# Patient Record
Sex: Male | Born: 1935 | Race: White | Hispanic: No | Marital: Married | Smoking: Never smoker
Health system: Southern US, Community
[De-identification: ages and names within clinical notes are randomized; demographics above are authoritative.]

## PROBLEM LIST (undated history)

## (undated) DIAGNOSIS — E785 Hyperlipidemia, unspecified: Secondary | ICD-10-CM

## (undated) DIAGNOSIS — F0280 Dementia in other diseases classified elsewhere without behavioral disturbance: Secondary | ICD-10-CM

## (undated) DIAGNOSIS — F0390 Unspecified dementia without behavioral disturbance: Secondary | ICD-10-CM

## (undated) DIAGNOSIS — I4891 Unspecified atrial fibrillation: Secondary | ICD-10-CM

## (undated) DIAGNOSIS — Z860100 Personal history of colon polyps, unspecified: Secondary | ICD-10-CM

## (undated) DIAGNOSIS — I10 Essential (primary) hypertension: Secondary | ICD-10-CM

## (undated) DIAGNOSIS — D696 Thrombocytopenia, unspecified: Secondary | ICD-10-CM

## (undated) DIAGNOSIS — N189 Chronic kidney disease, unspecified: Secondary | ICD-10-CM

## (undated) DIAGNOSIS — E039 Hypothyroidism, unspecified: Secondary | ICD-10-CM

## (undated) DIAGNOSIS — N400 Enlarged prostate without lower urinary tract symptoms: Secondary | ICD-10-CM

## (undated) DIAGNOSIS — G309 Alzheimer's disease, unspecified: Secondary | ICD-10-CM

## (undated) HISTORY — DX: Personal history of colon polyps, unspecified: Z86.0100

## (undated) HISTORY — DX: Unspecified atrial fibrillation: I48.91

## (undated) HISTORY — DX: Hyperlipidemia, unspecified: E78.5

## (undated) HISTORY — DX: Personal history of colonic polyps: Z86.010

## (undated) HISTORY — DX: Unspecified dementia, unspecified severity, without behavioral disturbance, psychotic disturbance, mood disturbance, and anxiety: F03.90

## (undated) HISTORY — DX: Hypothyroidism, unspecified: E03.9

## (undated) HISTORY — DX: Benign prostatic hyperplasia without lower urinary tract symptoms: N40.0

## (undated) HISTORY — PX: APPENDECTOMY: SHX54

## (undated) HISTORY — DX: Essential (primary) hypertension: I10

## (undated) HISTORY — PX: PROSTATECTOMY: SHX69

## (undated) HISTORY — DX: Thrombocytopenia, unspecified: D69.6

---

## 1997-11-30 ENCOUNTER — Ambulatory Visit (HOSPITAL_COMMUNITY): Admission: RE | Admit: 1997-11-30 | Discharge: 1997-11-30 | Payer: Self-pay | Admitting: Neurosurgery

## 1997-12-14 ENCOUNTER — Ambulatory Visit (HOSPITAL_COMMUNITY): Admission: RE | Admit: 1997-12-14 | Discharge: 1997-12-14 | Payer: Self-pay | Admitting: Neurosurgery

## 1998-01-04 ENCOUNTER — Ambulatory Visit (HOSPITAL_COMMUNITY): Admission: RE | Admit: 1998-01-04 | Discharge: 1998-01-04 | Payer: Self-pay | Admitting: Family Medicine

## 1998-01-15 ENCOUNTER — Encounter: Admission: RE | Admit: 1998-01-15 | Discharge: 1998-04-15 | Payer: Self-pay | Admitting: Family Medicine

## 1998-01-20 ENCOUNTER — Ambulatory Visit (HOSPITAL_COMMUNITY): Admission: RE | Admit: 1998-01-20 | Discharge: 1998-01-20 | Payer: Self-pay | Admitting: Gastroenterology

## 2001-01-16 ENCOUNTER — Encounter: Admission: RE | Admit: 2001-01-16 | Discharge: 2001-01-16 | Payer: Self-pay | Admitting: Family Medicine

## 2001-01-16 ENCOUNTER — Encounter: Payer: Self-pay | Admitting: Family Medicine

## 2002-02-08 ENCOUNTER — Inpatient Hospital Stay (HOSPITAL_COMMUNITY): Admission: EM | Admit: 2002-02-08 | Discharge: 2002-02-11 | Payer: Self-pay | Admitting: Emergency Medicine

## 2002-02-09 ENCOUNTER — Encounter: Payer: Self-pay | Admitting: Family Medicine

## 2002-02-10 ENCOUNTER — Encounter: Payer: Self-pay | Admitting: Family Medicine

## 2003-06-20 LAB — HM COLONOSCOPY

## 2003-11-01 ENCOUNTER — Emergency Department (HOSPITAL_COMMUNITY): Admission: EM | Admit: 2003-11-01 | Discharge: 2003-11-01 | Payer: Self-pay | Admitting: Emergency Medicine

## 2007-11-13 ENCOUNTER — Encounter: Admission: RE | Admit: 2007-11-13 | Discharge: 2007-11-13 | Payer: Self-pay | Admitting: Family Medicine

## 2008-08-26 ENCOUNTER — Encounter: Payer: Self-pay | Admitting: Internal Medicine

## 2008-10-07 ENCOUNTER — Encounter: Payer: Self-pay | Admitting: Internal Medicine

## 2008-10-21 ENCOUNTER — Encounter: Payer: Self-pay | Admitting: Internal Medicine

## 2008-12-21 ENCOUNTER — Ambulatory Visit: Payer: Self-pay | Admitting: Cardiology

## 2008-12-21 ENCOUNTER — Ambulatory Visit: Payer: Self-pay | Admitting: Critical Care Medicine

## 2008-12-21 ENCOUNTER — Inpatient Hospital Stay (HOSPITAL_COMMUNITY): Admission: EM | Admit: 2008-12-21 | Discharge: 2008-12-28 | Payer: Self-pay | Admitting: Emergency Medicine

## 2008-12-23 ENCOUNTER — Encounter: Payer: Self-pay | Admitting: Cardiology

## 2008-12-24 ENCOUNTER — Encounter: Payer: Self-pay | Admitting: Cardiology

## 2009-01-02 ENCOUNTER — Emergency Department (HOSPITAL_COMMUNITY): Admission: EM | Admit: 2009-01-02 | Discharge: 2009-01-02 | Payer: Self-pay | Admitting: Emergency Medicine

## 2009-02-18 ENCOUNTER — Encounter (INDEPENDENT_AMBULATORY_CARE_PROVIDER_SITE_OTHER): Payer: Self-pay | Admitting: Urology

## 2009-02-18 ENCOUNTER — Inpatient Hospital Stay (HOSPITAL_COMMUNITY): Admission: RE | Admit: 2009-02-18 | Discharge: 2009-02-20 | Payer: Self-pay | Admitting: Urology

## 2009-06-08 ENCOUNTER — Encounter: Payer: Self-pay | Admitting: Internal Medicine

## 2009-06-09 ENCOUNTER — Ambulatory Visit: Payer: Self-pay | Admitting: Internal Medicine

## 2009-06-09 DIAGNOSIS — Z860100 Personal history of colon polyps, unspecified: Secondary | ICD-10-CM

## 2009-06-09 DIAGNOSIS — G309 Alzheimer's disease, unspecified: Secondary | ICD-10-CM

## 2009-06-09 DIAGNOSIS — I4891 Unspecified atrial fibrillation: Secondary | ICD-10-CM

## 2009-06-09 DIAGNOSIS — F0390 Unspecified dementia without behavioral disturbance: Secondary | ICD-10-CM | POA: Insufficient documentation

## 2009-06-09 DIAGNOSIS — E785 Hyperlipidemia, unspecified: Secondary | ICD-10-CM | POA: Insufficient documentation

## 2009-06-09 DIAGNOSIS — E119 Type 2 diabetes mellitus without complications: Secondary | ICD-10-CM | POA: Insufficient documentation

## 2009-06-09 DIAGNOSIS — I10 Essential (primary) hypertension: Secondary | ICD-10-CM

## 2009-06-09 DIAGNOSIS — F0280 Dementia in other diseases classified elsewhere without behavioral disturbance: Secondary | ICD-10-CM | POA: Insufficient documentation

## 2009-06-09 HISTORY — DX: Personal history of colonic polyps: Z86.010

## 2009-06-09 HISTORY — DX: Personal history of colon polyps, unspecified: Z86.0100

## 2009-06-09 LAB — CONVERTED CEMR LAB
BUN: 17 mg/dL (ref 6–23)
Basophils Relative: 0.8 % (ref 0.0–3.0)
Bilirubin, Direct: 0.1 mg/dL (ref 0.0–0.3)
Chloride: 107 meq/L (ref 96–112)
Cholesterol: 155 mg/dL (ref 0–200)
Eosinophils Relative: 2.5 % (ref 0.0–5.0)
HCT: 44.3 % (ref 39.0–52.0)
LDL Cholesterol: 74 mg/dL (ref 0–99)
Lymphs Abs: 1.2 10*3/uL (ref 0.7–4.0)
MCV: 94.4 fL (ref 78.0–100.0)
Monocytes Absolute: 0.3 10*3/uL (ref 0.1–1.0)
Platelets: 109 10*3/uL — ABNORMAL LOW (ref 150.0–400.0)
Potassium: 4.1 meq/L (ref 3.5–5.1)
Total Bilirubin: 1 mg/dL (ref 0.3–1.2)
Total CHOL/HDL Ratio: 3
Total Protein: 6.5 g/dL (ref 6.0–8.3)
VLDL: 21.8 mg/dL (ref 0.0–40.0)
WBC: 5 10*3/uL (ref 4.5–10.5)

## 2009-06-14 ENCOUNTER — Encounter: Payer: Self-pay | Admitting: Internal Medicine

## 2009-06-16 ENCOUNTER — Telehealth: Payer: Self-pay | Admitting: Internal Medicine

## 2009-07-02 ENCOUNTER — Ambulatory Visit: Payer: Self-pay | Admitting: Internal Medicine

## 2009-09-23 ENCOUNTER — Ambulatory Visit: Payer: Self-pay | Admitting: Internal Medicine

## 2009-09-23 LAB — CONVERTED CEMR LAB
AST: 21 units/L (ref 0–37)
Alkaline Phosphatase: 60 units/L (ref 39–117)
GFR calc non Af Amer: 56.37 mL/min (ref >60)
LDL Cholesterol: 61 mg/dL (ref 0–99)
Potassium: 4 meq/L (ref 3.5–5.1)
Sodium: 145 meq/L (ref 135–145)
Total Bilirubin: 0.7 mg/dL (ref 0.3–1.2)
Total CHOL/HDL Ratio: 2
VLDL: 20.2 mg/dL (ref 0.0–40.0)

## 2009-09-30 ENCOUNTER — Ambulatory Visit: Payer: Self-pay | Admitting: Internal Medicine

## 2010-01-27 ENCOUNTER — Ambulatory Visit: Payer: Self-pay | Admitting: Internal Medicine

## 2010-01-27 LAB — CONVERTED CEMR LAB
Albumin: 4 g/dL (ref 3.5–5.2)
Alkaline Phosphatase: 66 units/L (ref 39–117)
Bilirubin, Direct: 0.2 mg/dL (ref 0.0–0.3)
Calcium: 9.2 mg/dL (ref 8.4–10.5)
GFR calc non Af Amer: 64.24 mL/min (ref >60)
Glucose, Bld: 183 mg/dL — ABNORMAL HIGH (ref 70–99)
HDL: 51.2 mg/dL (ref >39.00)
Sodium: 145 meq/L (ref 135–145)
Total CHOL/HDL Ratio: 3
VLDL: 26.6 mg/dL (ref 0.0–40.0)

## 2010-02-03 ENCOUNTER — Ambulatory Visit: Payer: Self-pay | Admitting: Internal Medicine

## 2010-02-26 ENCOUNTER — Emergency Department (HOSPITAL_COMMUNITY): Admission: EM | Admit: 2010-02-26 | Discharge: 2010-02-26 | Payer: Self-pay | Admitting: Emergency Medicine

## 2010-03-07 ENCOUNTER — Emergency Department (HOSPITAL_COMMUNITY)
Admission: EM | Admit: 2010-03-07 | Discharge: 2010-03-07 | Payer: Self-pay | Source: Home / Self Care | Admitting: Family Medicine

## 2010-04-28 ENCOUNTER — Ambulatory Visit: Payer: Self-pay | Admitting: Internal Medicine

## 2010-04-28 LAB — CONVERTED CEMR LAB
AST: 25 units/L (ref 0–37)
Albumin: 4.2 g/dL (ref 3.5–5.2)
CO2: 28 meq/L (ref 19–32)
Calcium: 9.6 mg/dL (ref 8.4–10.5)
Cholesterol: 122 mg/dL (ref 0–200)
GFR calc non Af Amer: 55.79 mL/min (ref >60)
HDL: 45.6 mg/dL (ref >39.00)
Hgb A1c MFr Bld: 6 % (ref 4.6–6.5)
Sodium: 146 meq/L — ABNORMAL HIGH (ref 135–145)
Total CHOL/HDL Ratio: 3
Total Protein: 6.2 g/dL (ref 6.0–8.3)
Triglycerides: 93 mg/dL (ref 0.0–149.0)

## 2010-05-05 ENCOUNTER — Ambulatory Visit: Payer: Self-pay | Admitting: Internal Medicine

## 2010-07-21 NOTE — Assessment & Plan Note (Signed)
Summary: 4 month fup//ccm   Vital Signs:  Patient profile:   77 year old male Weight:      217 pounds Temp:     97.8 degrees F oral Pulse rate:   64 / minute Pulse rhythm:   regular Resp:     12 per minute BP sitting:   132 / 84  (left arm) Cuff size:   regular  Vitals Entered By: Gladis Riffle, RN (February 03, 2010 10:01 AM) CC: 4 month rov-- Is Patient Diabetic? Yes Did you bring your meter with you today? No Comments wife advised to take control of medications and use daily pill box--CBGs 250 average at home   CC:  4 month rov--.  History of Present Illness:  Follow-Up Visit      This is an Robert Salinas who presents for Follow-up visit.  The patient denies chest pain and palpitations.  Since the last visit the patient notes no new problems or concerns.  The patient reports not taking meds as prescribed.  When questioned about possible medication side effects, the patient notes none.  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 Take 1 Tablet By Mouth Two Times A Day 2)  Lisinopril 20 Mg Tabs (Lisinopril) .Marland Kitchen.. 1 Tablet By Mouth Daily 3)  Synthroid 75 Mcg Tabs (Levothyroxine Sodium) .Marland Kitchen.. 1 By Mouth Once Daily 4)  Lipitor 10 Mg Tabs (Atorvastatin Calcium) .Marland Kitchen.. 1 By Mouth  Daily 5)  Aricept 5 Mg Tabs (Donepezil Hydrochloride) .Marland Kitchen.. 1 By Mouth Daily 6)  Aleve 220 Mg Tabs (Naproxen Sodium) .... As Needed 7)  Glimepiride 2 Mg Tabs (Glimepiride) .... Take 1 Tablet By Mouth Once A Day  Allergies (verified): No Known Drug Allergies  Past History:  Past Medical History: Last updated: 06/09/2009 Chickenpox Colonic polyps, hx of Diabetes mellitus, type II Hyperlipidemia Hypertension Early stages of Alzheimers  1. Escherichia coli bacteremia with initial positive blood culture on       December 21, 2008, and negative repeat blood culture x2 on December 25, 2008.   2. Escherichia coli urinary tract infection.   3. Urinary retention .    atrial fibrillation that evaluated by Cardiology       and the patient does not need Coumadin at this time.   5. Hypothyroidism.   7. Thrombocytopenia with no evidence of bleeding.   10.Dementia remains stable.   11.Benign prostatic hypertrophy.   12.Diabetes mellitus.   13.Hypertension.   14.Hyperlipidemia Atrial fibrillation  Past Surgical History: Last updated: 06/09/2009 Appendectomy Prostatectomy-TURP  Family History: Last updated: 06/09/2009 Family History Breast cancer 1st degree relative <50 Family History Diabetes 1st degree relative Family History High cholesterol Family History Hypertension  Social History: Last updated: 06/09/2009 Retired Married Never Smoked Alcohol use-no Drug use-no Regular exercise-no  Risk Factors: Exercise: no (06/09/2009)  Risk Factors: Smoking Status: never (02/03/2010)  Physical Exam  General:  Well-developed,well-nourished,in no acute distress; alert,appropriate and cooperative throughout examination Head:  normocephalic and atraumatic.   Eyes:  pupils equal and pupils round.   Ears:  R ear normal and L ear normal.   Neck:  No deformities, masses, or tenderness noted. Lungs:  normal respiratory effort and no intercostal retractions.   Heart:  normal rate and regular  rhythm.   Abdomen:  soft and non-tender.   Msk:  No deformity or scoliosis noted of thoracic or lumbar spine.   Neurologic:  cranial nerves II-XII intact and gait normal.   Skin:  turgor normal and color normal.     Impression & Recommendations:  Problem # 1:  ATRIAL FIBRILLATION (ICD-427.31) on exam he is in sinus rhtym not a candidate for anticoagualtion---confuses medications advised asa qd  Problem # 2:  HYPERLIPIDEMIA (ICD-272.4) controlled continue current medications  His updated medication list for this problem includes:    Lipitor 10 Mg Tabs (Atorvastatin calcium) .Marland Kitchen... 1 by mouth daily  Labs Reviewed: SGOT: 19 (01/27/2010)   SGPT: 15 (01/27/2010)   HDL:51.20 (01/27/2010), 57.60 (09/23/2009)  LDL:98 (01/27/2010), 61 (09/23/2009)  Chol:176 (01/27/2010), 139 (09/23/2009)  Trig:133.0 (01/27/2010), 101.0 (09/23/2009)  Problem # 3:  DIABETES MELLITUS, TYPE II (ICD-250.00) controlled continue current medications  His updated medication list for this problem includes:    Metformin Hcl 1000 Mg Tabs (Metformin hcl) .Marland Kitchen... Take 1 tablet by mouth two times a day    Lisinopril 20 Mg Tabs (Lisinopril) .Marland Kitchen... 1 tablet by mouth daily    Glimepiride 2 Mg Tabs (Glimepiride) .Marland Kitchen... Take 1 tablet by mouth once a day  Labs Reviewed: Creat: 1.2 (01/27/2010)    Reviewed HgBA1c results: 7.2 (01/27/2010)  6.7 (09/23/2009)  Complete Medication List: 1)  Metformin Hcl 1000 Mg Tabs (Metformin hcl) .... Take 1 tablet by mouth two times a day 2)  Lisinopril 20 Mg Tabs (Lisinopril) .Marland Kitchen.. 1 tablet by mouth daily 3)  Synthroid 75 Mcg Tabs (Levothyroxine sodium) .Marland Kitchen.. 1 by mouth once daily 4)  Lipitor 10 Mg Tabs (Atorvastatin calcium) .Marland Kitchen.. 1 by mouth daily 5)  Aricept 5 Mg Tabs (Donepezil hydrochloride) .Marland Kitchen.. 1 by mouth daily 6)  Aleve 220 Mg Tabs (Naproxen sodium) .... As needed 7)  Glimepiride 2 Mg Tabs (Glimepiride) .... Take 1 tablet by mouth once a day  Patient Instructions: 1)  Please schedule  a follow-up appointment in 3 months. 2)  labs one week prior to visit 3)  lipids---272.4 4)  lfts-995.2 5)  bmet-995.2 6)  A1C-250.02 7)    8)  tsh--244.9 Prescriptions: LIPITOR 10 MG TABS (ATORVASTATIN CALCIUM) 1 by mouth daily  #90 x 3   Entered and Authorized by:   Birdie Sons MD   Signed by:   Birdie Sons MD on 02/03/2010   Method used:   Faxed to ...       [facility] (mail-order)             , [place]         Ph: [phone]       Fax: [phone]   RxID:   [id number] SYNTHROID 75 MCG TABS (LEVOTHYROXINE SODIUM) 1 by mouth once daily  #90 x 3   Entered and Authorized by:   Birdie Sons MD   Signed by:   Birdie Sons MD on 02/03/2010   Method used:   Faxed to ...       [facility] (mail-order)             ,  [place]         Ph: [phone]       Fax: [phone]   RxID:   [id number]

## 2010-07-21 NOTE — Letter (Signed)
Summary: [facility]  [facility]   Imported By: Maryln Gottron 08/02/2009 15:38:58  _____________________________________________________________________  External Attachment:    Type:   Image     Comment:   External Document

## 2010-07-21 NOTE — Assessment & Plan Note (Signed)
Summary: 3 month fup///cm   Vital Signs:  Patient profile:   77 year old male Weight:      219 pounds Temp:     97.8 degrees F oral Pulse rate:   68 / minute Pulse rhythm:   regular BP sitting:   136 / 88  (left arm) Cuff size:   large  Vitals Entered By: Alfred Levins, CMA (May 05, 2010 9:40 AM) CC: f/u on labs   CC:  f/u on labs.  History of Present Illness:  Follow-Up Visit: patient is here with wife      This is an 11 year old man who presents for Follow-up visit.  The patient denies chest pain and palpitations.  Since the last visit the patient notes no new problems or concerns.  The patient reports taking meds as prescribed.  When questioned about possible medication side effects, the patient notes none.    no other complaints in a complete review of systems  Current Medications (verified): 1)  Metformin Hcl 1000 Mg Tabs (Metformin Hcl) .... Take 1 Tablet By Mouth Two Times A Day 2)  Lisinopril 20 Mg Tabs (Lisinopril) .Marland Kitchen.. 1 Tablet By Mouth Daily 3)  Synthroid 75 Mcg Tabs (Levothyroxine Sodium) .Marland Kitchen.. 1 By Mouth Once Daily 4)  Lipitor 10 Mg Tabs (Atorvastatin Calcium) .Marland Kitchen.. 1 By Mouth Daily 5)  Aricept 5 Mg Tabs (Donepezil Hydrochloride) .Marland Kitchen.. 1 By Mouth Daily 6)  Aleve 220 Mg Tabs (Naproxen Sodium) .... As Needed 7)  Glimepiride 2 Mg Tabs (Glimepiride) .... Take 1 Tablet By Mouth Once A Day  Allergies (verified): No Known Drug Allergies  Past History:  Past Medical History: Last updated: 06/09/2009 Chickenpox Colonic polyps, hx of Diabetes mellitus, type II Hyperlipidemia Hypertension Early stages of Alzheimers  1. Escherichia coli bacteremia with initial positive blood culture on       December 21, 2008, and negative repeat blood culture x2 on December 25, 2008.   2. Escherichia coli urinary tract infection.   3. Urinary retention .    atrial fibrillation that evaluated by Cardiology       and the patient does not need Coumadin at this time.   5. Hypothyroidism.   7. Thrombocytopenia with no evidence of bleeding.   10.Dementia remains stable.   11.Benign prostatic hypertrophy.   12.Diabetes mellitus.   13.Hypertension.   14.Hyperlipidemia Atrial fibrillation  Past Surgical History: Last updated: 06/09/2009 Appendectomy Prostatectomy-TURP  Family History: Last updated: 06/09/2009 Family History Breast cancer 1st degree relative <50 Family History Diabetes 1st degree relative Family History High cholesterol Family History Hypertension  Social History: Last updated: 06/09/2009 Retired Married Never Smoked Alcohol use-no Drug use-no Regular exercise-no  Risk Factors: Exercise: no (06/09/2009)  Risk Factors: Smoking Status: never (02/03/2010)  Physical Exam  General:  elderly male in no acute distress. HEENT exam atraumatic, normocephalic, neck supple without lymphadenopathy chest clear to auscultation cardiac exam S1-S2 are regular. Abdominal exam overweight, to bowel sounds, soft. Extremities there is no clubbing cyanosis or edema. Neurologic exam is alert no motor or sensory deficits identified.   Impression & Recommendations:  Problem # 1:  ATRIAL FIBRILLATION (ICD-427.31)  His updated medication list for this problem includes:    Aspirin 81 Mg Tbec (Aspirin) ..... One by mouth every day  on exam he is in sinus rhtym not a candidate for anticoagualtion---confuses medications advised asa qd  Problem # 2:  HYPERTENSION (ICD-401.9) adequate control. Continue current medications. His updated medication list for this problem includes:  Lisinopril 20 Mg Tabs (Lisinopril) .Marland Kitchen... 1 tablet by mouth daily  BP today: 136/88 Prior BP: 132/84 (02/03/2010)  Labs Reviewed: K+: 4.7 (04/28/2010) Creat: : 1.3 (04/28/2010)   Chol: 122 (04/28/2010)   HDL: 45.60 (04/28/2010)   LDL: 58 (04/28/2010)   TG: 93.0 (04/28/2010)  Problem # 3:  HYPERLIPIDEMIA (ICD-272.4) adequate control. Continue current medications. His updated medication  list for this problem includes:    Lipitor 10 Mg Tabs (Atorvastatin calcium) .Marland Kitchen... 1 by mouth daily  Labs Reviewed: SGOT: 25 (04/28/2010)   SGPT: 18 (04/28/2010)   HDL:45.60 (04/28/2010), 51.20 (01/27/2010)  LDL:58 (04/28/2010), 98 (01/27/2010)  Chol:122 (04/28/2010), 176 (01/27/2010)  Trig:93.0 (04/28/2010), 133.0 (01/27/2010)  Problem # 4:  DIABETES MELLITUS, TYPE II (ICD-250.00) controlled. Continue current medications. His updated medication list for this problem includes:    Metformin Hcl 1000 Mg Tabs (Metformin hcl) .Marland Kitchen... Take 1 tablet by mouth two times a day    Lisinopril 20 Mg Tabs (Lisinopril) .Marland Kitchen... 1 tablet by mouth daily    Glimepiride 2 Mg Tabs (Glimepiride) .Marland Kitchen... Take 1 tablet by mouth once a day    Aspirin 81 Mg Tbec (Aspirin) ..... One by mouth every day  Labs Reviewed: Creat: 1.3 (04/28/2010)    Reviewed HgBA1c results: 6.0 (04/28/2010)  7.2 (01/27/2010)  Complete Medication List: 1)  Metformin Hcl 1000 Mg Tabs (Metformin hcl) .... Take 1 tablet by mouth two times a day 2)  Lisinopril 20 Mg Tabs (Lisinopril) .Marland Kitchen.. 1 tablet by mouth daily 3)  Synthroid 75 Mcg Tabs (Levothyroxine sodium) .Marland Kitchen.. 1 by mouth once daily 4)  Lipitor 10 Mg Tabs (Atorvastatin calcium) .Marland Kitchen.. 1 by mouth daily 5)  Aricept 5 Mg Tabs (Donepezil hydrochloride) .Marland Kitchen.. 1 by mouth daily 6)  Aleve 220 Mg Tabs (Naproxen sodium) .... As needed 7)  Glimepiride 2 Mg Tabs (Glimepiride) .... Take 1 tablet by mouth once a day 8)  Aspirin 81 Mg Tbec (Aspirin) .... One by mouth every day  Patient Instructions: 1)  Please schedule a follow-up appointment in 4 months. 2)  labs one week prior to visit 3)  lipids---272.4 4)  lfts-995.2 5)  bmet-995.2 6)  A1C-250.02 7)       Orders Added: 1)  Est. Patient Level IV [04540]

## 2010-07-21 NOTE — Op Note (Signed)
Summary: Ultrasound and Needle Biopsy of Prostate/[facility]  Ultrasound and Needle Biopsy of Prostate/[facility]   Imported By: Maryln Gottron 08/02/2009 15:40:36  _____________________________________________________________________  External Attachment:    Type:   Image     Comment:   External Document

## 2010-07-21 NOTE — Letter (Signed)
Summary: [facility]  [facility]   Imported By: Maryln Gottron 07/01/2009 12:47:34  _____________________________________________________________________  External Attachment:    Type:   Image     Comment:   External Document

## 2010-07-21 NOTE — Assessment & Plan Note (Signed)
Summary: 3 wk rov/njr rsc bmp per wife/njr   Vital Signs:  Patient profile:   77 year old male Weight:      206 pounds Temp:     97.7 degrees F Pulse rate:   68 / minute Resp:     12 per minute BP sitting:   162 / 70  (left arm)  Vitals Entered By: Gladis Riffle, RN (July 02, 2009 1:48 PM)  Serial Vital Signs/Assessments:  Time      Position  BP       Pulse  Resp  Temp     By                     134/78                         Birdie Sons MD    Preventive Screening-Counseling & Management  Alcohol-Tobacco     Smoking Status: never  Current Problems (verified): 1)  Atrial Fibrillation  (ICD-427.31) 2)  Alzheimer's Disease, 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 Take 1 Tablet By Mouth Two Times A Day 2)  Lisinopril 10 Mg  Tabs (Lisinopril) .... Take 1 Tab By Mouth Daily 3)  Synthroid 75 Mcg Tabs (Levothyroxine Sodium) .Marland Kitchen.. 1 By Mouth Once Daily 4)  Lipitor 10 Mg Tabs (Atorvastatin Calcium) .Marland Kitchen.. 1 By Mouth Daily 5)  Aricept 5 Mg Tabs (Donepezil Hydrochloride) .Marland Kitchen.. 1 By Mouth Daily 6)  Aleve 220 Mg Tabs (Naproxen Sodium) .... As Needed  Allergies (verified): No Known Drug Allergies  Comments:  Nurse/Medical Assistant: 3 week rov, labs done--BP 135/70 at home  The patient's medications and allergies were reviewed with the patient and were updated in the Medication and Allergy Lists. Gladis Riffle, RN (July 02, 2009 1:49 PM)  Past History:  Past Medical History: Last updated: 06/09/2009 Chickenpox Colonic polyps, hx of Diabetes mellitus, type II Hyperlipidemia Hypertension Early stages of Alzheimers  1. Escherichia coli  bacteremia with initial positive blood culture on       December 21, 2008, and negative repeat blood culture x2 on December 25, 2008.   2. Escherichia coli urinary tract infection.   3. Urinary retention .    atrial fibrillation that evaluated by Cardiology       and the patient does not need Coumadin at this time.   5. Hypothyroidism.   7. Thrombocytopenia with no evidence of bleeding.   10.Dementia remains stable.   11.Benign prostatic hypertrophy.   12.Diabetes mellitus.   13.Hypertension.   14.Hyperlipidemia Atrial fibrillation  Past Surgical History: Last updated: 06/09/2009 Appendectomy Prostatectomy-TURP  Family History: Last updated: 06/09/2009 Family History Breast cancer 1st degree relative <50 Family History Diabetes 1st degree relative Family History High cholesterol Family History Hypertension  Social History: Last updated: 06/09/2009 Retired Married Never Smoked Alcohol use-no Drug use-no Regular exercise-no  Risk Factors: Exercise: no (06/09/2009)  Risk Factors: Smoking Status: never (07/02/2009)  Review of Systems       All other systems reviewed and were negative   Physical Exam  General:  Well-developed,well-nourished,in no acute distress; alert,appropriate and cooperative throughout examination Head:  normocephalic and atraumatic.   Eyes:  pupils equal and pupils round.   Ears:  R ear normal and L ear normal.   Neck:  No deformities, masses, or tenderness noted. Chest Wall:  No deformities, masses, tenderness or gynecomastia noted. Lungs:  Normal respiratory effort, chest expands symmetrically. Lungs are clear to auscultation, no crackles or wheezes. Abdomen:  Bowel sounds positive,abdomen soft and non-tender without masses, organomegaly or hernias noted. Msk:  No deformity or scoliosis noted of thoracic or lumbar spine.   Neurologic:  cranial nerves II-XII intact and gait normal.     Impression & Recommendations:  Problem # 1:  HYPERTENSION  (ICD-401.9) he will begin to monitor His updated medication list for this problem includes:    Lisinopril 10 Mg Tabs (Lisinopril) .Marland Kitchen... Take 1 tab by mouth daily  BP today: 162/70 Prior BP: 178/90 (06/09/2009)  Labs Reviewed: K+: 4.1 (06/09/2009) Creat: : 1.3 (06/09/2009)   Chol: 155 (06/09/2009)   HDL: 59.00 (06/09/2009)   LDL: 74 (06/09/2009)   TG: 109.0 (06/09/2009)  Problem # 2:  HYPERLIPIDEMIA (ICD-272.4) adequate control His updated medication list for this problem includes:    Lipitor 10 Mg Tabs (Atorvastatin calcium) .Marland Kitchen... 1 by mouth daily  Labs Reviewed: SGOT: 19 (06/09/2009)   SGPT: 15 (06/09/2009)   HDL:59.00 (06/09/2009)  LDL:74 (06/09/2009)  Chol:155 (06/09/2009)  Trig:109.0 (06/09/2009)  Problem # 3:  DIABETES MELLITUS, TYPE II (ICD-250.00) controlled continue current medications  His updated medication list for this problem includes:    Metformin Hcl 1000 Mg Tabs (Metformin hcl) .Marland Kitchen... Take 1 tablet by mouth two times a day    Lisinopril 10 Mg Tabs (Lisinopril) .Marland Kitchen... Take 1 tab by mouth daily  Labs Reviewed: Creat: 1.3 (06/09/2009)    Reviewed HgBA1c results: 6.4 (06/09/2009)  Problem # 4:  ALZHEIMER'S DISEASE, EARLY (ICD-331.0) stable  Complete Medication List: 1)  Metformin Hcl 1000 Mg Tabs (Metformin hcl) .... Take 1 tablet by mouth two times a day 2)  Lisinopril 10 Mg Tabs (Lisinopril) .... Take 1 tab by mouth daily 3)  Synthroid 75 Mcg Tabs (Levothyroxine sodium) .Marland Kitchen.. 1 by mouth once daily 4)  Lipitor 10 Mg Tabs (Atorvastatin calcium) .Marland Kitchen.. 1 by mouth daily 5)  Aricept 5 Mg Tabs (Donepezil hydrochloride) .Marland Kitchen.. 1 by mouth daily 6)  Aleve 220 Mg Tabs (Naproxen sodium) .... As needed  Patient Instructions: 1)  Please schedule a follow-up appointment in 3 months. 2)  labs one week prior to visit 3)  lipids---272.4 4)  lfts-995.2 5)  bmet-995.2 6)  A1C-250.02 7)     Prescriptions: LISINOPRIL 10 MG  TABS (LISINOPRIL) Take 1 tab by mouth daily  #90 x 3    Entered by:   Gladis Riffle, RN   Authorized by:   Birdie Sons MD   Signed by:   Gladis Riffle, RN on 07/12/2009   Method used:   Electronically to        [facility] (mail-order)             ,          Ph: [phone]       Fax: [phone]   RxID:   [id number]

## 2010-07-21 NOTE — Assessment & Plan Note (Signed)
Summary: 3 month rov/njr   Vital Signs:  Patient profile:   77 year old male Weight:      212 pounds BMI:     30.10 Temp:     97.7 degrees F oral Pulse rate:   64 / minute Pulse rhythm:   regular Resp:     14 per minute BP sitting:   172 / 60  (left arm) Cuff size:   regular  Vitals Entered By: Gladis Riffle, RN (September 30, 2009 9:56 AM) CC: 3 month rov, labs done Is Patient Diabetic? No   CC:  3 month rov and labs done.  History of Present Illness:  Follow-Up Visit      This is an 77 year old man who presents for Follow-up visit.  The patient denies chest pain and palpitations.  Since the last visit the patient notes no new problems or concerns.  The patient reports taking meds as prescribed.  When questioned about possible medication side effects, the patient notes none.  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 Take 1 Tablet By Mouth Two Times A Day 2)  Lisinopril 10 Mg  Tabs (Lisinopril) .... Take 1 Tab By Mouth Daily 3)  Synthroid 75 Mcg Tabs (Levothyroxine Sodium) .Marland Kitchen.. 1 By Mouth Once Daily 4)  Lipitor 10 Mg Tabs (Atorvastatin Calcium) .Marland Kitchen.. 1 By Mouth Daily 5)  Aricept 5 Mg Tabs (Donepezil Hydrochloride) .Marland Kitchen.. 1 By Mouth Daily 6)  Aleve 220 Mg Tabs (Naproxen Sodium) .... As Needed  Allergies (verified): No Known Drug Allergies  Past History:  Past Medical History: Last updated:  06/09/2009 Chickenpox Colonic polyps, hx of Diabetes mellitus, type II Hyperlipidemia Hypertension Early stages of Alzheimers  1. Escherichia coli bacteremia with initial positive blood culture on       December 21, 2008, and negative repeat blood culture x2 on December 25, 2008.   2. Escherichia coli urinary tract infection.   3. Urinary retention .    atrial fibrillation that evaluated by Cardiology       and the patient does not need Coumadin at this time.   5. Hypothyroidism.   7. Thrombocytopenia with no evidence of bleeding.   10.Dementia remains stable.   11.Benign prostatic hypertrophy.   12.Diabetes mellitus.   13.Hypertension.   14.Hyperlipidemia Atrial fibrillation  Past Surgical History: Last updated: 06/09/2009 Appendectomy Prostatectomy-TURP  Family History: Last updated: 06/09/2009 Family History Breast cancer 1st degree relative <50 Family History Diabetes 1st degree relative Family History High cholesterol Family History Hypertension  Social History: Last updated: 06/09/2009 Retired Married Never Smoked Alcohol use-no Drug use-no Regular exercise-no  Risk Factors: Exercise: no (06/09/2009)  Risk Factors: Smoking Status: never (09/30/2009)  Physical Exam  General:  Well-developed,well-nourished,in no acute distress; alert,appropriate and cooperative throughout examination Head:  normocephalic and atraumatic.   Eyes:  pupils equal and pupils round.   Ears:  R ear normal and L ear normal.   Neck:  No deformities,  masses, or tenderness noted. Chest Wall:  No deformities, masses, tenderness or gynecomastia noted. Lungs:  Normal respiratory effort, chest expands symmetrically. Lungs are clear to auscultation, no crackles or wheezes. Abdomen:  Bowel sounds positive,abdomen soft and non-tender without masses, organomegaly or hernias noted. Msk:  No deformity or scoliosis noted of thoracic or lumbar spine.   Neurologic:  cranial nerves II-XII intact and gait  normal.     Impression & Recommendations:  Problem # 1:  DIABETES MELLITUS, TYPE II (ICD-250.00) controlled continue current medications  His updated medication list for this problem includes:    Metformin Hcl 1000 Mg Tabs (Metformin hcl) .Marland Kitchen... Take 1 tablet by mouth two times a day    Lisinopril 20 Mg Tabs (Lisinopril) .Marland Kitchen... 1 tablet by mouth daily  Labs Reviewed: Creat: 1.3 (09/23/2009)    Reviewed HgBA1c results: 6.7 (09/23/2009)  6.4 (06/09/2009)  Problem # 2:  HYPERTENSION (ICD-401.9) Assessment: Deteriorated see new dose side effects discussed His updated medication list for this problem includes:    Lisinopril 20 Mg Tabs (Lisinopril) .Marland Kitchen... 1 tablet by mouth daily  BP today: 172/60 Prior BP: 162/70 (07/02/2009)  Labs Reviewed: K+: 4.0 (09/23/2009) Creat: : 1.3 (09/23/2009)   Chol: 139 (09/23/2009)   HDL: 57.60 (09/23/2009)   LDL: 61 (09/23/2009)   TG: 101.0 (09/23/2009)  Problem # 3:  ATRIAL FIBRILLATION (ICD-427.31) no recurrence of sxs  Problem # 4:  ALZHEIMER'S DISEASE, EARLY (ICD-331.0) tolerating meds memory is stable  Complete Medication List: 1)  Metformin Hcl 1000 Mg Tabs (Metformin hcl) .... Take 1 tablet by mouth two times a day 2)  Lisinopril 20 Mg Tabs (Lisinopril) .Marland Kitchen.. 1 tablet by mouth daily 3)  Synthroid 75 Mcg Tabs (Levothyroxine sodium) .Marland Kitchen.. 1 by mouth once daily 4)  Lipitor 10 Mg Tabs (Atorvastatin calcium) .Marland Kitchen.. 1 by mouth daily 5)  Aricept 5 Mg Tabs (Donepezil hydrochloride) .Marland Kitchen.. 1 by mouth daily 6)  Aleve 220 Mg Tabs (Naproxen sodium) .... As needed  Patient Instructions: 1)  increase lisinopril to 20 mg by mouth once daily. [facility] should be sending you a new prescription 2)  Please schedule a follow-up appointment in 4 months. 3)  labs one week prior to visit 4)  lipids---272.4 5)  lfts-995.2 6)  bmet-995.2 7)  A1C-250.02 8)     Prescriptions: LISINOPRIL 20 MG TABS (LISINOPRIL) 1 tablet by mouth daily  #90 x 3   Entered and Authorized  by:   Birdie Sons MD   Signed by:   Birdie Sons MD on 09/30/2009   Method used:   Electronically to        [facility]* (mail-order)             ,          Ph: [phone]       Fax: [phone]   RxID:   [id number]

## 2010-08-26 ENCOUNTER — Other Ambulatory Visit (INDEPENDENT_AMBULATORY_CARE_PROVIDER_SITE_OTHER): Payer: Medicare Other | Admitting: Internal Medicine

## 2010-08-26 DIAGNOSIS — T887XXA Unspecified adverse effect of drug or medicament, initial encounter: Secondary | ICD-10-CM

## 2010-08-26 DIAGNOSIS — E785 Hyperlipidemia, unspecified: Secondary | ICD-10-CM

## 2010-08-26 DIAGNOSIS — E119 Type 2 diabetes mellitus without complications: Secondary | ICD-10-CM

## 2010-08-26 LAB — LIPID PANEL
Cholesterol: 129 mg/dL (ref 0–200)
LDL Cholesterol: 55 mg/dL (ref 0–99)
Triglycerides: 97 mg/dL (ref 0.0–149.0)

## 2010-08-26 LAB — HEPATIC FUNCTION PANEL
ALT: 20 U/L (ref 0–53)
AST: 24 U/L (ref 0–37)
Albumin: 4.2 g/dL (ref 3.5–5.2)
Alkaline Phosphatase: 56 U/L (ref 39–117)
Bilirubin, Direct: 0.2 mg/dL (ref 0.0–0.3)
Total Protein: 6 g/dL (ref 6.0–8.3)

## 2010-08-26 LAB — BASIC METABOLIC PANEL
BUN: 15 mg/dL (ref 6–23)
Calcium: 9.5 mg/dL (ref 8.4–10.5)
Creatinine, Ser: 1.1 mg/dL (ref 0.4–1.5)

## 2010-08-26 LAB — HEMOGLOBIN A1C: Hgb A1c MFr Bld: 6.8 % — ABNORMAL HIGH (ref 4.6–6.5)

## 2010-09-02 ENCOUNTER — Encounter: Payer: Self-pay | Admitting: Internal Medicine

## 2010-09-02 ENCOUNTER — Ambulatory Visit: Payer: Self-pay | Admitting: Internal Medicine

## 2010-09-05 ENCOUNTER — Encounter: Payer: Self-pay | Admitting: Internal Medicine

## 2010-09-05 ENCOUNTER — Telehealth: Payer: Self-pay | Admitting: Family Medicine

## 2010-09-05 ENCOUNTER — Ambulatory Visit (INDEPENDENT_AMBULATORY_CARE_PROVIDER_SITE_OTHER): Payer: Medicare Other | Admitting: Internal Medicine

## 2010-09-05 VITALS — BP 144/80 | HR 76 | Temp 97.8°F | Wt 192.0 lb

## 2010-09-05 DIAGNOSIS — R5381 Other malaise: Secondary | ICD-10-CM

## 2010-09-05 DIAGNOSIS — I4891 Unspecified atrial fibrillation: Secondary | ICD-10-CM

## 2010-09-05 DIAGNOSIS — E119 Type 2 diabetes mellitus without complications: Secondary | ICD-10-CM

## 2010-09-05 DIAGNOSIS — R5383 Other fatigue: Secondary | ICD-10-CM

## 2010-09-05 LAB — CBC WITH DIFFERENTIAL/PLATELET
Basophils Absolute: 0 10*3/uL (ref 0.0–0.1)
Eosinophils Absolute: 0 10*3/uL (ref 0.0–0.7)
Lymphocytes Relative: 25.7 % (ref 12.0–46.0)
Lymphs Abs: 1.3 10*3/uL (ref 0.7–4.0)
MCHC: 34.3 g/dL (ref 30.0–36.0)
Monocytes Relative: 5.6 % (ref 3.0–12.0)
Neutro Abs: 3.5 10*3/uL (ref 1.4–7.7)
Platelets: 131 10*3/uL — ABNORMAL LOW (ref 150.0–400.0)
RDW: 14.1 % (ref 11.5–14.6)

## 2010-09-05 LAB — SEDIMENTATION RATE: Sed Rate: 7 mm/hr (ref 0–22)

## 2010-09-05 MED ORDER — METFORMIN HCL 1000 MG PO TABS: 500.0000 mg | ORAL_TABLET | Freq: Every day | ORAL | Status: DC

## 2010-09-05 NOTE — Progress Notes (Signed)
  Subjective:    Patient ID: Robert Salinas, male    DOB: 08-Sep-1926, 77 y.o.   MRN: [id number]  HPI  Main complaint is fatigue Note significant weight loss   patient comes in for followup of multiple medical problems including type 2 diabetes, hyperlipidemia, hypertension. The patient does not check blood sugar or blood pressure at home. The patetient does not follow an exercise or diet program. The patient denies any polyuria, polydipsia.  In the past the patient has gone to diabetic treatment center. The patient is tolerating medications  Without difficulty. The patient does admit to medication compliance.   Past Medical History  Diagnosis Date  . Chickenpox   . Hx of colonic polyps   . Diabetes mellitus   . Hyperlipidemia   . Hypertension   . Atrial fibrillation   . Hypothyroidism   . Thrombocytopenia   . Dementia   . BPH (benign prostatic hyperplasia)    Past Surgical History  Procedure Date  . Appendectomy   . Prostatectomy     reports that he has never smoked. He does not have any smokeless tobacco history on file. He reports that he does not drink alcohol or use illicit drugs. family history is not on file. No Known Allergies   Review of Systems  patient denies chest pain, shortness of breath, orthopnea. Denies lower extremity edema, abdominal pain, change in appetite, change in bowel movements. Patient denies rashes, musculoskeletal complaints. No other specific complaints in a complete review of systems. Complains of marked fatigue     Objective:   Physical Exam  well-developed well-nourished male in no acute distress. HEENT exam atraumatic, normocephalic, neck supple without jugular venous distention. Chest clear to auscultation cardiac exam S1-S2 are regular. Abdominal exam overweight with bowel sounds, soft and nontender. Extremities no edema. Neurologic exam is alert with a normal gait.        Assessment & Plan:

## 2010-09-05 NOTE — Assessment & Plan Note (Signed)
This seem to be main problem along with weight loss---probably best to dc as many meds as possible Check labs and f/u in 3 weeks.

## 2010-09-07 ENCOUNTER — Encounter: Payer: Self-pay | Admitting: Internal Medicine

## 2010-09-07 NOTE — Assessment & Plan Note (Signed)
No known recurrecne Continue ASA

## 2010-09-07 NOTE — Assessment & Plan Note (Signed)
Reviewed labs Note weight loss Unclear etiology---will dc as any meds as possible and f/u shortly

## 2010-09-23 LAB — GLUCOSE, CAPILLARY
Glucose-Capillary: 138 mg/dL — ABNORMAL HIGH (ref 70–99)
Glucose-Capillary: 143 mg/dL — ABNORMAL HIGH (ref 70–99)
Glucose-Capillary: 158 mg/dL — ABNORMAL HIGH (ref 70–99)
Glucose-Capillary: 160 mg/dL — ABNORMAL HIGH (ref 70–99)
Glucose-Capillary: 161 mg/dL — ABNORMAL HIGH (ref 70–99)
Glucose-Capillary: 168 mg/dL — ABNORMAL HIGH (ref 70–99)

## 2010-09-23 LAB — HEMOGLOBIN AND HEMATOCRIT, BLOOD: Hemoglobin: 12.9 g/dL — ABNORMAL LOW (ref 13.0–17.0)

## 2010-09-23 LAB — HEMOGLOBIN A1C: Hgb A1c MFr Bld: 6.5 % — ABNORMAL HIGH (ref 4.6–6.1)

## 2010-09-24 LAB — BASIC METABOLIC PANEL
BUN: 14 mg/dL (ref 6–23)
Calcium: 9.5 mg/dL (ref 8.4–10.5)
Creatinine, Ser: 1.08 mg/dL (ref 0.4–1.5)
GFR calc Af Amer: >60 mL/min (ref >60)
GFR calc non Af Amer: >60 mL/min (ref >60)

## 2010-09-24 LAB — HEMOGLOBIN AND HEMATOCRIT, BLOOD: Hemoglobin: 14.5 g/dL (ref 13.0–17.0)

## 2010-09-25 LAB — BASIC METABOLIC PANEL
BUN: 13 mg/dL (ref 6–23)
BUN: 26 mg/dL — ABNORMAL HIGH (ref 6–23)
BUN: 29 mg/dL — ABNORMAL HIGH (ref 6–23)
CO2: 21 mEq/L (ref 19–32)
CO2: 22 mEq/L (ref 19–32)
Calcium: 7 mg/dL — ABNORMAL LOW (ref 8.4–10.5)
Calcium: 7.6 mg/dL — ABNORMAL LOW (ref 8.4–10.5)
Calcium: 8.4 mg/dL (ref 8.4–10.5)
Chloride: 110 mEq/L (ref 96–112)
Chloride: 113 mEq/L — ABNORMAL HIGH (ref 96–112)
Creatinine, Ser: 1.22 mg/dL (ref 0.4–1.5)
Creatinine, Ser: 1.63 mg/dL — ABNORMAL HIGH (ref 0.4–1.5)
Creatinine, Ser: 2.03 mg/dL — ABNORMAL HIGH (ref 0.4–1.5)
GFR calc Af Amer: 39 mL/min — ABNORMAL LOW (ref >60)
GFR calc Af Amer: 54 mL/min — ABNORMAL LOW (ref >60)
GFR calc Af Amer: >60 mL/min (ref >60)
GFR calc non Af Amer: 32 mL/min — ABNORMAL LOW (ref >60)
GFR calc non Af Amer: 33 mL/min — ABNORMAL LOW (ref >60)
GFR calc non Af Amer: 41 mL/min — ABNORMAL LOW (ref >60)
GFR calc non Af Amer: 57 mL/min — ABNORMAL LOW (ref >60)
Glucose, Bld: 164 mg/dL — ABNORMAL HIGH (ref 70–99)
Glucose, Bld: 166 mg/dL — ABNORMAL HIGH (ref 70–99)
Glucose, Bld: 80 mg/dL (ref 70–99)
Potassium: 3.7 mEq/L (ref 3.5–5.1)
Potassium: 4 mEq/L (ref 3.5–5.1)
Sodium: 139 mEq/L (ref 135–145)

## 2010-09-25 LAB — GLUCOSE, CAPILLARY
Comment 1: 555555555
Glucose-Capillary: 101 mg/dL — ABNORMAL HIGH (ref 70–99)
Glucose-Capillary: 111 mg/dL — ABNORMAL HIGH (ref 70–99)
Glucose-Capillary: 112 mg/dL — ABNORMAL HIGH (ref 70–99)
Glucose-Capillary: 113 mg/dL — ABNORMAL HIGH (ref 70–99)
Glucose-Capillary: 115 mg/dL — ABNORMAL HIGH (ref 70–99)
Glucose-Capillary: 115 mg/dL — ABNORMAL HIGH (ref 70–99)
Glucose-Capillary: 124 mg/dL — ABNORMAL HIGH (ref 70–99)
Glucose-Capillary: 131 mg/dL — ABNORMAL HIGH (ref 70–99)
Glucose-Capillary: 132 mg/dL — ABNORMAL HIGH (ref 70–99)
Glucose-Capillary: 138 mg/dL — ABNORMAL HIGH (ref 70–99)
Glucose-Capillary: 140 mg/dL — ABNORMAL HIGH (ref 70–99)
Glucose-Capillary: 153 mg/dL — ABNORMAL HIGH (ref 70–99)
Glucose-Capillary: 153 mg/dL — ABNORMAL HIGH (ref 70–99)
Glucose-Capillary: 155 mg/dL — ABNORMAL HIGH (ref 70–99)
Glucose-Capillary: 158 mg/dL — ABNORMAL HIGH (ref 70–99)
Glucose-Capillary: 163 mg/dL — ABNORMAL HIGH (ref 70–99)
Glucose-Capillary: 168 mg/dL — ABNORMAL HIGH (ref 70–99)
Glucose-Capillary: 172 mg/dL — ABNORMAL HIGH (ref 70–99)
Glucose-Capillary: 195 mg/dL — ABNORMAL HIGH (ref 70–99)
Glucose-Capillary: 205 mg/dL — ABNORMAL HIGH (ref 70–99)
Glucose-Capillary: 241 mg/dL — ABNORMAL HIGH (ref 70–99)
Glucose-Capillary: 75 mg/dL (ref 70–99)
Glucose-Capillary: 86 mg/dL (ref 70–99)
Glucose-Capillary: 90 mg/dL (ref 70–99)
Glucose-Capillary: 90 mg/dL (ref 70–99)
Glucose-Capillary: 98 mg/dL (ref 70–99)

## 2010-09-25 LAB — CULTURE, BLOOD (ROUTINE X 2)
Culture: NO GROWTH
Culture: NO GROWTH

## 2010-09-25 LAB — DIFFERENTIAL
Basophils Absolute: 0 10*3/uL (ref 0.0–0.1)
Basophils Absolute: 0 10*3/uL (ref 0.0–0.1)
Basophils Relative: 0 % (ref 0–1)
Basophils Relative: 0 % (ref 0–1)
Eosinophils Absolute: 0.1 10*3/uL (ref 0.0–0.7)
Monocytes Absolute: 0 10*3/uL — ABNORMAL LOW (ref 0.1–1.0)
Monocytes Absolute: 0.4 10*3/uL (ref 0.1–1.0)
Neutro Abs: 3.8 10*3/uL (ref 1.7–7.7)
Neutrophils Relative %: 84 % — ABNORMAL HIGH (ref 43–77)
Neutrophils Relative %: 97 % — ABNORMAL HIGH (ref 43–77)

## 2010-09-25 LAB — CBC
HCT: 33.9 % — ABNORMAL LOW (ref 39.0–52.0)
HCT: 36.2 % — ABNORMAL LOW (ref 39.0–52.0)
Hemoglobin: 12.4 g/dL — ABNORMAL LOW (ref 13.0–17.0)
Hemoglobin: 13.9 g/dL (ref 13.0–17.0)
MCHC: 34.9 g/dL (ref 30.0–36.0)
MCHC: 35 g/dL (ref 30.0–36.0)
MCV: 92.6 fL (ref 78.0–100.0)
MCV: 92.8 fL (ref 78.0–100.0)
Platelets: 106 10*3/uL — ABNORMAL LOW (ref 150–400)
Platelets: 71 10*3/uL — ABNORMAL LOW (ref 150–400)
RBC: 3.72 MIL/uL — ABNORMAL LOW (ref 4.22–5.81)
RBC: 3.89 MIL/uL — ABNORMAL LOW (ref 4.22–5.81)
RBC: 4.3 MIL/uL (ref 4.22–5.81)
RDW: 13.9 % (ref 11.5–15.5)
RDW: 14.1 % (ref 11.5–15.5)
RDW: 14.1 % (ref 11.5–15.5)
RDW: 14.4 % (ref 11.5–15.5)
WBC: 14.4 10*3/uL — ABNORMAL HIGH (ref 4.0–10.5)
WBC: 14.6 10*3/uL — ABNORMAL HIGH (ref 4.0–10.5)
WBC: 19.2 10*3/uL — ABNORMAL HIGH (ref 4.0–10.5)

## 2010-09-25 LAB — EXPECTORATED SPUTUM ASSESSMENT W GRAM STAIN, RFLX TO RESP C

## 2010-09-25 LAB — POCT CARDIAC MARKERS

## 2010-09-25 LAB — STREP PNEUMONIAE ANTIBODY SEROTYPES
Strep pneumo Type 19: 0.31 ug/mL
Strep pneumo Type 9: 11.05 ug/mL
Strep pneumoniae Type 5 Abs: 3.1 ug/mL
Strep pneumoniae Type 6B Abs: 0.94 ug/mL

## 2010-09-25 LAB — CARDIAC PANEL(CRET KIN+CKTOT+MB+TROPI)
CK, MB: 4.5 ng/mL — ABNORMAL HIGH (ref 0.3–4.0)
Relative Index: 1.1 (ref 0.0–2.5)
Relative Index: 2.3 (ref 0.0–2.5)
Troponin I: 0.03 ng/mL (ref 0.00–0.06)

## 2010-09-25 LAB — MAGNESIUM
Magnesium: 1.3 mg/dL — ABNORMAL LOW (ref 1.5–2.5)
Magnesium: 1.5 mg/dL (ref 1.5–2.5)
Magnesium: 1.9 mg/dL (ref 1.5–2.5)

## 2010-09-25 LAB — URINE CULTURE: Colony Count: >=100000

## 2010-09-25 LAB — POCT I-STAT 3, ART BLOOD GAS (G3+)
Acid-base deficit: 2 mmol/L (ref 0.0–2.0)
Bicarbonate: 20.8 mEq/L (ref 20.0–24.0)
O2 Saturation: 99 %
Patient temperature: 98.6

## 2010-09-25 LAB — COMPREHENSIVE METABOLIC PANEL
ALT: 16 U/L (ref 0–53)
Alkaline Phosphatase: 88 U/L (ref 39–117)
CO2: 25 mEq/L (ref 19–32)
GFR calc non Af Amer: 43 mL/min — ABNORMAL LOW (ref >60)
Glucose, Bld: 197 mg/dL — ABNORMAL HIGH (ref 70–99)
Potassium: 4 mEq/L (ref 3.5–5.1)
Sodium: 136 mEq/L (ref 135–145)

## 2010-09-25 LAB — URINALYSIS, ROUTINE W REFLEX MICROSCOPIC
Ketones, ur: 15 mg/dL — AB
Nitrite: POSITIVE — AB
Protein, ur: 100 mg/dL — AB

## 2010-09-25 LAB — LEGIONELLA ANTIGEN, URINE: Legionella Antigen, Urine: NEGATIVE

## 2010-09-25 LAB — POCT I-STAT, CHEM 8
BUN: 19 mg/dL (ref 6–23)
Calcium, Ion: 1.13 mmol/L (ref 1.12–1.32)
TCO2: 24 mmol/L (ref 0–100)

## 2010-09-25 LAB — CARBOXYHEMOGLOBIN
Carboxyhemoglobin: 1.3 % (ref 0.5–1.5)
Methemoglobin: 0.9 % (ref 0.0–1.5)
O2 Saturation: 78.2 %
Total hemoglobin: 14.6 g/dL (ref 13.5–18.0)

## 2010-09-25 LAB — PHOSPHORUS
Phosphorus: 1.9 mg/dL — ABNORMAL LOW (ref 2.3–4.6)
Phosphorus: 2 mg/dL — ABNORMAL LOW (ref 2.3–4.6)
Phosphorus: 2.7 mg/dL (ref 2.3–4.6)

## 2010-09-25 LAB — URINE MICROSCOPIC-ADD ON

## 2010-09-25 LAB — HEPARIN INDUCED THROMBOCYTOPENIA PNL
Patient O.D.: 0.109
Serotonin Release: 11 % release (ref <20)

## 2010-09-25 LAB — APTT: aPTT: 36 seconds (ref 24–37)

## 2010-10-10 ENCOUNTER — Ambulatory Visit (INDEPENDENT_AMBULATORY_CARE_PROVIDER_SITE_OTHER): Payer: Medicare Other | Admitting: Internal Medicine

## 2010-10-10 ENCOUNTER — Encounter: Payer: Self-pay | Admitting: Internal Medicine

## 2010-10-10 DIAGNOSIS — G309 Alzheimer's disease, unspecified: Secondary | ICD-10-CM

## 2010-10-10 DIAGNOSIS — I10 Essential (primary) hypertension: Secondary | ICD-10-CM

## 2010-10-10 DIAGNOSIS — I4891 Unspecified atrial fibrillation: Secondary | ICD-10-CM

## 2010-10-10 DIAGNOSIS — F0280 Dementia in other diseases classified elsewhere without behavioral disturbance: Secondary | ICD-10-CM

## 2010-10-10 DIAGNOSIS — E119 Type 2 diabetes mellitus without complications: Secondary | ICD-10-CM

## 2010-10-10 MED ORDER — LISINOPRIL 40 MG PO TABS: 40.0000 mg | ORAL_TABLET | Freq: Every day | ORAL | Status: DC

## 2010-10-10 MED ORDER — GLIMEPIRIDE 2 MG PO TABS: 2.0000 mg | ORAL_TABLET | Freq: Every day | ORAL | Status: DC

## 2010-10-10 NOTE — Assessment & Plan Note (Signed)
No recurrence. 

## 2010-10-10 NOTE — Progress Notes (Signed)
Addended byAlfred Levins on: 10/10/2010 01:13 PM   Modules accepted: Orders

## 2010-10-10 NOTE — Progress Notes (Signed)
  Subjective:    Patient ID: Robert Salinas, male    DOB: Jul 18, 1926, 77 y.o.   MRN: [id number]  HPI  F/u htn---tolerating meds without difficulty. Home BPs 140/90  DM---tolerating meds CBGs: not monitored at home  Hypothyroid - on levothyroxine  Past Medical History  Diagnosis Date  . Chickenpox   . Hx of colonic polyps   . Diabetes mellitus   . Hyperlipidemia   . Hypertension   . Atrial fibrillation   . Hypothyroidism   . Thrombocytopenia   . Dementia   . BPH (benign prostatic hyperplasia)    Past Surgical History  Procedure Date  . Appendectomy   . Prostatectomy     reports that he has never smoked. He does not have any smokeless tobacco history on file. He reports that he does not drink alcohol or use illicit drugs. family history is not on file. No Known Allergies  Review of Systems  patient denies chest pain, shortness of breath, orthopnea. Denies lower extremity edema, abdominal pain, change in appetite, change in bowel movements. Patient denies rashes, musculoskeletal complaints. No other specific complaints in a complete review of systems except for ongoing progresive fatigue     Objective:   Physical Exam  well-developed well-nourished male in no acute distress. HEENT exam atraumatic, normocephalic, neck supple without jugular venous distention. Chest clear to auscultation cardiac exam S1-S2 are regular. Abdominal exam overweight with bowel sounds, soft and nontender. Extremities no edema. Neurologic exam is alert but poor memory and broad based gait        Assessment & Plan:

## 2010-10-10 NOTE — Assessment & Plan Note (Signed)
Not as well controlled  Increase lisinopril

## 2010-10-10 NOTE — Assessment & Plan Note (Signed)
Previously controlled 

## 2010-10-10 NOTE — Assessment & Plan Note (Signed)
Continue aricept See me back in 3 months

## 2010-11-01 NOTE — H&P (Signed)
Robert Salinas, Robert Salinas              ACCOUNT NO.:  [id number]   MEDICAL RECORD NO.:  [id number]          PATIENT TYPE:  INP   LOCATION:  0007                         FACILITY:  [facility]   PHYSICIAN:  Sigmund I. Patsi Sears, M.D.DATE OF BIRTH:  [birth year]   DATE OF ADMISSION:  02/18/2009  DATE OF DISCHARGE:                              HISTORY & PHYSICAL   HISTORY OF PRESENT ILLNESS:  Robert Salinas is a 77 year old hypertensive  diabetic, with history of recurrent urinary retention.  The patient was  evaluated after failing multiple medical therapies, with urodynamics to  evaluate for possible diabetes, neuropathy, diabetic neuropathy, versus  outlet obstruction.  Urodynamics shows that the patient has a maximum  capacity of 388 mL, with 5 mL flow rate, 85 cm bladder pressure with  elevated bladder bag base, trabeculation, diverticular formation.  He is  felt to have a high-pressure, low-flow system and is now for [facility] TURP.   PAST MEDICAL HISTORY:  Arthritis.   MEDICATIONS:  1. Aleve  2. Aricept  3. Avodart 0.5 a day.  4. Flomax 0.4 mg per day.  5. Lipitor 10 mg daily.  6. Lisinopril 10 mg  7. Metformin.  8. Nitrofurantoin.  9. Synthroid.   ALLERGIES:  None known.   FAMILY HISTORY:  Significant for Alzheimer's disease (mother).  The  patient has 3 sons and 1 daughter.  Mother, father both deceased.   SOCIAL:  Caffeine use is positive.  The patient is retired, and has a 10  pack-year history of smoking.  No tobacco times 30 years.  Alcohol use  is none currently.   ADMISSION PHYSICAL EXAM:  GENERAL:  Shows well-developed elderly male in  no acute distress.  VITAL SIGNS:  Blood pressure 137/77, temperature 97.4, heart rate is 94.  NECK:  Supple, nontender.  CHEST:  Clear to percussion and auscultation.  ABDOMEN: Soft, plus bowel sounds without organomegaly or masses.  GENITOURINARY:  Shows normal penis, normal urethra, normal glans.  Testicles descended bilaterally.  RECTAL:   Shows normal sphincter tone with 4+ lobular benign prostate.  EXTREMITIES:  No cyanosis and no edema.   ASSESSMENT:  Bladder outlet obstruction in patient with post residual of  89 mL.   PLAN:  Will admit via OR for [facility] TURP.      Sigmund I. Patsi Sears, M.D.  Electronically Signed     SIT/MEDQ  D:  02/18/2009  T:  02/18/2009  Job:  [id number]   cc:   Dr. Penni Bombard

## 2010-11-01 NOTE — Discharge Summary (Signed)
Robert Salinas, Robert Salinas              ACCOUNT NO.:  [id number]   MEDICAL RECORD NO.:  [id number]          PATIENT TYPE:  INP   LOCATION:  5505                         FACILITY:  [facility]   PHYSICIAN:  Michelene Gardener, MD    DATE OF BIRTH:  [birth year]   DATE OF ADMISSION:  12/21/2008  DATE OF DISCHARGE:  12/28/2008                               DISCHARGE SUMMARY   DISCHARGE DIAGNOSES:  1. Escherichia coli bacteremia with initial positive blood culture on      December 21, 2008, and negative repeat blood culture x2 on December 25, 2008.      Currently, the patient on ciprofloxacin.  2. Escherichia coli urinary tract infection.  3. Urinary retention related to benign prostatic hypertrophy and      traumatic Foley catheter removal and the patient will go home with      Foley catheter and reevaluate it by Dr. Patsi Sears Wednesday.  4. Brief episode of atrial fibrillation that evaluated by Cardiology      and the patient does not need Coumadin at this time.  5. Hypothyroidism.  6. Tremors with pseudoseizure.  The patient did not require any      medications.  7. Thrombocytopenia with no evidence of bleeding.  8. Hypotension that is resolved.  9. Atelectasis that is resolved.  10.Dementia remains stable.  11.Benign prostatic hypertrophy.  12.Diabetes mellitus.  13.Hypertension.  14.Hyperlipidemia.   DISCHARGE MEDICATIONS:  1. Lipitor 10 mg once a day.  2. Metformin 1000 mg twice daily.  3. Synthroid 75 mcg once a day.  4. Aricept 5 mg once a day.  5. Flomax 0.4 mg once a day.  6. Lasix 40 mg once a day for 3 days.  7. Ciprofloxacin 500 mg p.o. twice daily for 2 weeks.   CONSULTATIONS:  1. This patient was admitted under Critical Care Medicine with Dr.      Shan Levans and was transferred to [facility] on December 25, 2008.  2. Neurology consult.  3. Cardiology consult.  4. Urology consult with Dr. Patsi Sears.   PROCEDURES:  None.   DIAGNOSTIC STUDIES:  1. CT scan of the head  without contrast on December 21, 2008, showed small      vessel ischemic change without acute problem.  2. Chest x-ray on December 21, 2008, showed bibasilar atelectasis.  3. Repeat x-ray on December 22, 2008, showed mild cardiac enlargement with      atelectasis.  4. Repeat x-ray on December 22, 2008, showed right IJ central venous      catheter with no pneumothorax.  5. Chest x-ray on December 25, 2008, showed cardiomegaly with vascular      congestion.   FOLLOWUP:  1. Dr. Patsi Sears on Wednesday December 30, 2008, at 8:30 a.m.  2. Primary physician within a week.   COURSE OF HOSPITALIZATION:  1. E. coli bacteremia.  This patient was admitted to the hospital with      findings consistent with urosepsis and septic shock.  Her blood      pressure medications were held because of hypotension.  The patient  was started on broad-spectrum antibiotics that include ceftriaxone      and Levaquin.  The patient improved quick during this      hospitalization.  When I started following this patient, she was      already on ceftriaxone and Levaquin and I discontinued ceftriaxone      and continued Levaquin.  To mention, her blood culture was drawn on      December 21, 2008, grew E. coli x2.  I repeated another two sets of her      blood culture on December 25, 2008, and that came to be negative.  Her      E. coli sensitive to ciprofloxacin and I will continue that for 2      more weeks.  Currently at the time of discharge, the patient is      afebrile.  White count has been within normal limits.  2. E. coli urinary tract infection.  Management as #1 and the patient      will be continued on 2 weeks of ciprofloxacin.  3. Urinary retention.  The patient had removal of Foley catheter on      December 25, 2008, and it seems to be traumatic removal where the      patient developed penile swelling and was not able to urinate.      Urology consultation was consulted to do cystoscopy and insert      Foley catheter.  That was done on December 27, 2008, and the cystoscopy      showed trauma within the urethra.  Recommendation by urologist to      leave the Foley catheter and to follow in their office next      Wednesday and at that time, Foley catheter will be removed, and the      patient will be given trial for voiding and further management will      be determined by Urology.  4. Brief episode of atrial fibrillation.  This has been evaluated by      Cardiology during this hospitalization.  Echocardiogram was      performed on December 24, 2008, and it showed normal ejection fraction      of 55-60% with normal wall motion.  There are no recommendations      for Coumadin at this time given the short duration of the episode.      The patient was started on metoprolol for rate control and actually      the patient did very well.  Lisinopril was discontinued.  5. Hypotension that most likely secondary to infection and that has      been resuscitated with IV fluids.  As mentioned above, her      lisinopril was discontinued when he came in, but then was started      on metoprolol for better rate control in addition to hypertension      and the patient tolerated well.  6. Diabetes mellitus.  Metformin was continued.  7. Hypothyroidism.  TSH was elevated at this time.  Synthroid was      increased to 75 mcg.  The patient is recommended to follow with      primary physician, to repeat TSH within 3 months and to adjust      Synthroid accordingly.  8. Atelectasis that has been stable.   The patient will be discharged home today.  Foley catheter will be in  place.  Home health nurse will follow for Foley catheter care  and the  patient will have also PT and OT at home.   Total assessment time is 40 minutes.      Michelene Gardener, MD  Electronically Signed     NAE/MEDQ  D:  12/28/2008  T:  12/29/2008  Job:  [id number]

## 2010-11-01 NOTE — H&P (Signed)
Robert Salinas, LAMPERT NO.:  [id number]   MEDICAL RECORD NO.:  [id number]          PATIENT TYPE:  INP   LOCATION:  2111                         FACILITY:  [facility]   PHYSICIAN:  Charlcie Cradle. Delford Field, MD, FCCPDATE OF BIRTH:  Sep 10, 1926   DATE OF ADMISSION:  12/21/2008  DATE OF DISCHARGE:                              HISTORY & PHYSICAL   CHIEF COMPLAINT:  Fever and new onset seizures.   HISTORY OF PRESENT ILLNESS:  A 77 year old male who has noted onset of  shakiness, increased emesis, incontinence all day, unable to control  bladder, began shaking all over, and then having unremitting seizure-  type activity, brought in by EMS, not intubated, loaded with Dilantin,  noted to have a temperature of 104 degrees, admitted to critical care  service.  The patient has history of diabetes, hypothyroidism, and  hypercholesterolemia.  Previous known history of urosepsis.  Recent  prostate biopsy, results are pending.   PAST MEDICAL HISTORY:  Prostatic hypertrophy, history of diabetes,  hypothyroidism, hypercholesterolemia, and hypertension.   MEDICATION ALLERGIES:  None.   MEDICATIONS PRIOR TO ADMISSION:  The patient's spouse is unclear of  the  medications the patient is on.  She says he is on a cholesterol  medicine, antihypertensive, Flomax, thyroid supplement, and diabetic  pills, but she is unclear of any other medications.   FAMILY HISTORY:  Noncontributory.   SOCIAL HISTORY:  Lives at home with wife.  Occasional alcohol, does not  smoke.   PAST SURGICAL HISTORY:  Surgical biopsy only, otherwise negative.   REVIEW OF SYSTEMS:  CARDIOVASCULAR:  No history of chest pain or  palpitations or cardiac known history.  RESPIRATORY:  Does have dyspnea.  GI:  Increased emesis.  UROLOGIC:  Increased incontinence, unable to  control bladder.  ENDOCRINE:  Increased polyuria, history of diabetes is  noted.  NEUROLOGIC:  Shaking all over, uncontrolled bladder, seizure  activity.   No headaches.  Other review of systems are unremarkable.   PHYSICAL EXAMINATION:  GENERAL:  This is an ill-appearing male, on  nonrebreather, breathing 35 times a minute.  VITAL SIGNS:  Temperature max 104 rectally, 102.7 by Foley; blood  pressure 160/86, pulse 118, respirations 24, saturation 95% on 100%  nonrebreather.  CHEST:  Distant breath sounds with prolonged expiratory phase.  No  wheeze or rhonchi.  CARDIAC:  Resting tachycardia without S3, normal S1 and S2.  ABDOMEN:  Soft, nontender.  There was no organomegaly.  EXTREMITIES:  No edema or clubbing, adequate perfusion.  NEUROLOGIC:  The patient will move all 4s, follow simple commands.  He  is not actively seizing at the time of dictation.  HEENT:  Extraocular movements are intact.  Pupils are equal, round, and  reactive to light and accommodation.  Oropharynx clear.  NECK:  Supple.  No jugular venous distention.   LABORATORY DATA:  White count 4,139, platelet count of 106.  Sodium 139,  potassium 4.2, chloride 105, CO2 of 24, and creatinine 1.9, BUN 19.  Blood sugar 201, calcium ionized 1.13.  Hemoglobin 13.6.  CT head,  stable small vessel disease, diffuse atrophy.  No  acute process.  Chest  x-ray, low lung volumes.  No overt pneumonia or edema and stable  cardiomegaly.  Other lab data pending at the time of this dictation.  Cardiac enzymes are unremarkable.  EKG shows sinus tachycardia, atrial  premature complex, and borderline T-wave changes.   IMPRESSION:  1. Status epilepticus, unclear cause.  2. Fever with serious etiology, but no evidence of severe sepsis or      septic shock, unclear source, rule out urinary source or pneumonic      source.  3. Diabetes, poor control.  4. Hypothyroidism.  5. Hypercholesterolemia.  6. Recent prostate biopsy with prostatic symptoms.   RECOMMENDATIONS:  Place into the ICU, give Dilantin load, obtain  Neurology consultation, give IV fluids, hypercholesteremia protocol,  administer  Rocephin, Zithromax, and Cipro.  Follow up cultures of blood  and urine.  Check lactic acid, see orders.      Charlcie Cradle Delford Field, MD, [facility]  Electronically Signed     PEW/MEDQ  D:  12/21/2008  T:  12/22/2008  Job:  [id number]   cc:   Quita Skye. Artis Flock, M.D.

## 2010-11-01 NOTE — Op Note (Signed)
Robert Salinas, Robert Salinas              ACCOUNT NO.:  [id number]   MEDICAL RECORD NO.:  [id number]          PATIENT TYPE:  INP   LOCATION:  0007                         FACILITY:  [facility]   PHYSICIAN:  Sigmund I. Patsi Sears, M.D.DATE OF BIRTH:  1927-02-06   DATE OF PROCEDURE:  DATE OF DISCHARGE:                               OPERATIVE REPORT   PREOPERATIVE DIAGNOSIS:  Chronic bladder outlet obstruction.   POSTOPERATIVE DIAGNOSIS:  Chronic bladder outlet obstruction.   OPERATION:  Cystourethroscopy, TURP.   ANESTHESIA:  General LMA.   PREPARATION:  After appropriate preanesthesia, the patient was brought  to the operating room and placed on the operating table in the dorsal  supine position where general LMA anesthesia was introduced.  He was  then replaced in dorsal lithotomy position where the pubis was prepped  with Betadine solution and draped in usual fashion.   The patient's history is as follows.  Mr. Micheletti is a 77 year old  hypertensive diabetic with a history of recurrent urinary retention.  Urodynamics shows severe urinary obstruction, cystoscopy shows  trabeculation and cellule formation.  The patient has a maximum capacity  of 388 mL, with loss of compliance, but a flow rate of only 5 mL per  second and a maximum detrusor pressure of 85 cm of water and elevated  bladder base.  Diverticula are identified.  He is now for TURP with the  Gyrus instrumentation.   PROCEDURE:  Cystourethroscopy was accomplished, and shows massive  trilobar BPH, with trabeculation and cellule formation and diverticula  formation.  There is no bladder stone or tumor noted.  The orifices were  poorly identified, but there is clear efflux from both orifices.  Using  the Gyrus resectoscope, attention was directed from the 7 o'clock to the  5 o'clock position, and resection is accomplished to the level of the  veru.  Resection was accomplished from the 11 o'clock to the 7 o'clock  position, and then  from the 4 o'clock to the 6 o'clock position.  It was  felt that there was minimal tissue from the 1 to 3 o'clock positions.  There was a large amount of bleeding from the prostate, and a large  amount of time was devoted to electrocoagulation of the resected area,  and chip evacuation.  It was elected to place a size 24 cm Simplastic  three-way Foley catheter and this was placed, to traction and continuous  irrigation with clearing of urine.  The patient was awakened and taken  to the recovery room in good condition.      Sigmund I. Patsi Sears, M.D.  Electronically Signed    SIT/MEDQ  D:  02/18/2009  T:  02/18/2009  Job:  [id number]   cc:   Quita Skye. Artis Flock, M.D.  Fax: [phone]

## 2010-11-01 NOTE — Procedures (Signed)
EEG NUMBER:  [id number].   REQUESTING PHYSICIAN:  Kalman Shan, MD.   CLINICAL HISTORY:  A 77 year old status epilepticus. EEG is performed  for evaluation.  The patient describes as awake and drowsy.   This portable EEG done at the bedside without photic stimulation or  hyperventilation.   DESCRIPTION:  The dominant rhythm tracing is seen briefly in awake and  is moderate amplitude alpha rhythm with 9-10 Hz, which predominates  posteriorly, appears without abnormal asymmetry, attenuates with eye  opening and closing.  Much of the record is made in drowsiness and in  stage I sleep, in which the background fragments and the record consists  of low amplitude mixed theta rhythms of 5-8 Hz.  No abnormalities seen  in drowsiness or sleep.  Single channel devoted EKG revealed sinus  rhythm throughout with a rate of approximately 72 beats per minute.   CONCLUSION:  Normal study in awake, drowsy and light sleep states.  No  focal slowing is noted and no epileptiform discharges are seen.      Michael L. Thad Ranger, M.D.  Electronically Signed     ZOX:WRUE  D:  12/22/2008 23:00:55  T:  12/23/2008 05:01:20  Job #:  [id number]

## 2010-11-01 NOTE — Consult Note (Signed)
NAMEGLADYS, GUTMAN NO.:  [id number]   MEDICAL RECORD NO.:  [id number]          PATIENT TYPE:  INP   LOCATION:  2111                         FACILITY:  [facility]   PHYSICIAN:  Madolyn Frieze. Jens Som, MD, FACCDATE OF BIRTH:  [birth year]   DATE OF CONSULTATION:  12/23/2008  DATE OF DISCHARGE:                                 CONSULTATION   The patient is a 77 year old male with past medical history of dementia,  diabetes, hypertension, hyperlipidemia, benign prostatic hypertrophy,  admitted with urosepsis, who I am asked to evaluate for atrial flutter.  He has no prior cardiac history.  The patient was admitted on December 21, 2008, with complaints of nausea and vomiting, seizure activity, fevers,  and decreased level of consciousness.  He was found to have E. coli  sepsis and was treated.  He has been improving following antibiotics.  Today, he developed atrial flutter and Cardiology was asked to further  evaluate.  Note, the patient denies any chest pain, shortness of breath,  or palpitations.   His medications at present include Protonix 40 mg IV daily,  levothyroxine 50 mcg IV daily, insulin, ciprofloxacin IV, Rocephin IV.  He is now off pressors.   He has no known drug allergies.   SOCIAL HISTORY:  He is married.  He does not consume alcohol, and he  does not smoke per his wife.   FAMILY HISTORY:  Noncontributory.   PAST MEDICAL HISTORY:  Significant diabetes, hypertension,  hyperlipidemia.  He has benign prostatic hypertrophy.  He also has  hypothyroidism.   REVIEW OF SYSTEMS:  He denies any headaches or fevers or chills at  present.  He did have fevers on arrival.  There is no productive cough  or hemoptysis.  There is no dysphagia, odynophagia, melena, or  hematochezia.  There is no hematuria.  There is no rashes or seizure  activity at present.  He apparently did have shaking on arrival, but  Neurology feels this may have be related to his sepsis.  Remaining  systems are negative other than he does have some degree of dementia per  his wife.   PHYSICAL EXAMINATION:  VITAL SIGNS:  Today shows a blood pressure of  105/65 and his pulse is 126.  His temperature is 99.7.  He is 100% on  room air.  GENERAL:  He is well developed and somewhat obese.  He is in no acute  distress at present.  He does not appear to be depressed.  SKIN:  Warm and dry.  EXTREMITIES:  There is no peripheral clubbing.  BACK:  Normal.  HEENT:  Normal with normal eyelids.  NECK:  Supple with normal upstroke bilaterally.  No bruits.  There is no  jugular venous distention.  I cannot appreciate thyromegaly.  CHEST:  Clear to auscultation.  Normal expansion.  CARDIOVASCULAR:  Tachycardic rate and an irregular rhythm.  I cannot  appreciate murmurs, rubs, or gallops.  ABDOMEN:  Nontender, nondistended.  Positive bowel sounds.  No  hepatosplenomegaly.  No masses appreciated.  There is no abdominal  bruit.  EXTREMITIES:  He has 2+ femoral  pulses bilaterally.  No bruits.  The  extremities show trace edema.  There are no cords palpated.  He has 2+  dorsalis pedis pulse bilaterally.  NEUROLOGIC:  Grossly intact other than a peripheral neuropathy.   An electrocardiogram shows atrial flutter at a rate of 129.  There are  nonspecific ST changes.  His magnesium today is 1.7.  His sodium is 139  with potassium of 4.  BUN and creatinine are 26 and 1.51.  His cultures  showed E. coli.  His last CBC from today shows a white blood cell count  of 14.6 with a hemoglobin of 11.6, hematocrit of 33.9.  His platelet  count was 71.  A chest x-ray shows no pneumothorax following an IJ  insertion.  There is moderate cardiomegaly noted.   DIAGNOSES:  1. Atrial flutter - this is most likely due to the stress of his      urosepsis.  We will check an echocardiogram to quantify left      ventricular function, as well as a TSH.  His blood pressure is      borderline following his recent urosepsis.   We will add intravenous      amiodarone both for rate control and convert to normal sinus      rhythm.  I will not add anticoagulation at this point given the low      platelet count.  Also, the duration is short and I think if this is      an isolated episode, he will need long-term anticoagulation.  2. Hypertension - he is off his blood pressure medicines due to his      recent urosepsis.  3. Hyperlipidemia.  4. Diabetes mellitus.  5. History of dementia.  6. Benign prostatic hypertrophy.  7. Urosepsis - we will continue with his antibiotics.      Madolyn Frieze Jens Som, MD, [place]  Electronically Signed     BSC/MEDQ  D:  12/23/2008  T:  12/24/2008  Job:  [id number]

## 2010-11-01 NOTE — Consult Note (Signed)
NAME:  Robert Salinas, Robert Salinas              ACCOUNT NO.:  [id number]   MEDICAL RECORD NO.:  [id number]          PATIENT TYPE:  INP   LOCATION:  5505                         FACILITY:  [facility]   PHYSICIAN:  Sigmund I. Patsi Sears, M.D.DATE OF BIRTH:  26-Sep-1926   DATE OF CONSULTATION:  12/26/2008  DATE OF DISCHARGE:                                 CONSULTATION   SUBJECTIVE:  This is a 77 year old male who admitted on December 21, 2008,  with nausea, vomiting, urinary incontinence, and shaking.  The patient  had a temperature of 104 degrees at that time, was admitted to Critical  Care Service.  He had a history of diabetes, hypothyroidism, elevated  cholesterol with previous urosepsis, and recent prostate biopsy, which  was negative for cancer.  The patient was treated in the intensive care  unit by the ICU service, was found to have E. coli sepsis and treated  appropriately.  Coincidently, the patient had atrial flutter which was  treated.  Currently, the patient is on lisinopril, Lipitor, Flomax,  Synthroid, and metformin.  He was scheduled for discharge, Foley  catheter was removed, but he was unable to void.  Foley catheter was  replaced, and the patient apparently suffered iatrogenic Foley catheter  trauma during attempted intubation.  Urology is consulted for  catheterization.   PAST HISTORY:  Significant for:  1. Diabetes.  2. Alzheimer's.  3. Hypothyroidism.  4. Hypertension.  5. Hyperlipidemia.  6. History urosepsis.  Current urine culture shows Gram-negative rods      in urine with history of E. coli sepsis.  The patient on      ceftriaxone x3 days.   HOME MEDICATIONS:  None.   ALLERGIES:  None.   FAMILY HISTORY:  Noncontributory.   SOCIAL HISTORY:  The patient lives at home with his wife.  Alcohol  occasional.  Tobacco is none.   PAST SURGICAL HISTORY:  Significant for prostate biopsy.   REVIEW OF SYSTEMS:  Constitutional review of systems is significant for  increase urinary  incontinence.  He also has a history of polyuria,  diabetes, shaking chills, uncontrolled bladder, possible seizure  activity.  No headache.   PHYSICAL EXAMINATION:  GENERAL:  An elderly, slightly confused white  male in no acute distress.  VITAL SIGNS:  Blood pressure is 180/60, respiratory rate 18, temperature  98.3, and heart rate 82.  NECK:  Supple, nontender.  CHEST:  Clear to P and A.  ABDOMEN:  Soft.  Positive bowel sounds without organomegaly and without  masses.  GENITOURINARY:  Marked penile edema.  The patient is uncircumcised.  The  scrotum is normal, testicles are palpable bilaterally, but has a small  scrotum.  EXTREMITIES:  Mild-to-moderate edema.   IMPRESSION:  Urinary retention with traumatic Foley catheterization.  The patient has Escherichia coli urosepsis, currently treated with  Levaquin.   PLAN:  Plan will be to flexible cystoscope and catheterize the patient.  The patient has been given Lasix today with no urine output.   PROCEDURE:  Flexible bedside cystoscopy is accomplished with the patient  in supine position.  The penis was prepped with Betadine  solution and  draped in usual fashion.   Flexible cystoscopy was accomplished and traumatic urethra is  identified.  The scope was passed beyond the trauma, which is noted in  the membranous urethra.  There is bilateral prostate enlargement.  The  bladder base is normal and clear efflux is seen from both orifices.  There is a large amount of residual.  A guidewire was placed in the  bladder and a 16-French Councill catheter was passed into the bladder.  A 10 mL placed in the balloon.  The bladder then was drained of 1100 mL  of clear straw-colored fluid (-100 mL irrigation).   Foley catheter is left to straight drainage.  The patient tolerated the  procedure well.  I have discussed this with the patient's wife.  She  will bring the patient into the office for followup of his urinary  retention.       Sigmund I. Patsi Sears, M.D.  Electronically Signed     SIT/MEDQ  D:  12/26/2008  T:  12/27/2008  Job:  [id number]

## 2010-11-01 NOTE — Consult Note (Signed)
Robert Salinas, Robert Salinas              ACCOUNT NO.:  [id number]   MEDICAL RECORD NO.:  [id number]          PATIENT TYPE:  INP   LOCATION:  2111                         FACILITY:  [facility]   PHYSICIAN:  Marolyn Hammock. Reynolds, M.D.DATE OF BIRTH:  [birth year]   DATE OF CONSULTATION:  12/22/2008  DATE OF DISCHARGE:                                 CONSULTATION   HISTORY OF PRESENT ILLNESS:  This is a pleasant Caucasian 77 year old  male with known history of dementia, at present time on Aricept 5 mg  daily, and is followed by Bradd Canary, who is his primary care doctor.  Apparently, the patient was not feeling well over the past 3 days and  having increased urinary incontinence.  The patient has already suffered  urinary incontinence, in which he takes Flomax 0.4 mg daily; however,  per wife, the patient was significantly more incontinent over the past 2-  3 days.  On December 21, 2008, the patient started to become nauseated and  the patient was nauseated and started vomiting throughout the day.  At  approximately 6:00 p.m., the patient was in his recliner watching TV and  had a incontinent spell.  At this point, the history from the wife  becomes sketchy as she is unable to put the history together but states  that although he urinated on himself, he was trying to take his clothes  off.  She left the room, came back and noticed that he was shaking all 4  extremities, eyes rolled back.  She states that while he was shaking, he  was able to walk to the other room, although he was not conversive with  air.  After they arrived at his bedroom, they called EMS.  She also  states during these shaking episodes, he was able to talk and answer EMS  staff while they were at his house.  The patient was brought to [facility] ED where he was found to have questionable sepsis.  The patient's  wife denies any history of TBI, CVA, or brain surgery.  At present time,  the patient has not had another seizure and is  currently on Dilantin 100  mg q.8 h.   PAST MEDICAL HISTORY:  1. Hypertension.  2. Diabetes.  3. High cholesterol.  4. Hypothyroidism.  5. Urosepsis in 2003.  6. Dementia.   MEDICATIONS AT HOME:  1. Flomax 0.4 mg daily.  2. Aricept 5 mg daily.  3. Metformin 1 g daily.  4. Lipitor 10 mg daily.  5. Lisinopril 0.5 mg daily.   MEDICATIONS WHILE IN THE HOSPITAL:  1. Zithromax 500 mg IV nightly.  2. Rocephin 1 g IV nightly.  3. Cipro 400 mg IV b.i.d. which we will recommend to change to      alternative medication.  4. Dilantin 100 mg IV q.8 h.  5. Protonix 40 mg IV q.22 h.   ALLERGIES:  Negative.   SOCIAL HISTORY:  The patient does not smoke, drink, or do illicit drugs.  He is a very active individual at home, apparently 2 days prior to this  out cutting grass and  mowing the lawn.   REVIEW OF SYSTEMS:  All negative with the exception of above.   PHYSICAL EXAMINATION:  VITAL SIGNS:  Blood pressure is 96/72, pulse 82,  respiratory 19, temperature 98.8.  GENERAL:  The patient is alert.  He is oriented to [facility], however,  believes this is 1990.  He answers all questions without any hesitation.  He is a well enunciated.  PULMONARY:  Clear to auscultation bilaterally.  CARDIOVASCULAR:  S1 and S2.  Regular rate and rhythm.  NECK:  Negative for bruits and supple.  GASTROINTESTINAL:  Abdomen is soft, nontender, nondistended in all 4  quadrants and bowel sounds are audible in all 4 quadrants.  NEUROLOGIC:  The patient as stated is alert.  He carries out 2 and 3-  step commands without difficulty.  Pupils are equal and reactive  accommodating to light.  His right pupil was pinpoint, his left pupil  was 2 mm.  The patient's wife states that he has had difficulty seeing  with his right eye since he had cataract surgery.  He does have  conjugate gaze.  Extraocular muscles are intact.  Visual fields are  intact to both double simultaneous stimulation.  Face is symmetrical.  Tongue  is midline.  Uvula is midline.  Facial sensation V1 through V3 is  full.  Shoulder shrug and head turn are within normal limits.  Coordination/cerebellum:  Finger-to-nose is smooth.  Heel-to-shin  smooth.  Fine motor movements within normal limits.  Motor and gait:  Gait was deferred at this time.  The patient moves all  extremities, 4+/5, full range of motion.  No asterixis, clonus, or  tremor.  No increased tone noted.  Deep tendon reflexes are 1+  throughout with bilateral upgoing toes.  Drift is negative bilaterally in upper and lower extremities.  Sensation:  The patient does have positive distal peripheral neuropathy  at his feet; otherwise, he has good proprioception, vibration, pinprick,  and light touch throughout.   LABORATORY DATA:  The patient's urinalysis shows positive for nitrites  and leukocytes.  The patient's white blood cell count is 14.4, platelets  90, hemoglobin and hematocrit 13.9 and 39.9.  Sodium 142, potassium 3.5,  chloride 106, CO2 24, BUN is 22, creatinine is 2.03, glucose 192.   IMAGING AND TESTS:  Head CT is negative for acute stroke or mass.   ASSESSMENT:  This is a 77 year old male with history of urosepsis and  dementia now presenting with possible seizure in the setting of possible  sepsis and urinary tract infection.  Due to no history of previous  seizure, I would  recommend continuing Dilantin 100 mg q.8 h. while treating sepsis,  change Cipro to alternative antibiotic as it is seizuregenic, obtain an  EEG.  Most likely cause of symptoms is infection at this time, and we  will discuss the need to continue Dilantin with Dr. Thad Ranger after being  discharged.  We will discuss this with Dr. Thad Ranger.     ______________________________  Felicie Morn, PA-C      Marolyn Hammock. Thad Ranger, M.D.  Electronically Signed    DS/MEDQ  D:  12/22/2008  T:  12/22/2008  Job:  [id number]   cc:   PCCM

## 2010-11-04 NOTE — Discharge Summary (Signed)
NAME:  Robert Salinas, Robert Salinas                        ACCOUNT NO.:  [id number]   MEDICAL RECORD NO.:  [id number]                   PATIENT TYPE:  INP   LOCATION:  5508                                 FACILITY:  [facility]   PHYSICIAN:  Duffy Rhody C. Andrey Campanile, M.D.             DATE OF BIRTH:  27-Feb-1927   DATE OF ADMISSION:  02/08/2002  DATE OF DISCHARGE:  02/11/2002                                 DISCHARGE SUMMARY   PRESENTING HISTORY:  This 77 year old white male was admitted with fever and  urinary incontinence starting two days prior to admission.  He had had no  symptoms initially when he was seen in the office for myalgias and was seen  and admitted through the emergency room by Dr. Talmadge Coventry.   ALLERGIES:  None.   MEDICATIONS:  The patient is on Avandia and Lipitor.   REVIEW OF SYSTEMS AND PAST MEDICAL HISTORY:  Review of systems and past  medical history were gone through and will not be repeated.   PHYSICAL EXAMINATION:  Physical exam by Dr. Smith Mince indicated the blood  pressure was 175/97, pulse 115, respirations 22, temperature was 102.1 and  97, apparently at two separate locations, and there were no other specific  findings.   HOSPITAL COURSE:  The patient was admitted.  His sugar jumped up and down  but was generally controlled.  He was placed on fluids and antibiotics.  He  had been having paresthesias of the lower extremities which were felt to be  neuropathy but within 24 hours, his urinary symptoms had substantially  diminished and he felt much better.  Consultation with urologist felt this  was urosepsis with past orchitis and BPH and agreed with continuing  antibiotic and starting the Flomax, with followup post hospitalization.  He  underwent an ultrasound of the kidneys which did not show any focal  abnormality other than a mildly enlarged prostate.  Scan of the back  indicated some moderate stenosis of the canal without other abnormalities.  White count on  admission was 12,000, falling to 9000; the hemoglobin was  normal.  Sugar on admission was 174, falling to 85.  His electrolytes were  good.  Glycosylated hemoglobin was 7.9.  His TSH was borderline at 6.0; this  would be followed up in the office.  PSA was 3.9.  Cultures of the blood  were negative.  Urine culture showed Citrobacter with generally excellent  sensitivities.  Neurology felt that this patient had a peripheral neuropathy  and agreed to see him in followup and by the day of discharge, he had no  further chills, he was eating well and he was discharged to be followed up.   DISCHARGE DIAGNOSES:  1. Urinary tract infection.  2. Benign prostatic hypertrophy.  3. Diabetes, type 2, with probable peripheral neuropathy.   DISCHARGE MEDICATIONS:  1. Resume Avandia 4 mg a day.  2. Tequin 400 mg every day x2 weeks  total.  3. Flomax 0.4 mg every day.   FOLLOWUP:  Follow up in my office in one week and to urologist and  neurologist within the next three to four weeks.   COMPLICATIONS:  None.   CONDITION ON DISCHARGE:  Improved.   DIET:  Reemphasized his diabetic diet.                                               Stanley C. Andrey Campanile, M.D.    SCW/MEDQ  D:  03/22/2002  T:  03/26/2002  Job:  [id number]

## 2010-11-04 NOTE — Consult Note (Signed)
NAME:  Robert Salinas, Robert Salinas                        ACCOUNT NO.:  [id number]   MEDICAL RECORD NO.:  [id number]                   PATIENT TYPE:  INP   LOCATION:  5508                                 FACILITY:  [facility]   PHYSICIAN:  Dr. Sherilyn Cooter                           DATE OF BIRTH:  04-18-1927   DATE OF CONSULTATION:  02/10/2002  DATE OF DISCHARGE:                                   CONSULTATION   SUBJECTIVE:  This is a 77 year old white married male from [place],  admitted on August 23 with urosepsis, diabetes.  The patient had noted  dysuria for 2 days and was receiving pain for 2 days, chills, and new onset  of urinary incontinence.  The patient was directed to [facility] Emergency  Room, where he had a blood sugar of 193, shaking chills, temperature of  102.1, a white blood cell count of 12,000.   REVIEW OF SYSTEMS:  Review of systems is significant for a 2-year history of  increasing voiding difficulty, with small urinary stream, intermittency,  urinary frequency, nocturia x2, but no gross hematuria.   PAST HISTORY:  Past history is also significant for:  1. Degenerative disk disease.  2. Elevated cholesterol, treated with Lipitor p.r.n.  3. History of GERD.   ALLERGIES:  None known.   SOCIAL HISTORY:  Patient is retired.  He is a former smoker.  He has not  smoked since 1983.  Alcohol is occasional.   LABORATORY:  The patient has a PC of 3.9, serum creatinine was normal,  urinalysis abnormal with many white blood cells and occasional microscopic  red blood cell.  Urine culture is negative today, however.  TSH is elevated  at 6.0, hemoglobin A1c elevated at 7.9.  Admission glucose 174, now 85  today.   PHYSICAL EXAMINATION:  GENERAL:  Examination shows a well-developed white  male, in no acute distress.  ABDOMEN:  Benign, positive bowel sounds, without organomegaly, without  masses.  GENITALIA:  Testicles are descended bilaterally.  The right testicle is  enlarged,  tender, and slightly erythematous.  The right testicle measures 7  cm by 5 cm, and the left testicle measures 5 cm by 4 cm and nontender.  The  scrotum is normal, with no scrotal edema.  Medications include Tequin  IV/p.o.  RECTAL:  Examination shows decrease in tone.  Prostate was 3+ and benign.  (Note the patient has normal bowel movements.)   IMPRESSION:  1. Urosepsis.  2. Right epididymal orchitis treated with Tequin.  3. Benign prostatic hypertrophy, currently untreated.  4. Diabetes, out of control, probably secondary to infection.  5. Thyroid disease, under evaluation.   PLAN:  1. Agree with Tequin.  The patient will need 3 weeks post-op Tequin.  It     will take his testicle 3 weeks before it     recovers.  It  is doubtful that he will need surgical intervention.  2. Would advise renal ultrasound.  3. Will begin Flomax 0.4 mg 1 per day.                                               Dr. Sherilyn Cooter    DH/MEDQ  D:  02/10/2002  T:  02/11/2002  Job:  [id number]

## 2010-11-16 ENCOUNTER — Other Ambulatory Visit: Payer: Self-pay | Admitting: Internal Medicine

## 2010-12-16 ENCOUNTER — Telehealth: Payer: Self-pay | Admitting: *Deleted

## 2010-12-16 NOTE — Telephone Encounter (Signed)
We can write a prescription and he can get supplies from wherever he chooses

## 2010-12-16 NOTE — Telephone Encounter (Signed)
Pt aware, will call back if a rx is needed

## 2010-12-16 NOTE — Telephone Encounter (Addendum)
Pt was wondering if Dr Cato Mulligan was ok with pt receiving diabetic supplies through [facility].  They seen a commercial on TV

## 2011-01-09 ENCOUNTER — Ambulatory Visit: Payer: Medicare Other | Admitting: Internal Medicine

## 2011-02-08 ENCOUNTER — Ambulatory Visit: Payer: Medicare Other | Admitting: Internal Medicine

## 2011-02-09 ENCOUNTER — Ambulatory Visit (INDEPENDENT_AMBULATORY_CARE_PROVIDER_SITE_OTHER): Payer: Medicare Other | Admitting: Family Medicine

## 2011-02-09 ENCOUNTER — Encounter: Payer: Self-pay | Admitting: Family Medicine

## 2011-02-09 VITALS — BP 140/90 | HR 83 | Temp 97.6°F | Wt 207.0 lb

## 2011-02-09 DIAGNOSIS — N390 Urinary tract infection, site not specified: Secondary | ICD-10-CM

## 2011-02-09 DIAGNOSIS — N289 Disorder of kidney and ureter, unspecified: Secondary | ICD-10-CM

## 2011-02-09 DIAGNOSIS — E119 Type 2 diabetes mellitus without complications: Secondary | ICD-10-CM

## 2011-02-09 LAB — POCT URINALYSIS DIPSTICK
Ketones, UA: NEGATIVE
pH, UA: 6

## 2011-02-09 LAB — HEPATIC FUNCTION PANEL
ALT: 27 U/L (ref 0–53)
Total Bilirubin: 0.9 mg/dL (ref 0.3–1.2)

## 2011-02-09 LAB — BASIC METABOLIC PANEL
BUN: 29 mg/dL — ABNORMAL HIGH (ref 6–23)
Calcium: 9.1 mg/dL (ref 8.4–10.5)
Creatinine, Ser: 2.3 mg/dL — ABNORMAL HIGH (ref 0.4–1.5)
GFR: 29.52 mL/min — ABNORMAL LOW (ref >60.00)

## 2011-02-09 LAB — CBC WITH DIFFERENTIAL/PLATELET
Eosinophils Relative: 1.4 % (ref 0.0–5.0)
HCT: 40.3 % (ref 39.0–52.0)
Hemoglobin: 13.5 g/dL (ref 13.0–17.0)
Lymphs Abs: 0.8 10*3/uL (ref 0.7–4.0)
Monocytes Relative: 8.8 % (ref 3.0–12.0)
Platelets: 146 10*3/uL — ABNORMAL LOW (ref 150.0–400.0)
WBC: 5.5 10*3/uL (ref 4.5–10.5)

## 2011-02-09 LAB — TSH: TSH: 2.67 u[IU]/mL (ref 0.35–5.50)

## 2011-02-09 MED ORDER — CIPROFLOXACIN HCL 500 MG PO TABS: 500.0000 mg | ORAL_TABLET | Freq: Two times a day (BID) | ORAL | Status: AC

## 2011-02-09 NOTE — Progress Notes (Signed)
  Subjective:    Patient ID: Robert Salinas, male    DOB: 05/04/1927, 77 y.o.   MRN: [id number]  HPI Here for 5 days of intermittent fevers to 100 degrees, weakness, mild lower abdominal pains, and nausea. He has only vomited once but his appetite is poor. Fluid intake is good. He is mildly constipated, which is normal for him. No SOB or chest pain or coughing. He has had UTIs in the past, and his wife thinks he may have one now.    Review of Systems  Constitutional: Positive for fever.  HENT: Negative.   Respiratory: Negative.   Cardiovascular: Negative.   Gastrointestinal: Positive for nausea, vomiting, abdominal pain and constipation. Negative for diarrhea, blood in stool and abdominal distention.  Genitourinary: Negative.        Objective:   Physical Exam  Constitutional:       Alert, weak, walks with his cane   Eyes: Conjunctivae are normal. Pupils are equal, round, and reactive to light.  Neck: No thyromegaly present.  Cardiovascular: Normal rate, regular rhythm, normal heart sounds and intact distal pulses.   Pulmonary/Chest: Effort normal and breath sounds normal. No respiratory distress. He has no wheezes. He has no rales. He exhibits no tenderness.  Abdominal: Soft. Bowel sounds are normal. He exhibits no distension and no mass. There is no rebound and no guarding.       Mildly tender in both lower quadrants   Lymphadenopathy:    He has no cervical adenopathy.          Assessment & Plan:  This is probably another UTI. He will drink plenty of water. Take Cipro for 14 days. Await the urine culture. Sent for more labs including an A1c.

## 2011-02-12 LAB — URINE CULTURE: Colony Count: 75000

## 2011-02-13 NOTE — Progress Notes (Signed)
Addended by: Gershon Crane A on: 02/13/2011 10:07 AM   Modules accepted: Orders

## 2011-02-21 ENCOUNTER — Ambulatory Visit (INDEPENDENT_AMBULATORY_CARE_PROVIDER_SITE_OTHER): Payer: Medicare Other | Admitting: Internal Medicine

## 2011-02-21 ENCOUNTER — Encounter: Payer: Self-pay | Admitting: Internal Medicine

## 2011-02-21 VITALS — BP 130/70 | HR 108 | Temp 97.9°F | Wt 196.0 lb

## 2011-02-21 DIAGNOSIS — I4891 Unspecified atrial fibrillation: Secondary | ICD-10-CM

## 2011-02-21 DIAGNOSIS — N289 Disorder of kidney and ureter, unspecified: Secondary | ICD-10-CM

## 2011-02-21 DIAGNOSIS — E785 Hyperlipidemia, unspecified: Secondary | ICD-10-CM

## 2011-02-21 DIAGNOSIS — E119 Type 2 diabetes mellitus without complications: Secondary | ICD-10-CM

## 2011-02-21 DIAGNOSIS — I10 Essential (primary) hypertension: Secondary | ICD-10-CM

## 2011-02-21 LAB — LIPID PANEL
Cholesterol: 123 mg/dL (ref 0–200)
HDL: 40.7 mg/dL (ref >39.00)
LDL Cholesterol: 61 mg/dL (ref 0–99)
VLDL: 21.8 mg/dL (ref 0.0–40.0)

## 2011-02-21 LAB — HEPATIC FUNCTION PANEL
Bilirubin, Direct: 0.2 mg/dL (ref 0.0–0.3)
Total Bilirubin: 0.9 mg/dL (ref 0.3–1.2)
Total Protein: 6.7 g/dL (ref 6.0–8.3)

## 2011-02-21 LAB — BASIC METABOLIC PANEL
BUN: 30 mg/dL — ABNORMAL HIGH (ref 6–23)
CO2: 27 mEq/L (ref 19–32)
Chloride: 101 mEq/L (ref 96–112)
Creatinine, Ser: 2.4 mg/dL — ABNORMAL HIGH (ref 0.4–1.5)
Potassium: 4.8 mEq/L (ref 3.5–5.1)

## 2011-02-21 LAB — HEMOGLOBIN A1C: Hgb A1c MFr Bld: 5.8 % (ref 4.6–6.5)

## 2011-02-21 NOTE — Progress Notes (Signed)
  Subjective:    Patient ID: Robert Salinas, male    DOB: [birth year], 77 y.o.   MRN: [id number]  HPI  Patient comes in for followup after acute febrile illness. Reviewed previous note by Dr. Clent Ridges. Reviewed laboratory work. Note elevated creatinine. This is a new finding for him. Patient denies any polyuria, polydipsia. He is feeling well after his febrile illness. His appetite has returned. Bowel movements have returned to normal. He does admit to ongoing fatigue but he had that even prior to recent illness. All the above was discussed with patient and his wife.  Past Medical History  Diagnosis Date  . Chickenpox   . Hx of colonic polyps   . Diabetes mellitus   . Hyperlipidemia   . Hypertension   . Atrial fibrillation   . Hypothyroidism   . Thrombocytopenia   . Dementia   . BPH (benign prostatic hyperplasia)    Past Surgical History  Procedure Date  . Appendectomy   . Prostatectomy     reports that he has never smoked. He has never used smokeless tobacco. He reports that he does not drink alcohol or use illicit drugs. family history is not on file. No Known Allergies   Review of Systems     patient denies chest pain, shortness of breath, orthopnea. Denies lower extremity edema, abdominal pain, change in appetite, change in bowel movements. Patient denies rashes, musculoskeletal complaints. No other specific complaints in a complete review of systems.   Objective:   Physical Exam  Elderly male in no acute distress. HEENT exam atraumatic, normocephalic, neck supple. Chest clear to auscultation cardiac exam S1-S2 are regular. Abdominal exam active bowel sounds, soft. Extremities no edema. Neurologic exam he is alert. He has a Salinas-based gait.      Assessment & Plan:

## 2011-02-21 NOTE — Assessment & Plan Note (Signed)
Creatinine 2.3 mg/dl at time of acute febrile illness (UTI) August 2012

## 2011-02-24 ENCOUNTER — Telehealth: Payer: Self-pay

## 2011-02-24 NOTE — Telephone Encounter (Signed)
Line busy

## 2011-02-24 NOTE — Telephone Encounter (Signed)
Message copied by Beverely Low on Fri Feb 24, 2011  4:15 PM ------      Message from: Lindley Magnus      Created: Tue Feb 21, 2011  9:45 PM       Call patient      Stop lisinopril      Stop metformin      See me 2-3 weeks      Start amlodipine 2.5 mg po qd #30/1 refill

## 2011-02-26 ENCOUNTER — Telehealth: Payer: Self-pay | Admitting: Internal Medicine

## 2011-02-26 MED ORDER — AMLODIPINE BESYLATE 5 MG PO TABS: 5.0000 mg | ORAL_TABLET | Freq: Every day | ORAL | Status: DC

## 2011-02-26 NOTE — Telephone Encounter (Signed)
Call the patient and have him discontinue lisinopril. Have him start amlodipine 5 mg by mouth daily #30/3 refills.

## 2011-02-26 NOTE — Assessment & Plan Note (Signed)
BP Readings from Last 3 Encounters:  02/21/11 130/70  02/09/11 140/90  10/10/10 146/94   Blood pressure is okay today. Note renal insufficiency while on lisinopril. I will ask him to discontinue the lisinopril.

## 2011-02-27 NOTE — Telephone Encounter (Signed)
Pt aware.

## 2011-03-10 ENCOUNTER — Ambulatory Visit: Payer: Medicare Other | Admitting: Internal Medicine

## 2011-03-13 NOTE — Telephone Encounter (Signed)
error 

## 2011-03-17 ENCOUNTER — Other Ambulatory Visit: Payer: Self-pay | Admitting: Internal Medicine

## 2011-03-20 ENCOUNTER — Ambulatory Visit (INDEPENDENT_AMBULATORY_CARE_PROVIDER_SITE_OTHER): Payer: Medicare Other

## 2011-03-20 DIAGNOSIS — Z23 Encounter for immunization: Secondary | ICD-10-CM

## 2011-04-11 ENCOUNTER — Ambulatory Visit (INDEPENDENT_AMBULATORY_CARE_PROVIDER_SITE_OTHER): Payer: Medicare Other | Admitting: Internal Medicine

## 2011-04-11 ENCOUNTER — Encounter: Payer: Self-pay | Admitting: Internal Medicine

## 2011-04-11 DIAGNOSIS — I10 Essential (primary) hypertension: Secondary | ICD-10-CM

## 2011-04-11 DIAGNOSIS — N289 Disorder of kidney and ureter, unspecified: Secondary | ICD-10-CM

## 2011-04-11 DIAGNOSIS — E119 Type 2 diabetes mellitus without complications: Secondary | ICD-10-CM

## 2011-04-11 DIAGNOSIS — E785 Hyperlipidemia, unspecified: Secondary | ICD-10-CM

## 2011-04-11 NOTE — Assessment & Plan Note (Signed)
Well controlled Continue same meds  Lab Results  Component Value Date   CHOL 123 02/21/2011   CHOL 129 08/26/2010   CHOL 122 04/28/2010   Lab Results  Component Value Date   HDL 40.70 02/21/2011   HDL 55.00 08/26/2010   HDL 45.60 04/28/2010   Lab Results  Component Value Date   LDLCALC 61 02/21/2011   LDLCALC 55 08/26/2010   LDLCALC 58 04/28/2010   Lab Results  Component Value Date   TRIG 109.0 02/21/2011   TRIG 97.0 08/26/2010   TRIG 93.0 04/28/2010   Lab Results  Component Value Date   CHOLHDL 3 02/21/2011   CHOLHDL 2 08/26/2010   CHOLHDL 3 04/28/2010   No results found for this basename: LDLDIRECT

## 2011-04-11 NOTE — Assessment & Plan Note (Signed)
BP Readings from Last 3 Encounters:  04/11/11 132/76  02/21/11 130/70  02/09/11 140/90   Adequate control Continue same meds

## 2011-04-11 NOTE — Assessment & Plan Note (Signed)
Lab Results  Component Value Date   HGBA1C 5.8 02/21/2011   Well controlled Continue same meds

## 2011-04-11 NOTE — Assessment & Plan Note (Signed)
Reviewed not from nephrology [facility]---likely related to DM and HTN

## 2011-04-11 NOTE — Progress Notes (Signed)
  Subjective:    Patient ID: Robert Salinas, male    DOB: 20-Oct-1926, 77 y.o.   MRN: [id number]  HPI  Lipids---tolerating meds  DM2---no home CBGs. Tolerating meds  Renal insuff: likely DM and HTN related  Past Medical History  Diagnosis Date  . Chickenpox   . Hx of colonic polyps   . Diabetes mellitus   . Hyperlipidemia   . Hypertension   . Atrial fibrillation   . Hypothyroidism   . Thrombocytopenia   . Dementia   . BPH (benign prostatic hyperplasia)    Past Surgical History  Procedure Date  . Appendectomy   . Prostatectomy     reports that he has never smoked. He has never used smokeless tobacco. He reports that he does not drink alcohol or use illicit drugs. family history is not on file. No Known Allergies   Review of Systems  patient denies chest pain, shortness of breath, orthopnea. Denies lower extremity edema, abdominal pain, change in appetite, change in bowel movements. Patient denies rashes, musculoskeletal complaints. No other specific complaints in a complete review of systems.      Objective:   Physical Exam  well-developed well-nourished male in no acute distress. HEENT exam atraumatic, normocephalic, neck supple without jugular venous distention. Chest clear to auscultation cardiac exam S1-S2 are regular. Abdominal exam overweight with bowel sounds, soft and nontender. Extremities no edema. Neurologic exam is alert with a normal gait.        Assessment & Plan:

## 2011-07-03 DIAGNOSIS — I129 Hypertensive chronic kidney disease with stage 1 through stage 4 chronic kidney disease, or unspecified chronic kidney disease: Secondary | ICD-10-CM | POA: Diagnosis not present

## 2011-07-03 DIAGNOSIS — E119 Type 2 diabetes mellitus without complications: Secondary | ICD-10-CM | POA: Diagnosis not present

## 2011-08-21 ENCOUNTER — Other Ambulatory Visit: Payer: Self-pay | Admitting: Internal Medicine

## 2011-09-13 ENCOUNTER — Other Ambulatory Visit: Payer: Self-pay | Admitting: Internal Medicine

## 2011-10-09 ENCOUNTER — Ambulatory Visit (INDEPENDENT_AMBULATORY_CARE_PROVIDER_SITE_OTHER): Payer: Medicare Other | Admitting: Internal Medicine

## 2011-10-09 VITALS — BP 142/72 | HR 72 | Temp 97.5°F | Wt 213.0 lb

## 2011-10-09 DIAGNOSIS — E039 Hypothyroidism, unspecified: Secondary | ICD-10-CM | POA: Diagnosis not present

## 2011-10-09 DIAGNOSIS — I10 Essential (primary) hypertension: Secondary | ICD-10-CM | POA: Diagnosis not present

## 2011-10-09 DIAGNOSIS — E119 Type 2 diabetes mellitus without complications: Secondary | ICD-10-CM | POA: Diagnosis not present

## 2011-10-09 LAB — BASIC METABOLIC PANEL
CO2: 24 mEq/L (ref 19–32)
Calcium: 9.2 mg/dL (ref 8.4–10.5)
Chloride: 109 mEq/L (ref 96–112)
Glucose, Bld: 84 mg/dL (ref 70–99)
Sodium: 142 mEq/L (ref 135–145)

## 2011-10-09 LAB — HEPATIC FUNCTION PANEL
ALT: 23 U/L (ref 0–53)
AST: 35 U/L (ref 0–37)
Albumin: 4.3 g/dL (ref 3.5–5.2)
Alkaline Phosphatase: 76 U/L (ref 39–117)
Bilirubin, Direct: 0.1 mg/dL (ref 0.0–0.3)
Total Bilirubin: 0.9 mg/dL (ref 0.3–1.2)
Total Protein: 6.7 g/dL (ref 6.0–8.3)

## 2011-10-09 LAB — LIPID PANEL
Cholesterol: 135 mg/dL (ref 0–200)
HDL: 64.2 mg/dL
LDL Cholesterol: 56 mg/dL (ref 0–99)
Total CHOL/HDL Ratio: 2
Triglycerides: 76 mg/dL (ref 0.0–149.0)
VLDL: 15.2 mg/dL (ref 0.0–40.0)

## 2011-10-09 LAB — HEMOGLOBIN A1C: Hgb A1c MFr Bld: 5.9 % (ref 4.6–6.5)

## 2011-10-09 NOTE — Patient Instructions (Signed)
Call your insurance company and see if they will cover shingles vaccine. If they will, call us and we will give it to you  

## 2011-10-09 NOTE — Progress Notes (Signed)
Patient ID: Robert Salinas, male   DOB: August 09, 1926, 78 y.o.   MRN: [id number]   patient comes in for followup of multiple medical problems including type 2 diabetes, hyperlipidemia, hypertension. The patient does not check blood sugar or blood pressure at home. The patetient does not follow an exercise or diet program. The patient denies any polyuria, polydipsia.  In the past the patient has gone to diabetic treatment center. The patient is tolerating medications  Without difficulty. The patient does admit to medication compliance.   Past Medical History  Diagnosis Date  . Chickenpox   . Hx of colonic polyps   . Diabetes mellitus   . Hyperlipidemia   . Hypertension   . Atrial fibrillation   . Hypothyroidism   . Thrombocytopenia   . Dementia   . BPH (benign prostatic hyperplasia)     History   Social History  . Marital Status: Married    Spouse Name: N/A    Number of Children: N/A  . Years of Education: N/A   Occupational History  . Not on file.   Social History Main Topics  . Smoking status: Never Smoker   . Smokeless tobacco: Never Used  . Alcohol Use: No  . Drug Use: No  . Sexually Active: Not on file   Other Topics Concern  . Not on file   Social History Narrative  . No narrative on file    Past Surgical History  Procedure Date  . Appendectomy   . Prostatectomy     No family history on file.  No Known Allergies  Current Outpatient Prescriptions on File Prior to Visit  Medication Sig Dispense Refill  . amLODipine (NORVASC) 5 MG tablet Take 1 tablet (5 mg total) by mouth daily.  30 tablet  11  . aspirin 81 MG tablet Take 81 mg by mouth daily.        Marland Kitchen atorvastatin (LIPITOR) 10 MG tablet TAKE 1 TABLET DAILY  90 tablet  1  . donepezil (ARICEPT) 5 MG tablet Take 1 tablet by mouth daily.      Marland Kitchen glimepiride (AMARYL) 2 MG tablet TAKE 1 TABLET DAILY BEFORE BREAKFAST  90 tablet  2  . levothyroxine (SYNTHROID, LEVOTHROID) 75 MCG tablet TAKE 1 TABLET DAILY  90 tablet   2     patient denies chest pain, shortness of breath, orthopnea. Denies lower extremity edema, abdominal pain, change in appetite, change in bowel movements. Patient denies rashes, musculoskeletal complaints. No other specific complaints in a complete review of systems.   BP 142/72  Pulse 72  Temp(Src) 97.5 F (36.4 C) (Oral)  Wt 213 lb (96.616 kg)  Well-developed well-nourished male in no acute distress. HEENT exam atraumatic, normocephalic, extraocular muscles are intact. Neck is supple. No jugular venous distention no thyromegaly. Chest clear to auscultation without increased work of breathing. Cardiac exam S1 and S2 are regular. Abdominal exam active bowel sounds, soft, nontender. Extremities no edema. Neurologic exam she is alert without any motor sensory deficits. Gait is normal.

## 2011-10-09 NOTE — Assessment & Plan Note (Signed)
Ok to continue current meds Not on ACEI-- renal insufficiency

## 2011-10-09 NOTE — Assessment & Plan Note (Signed)
Check tsh today 

## 2011-10-09 NOTE — Assessment & Plan Note (Signed)
Tolerating meds Has had recent eye and foot exams Check labs today

## 2011-10-10 ENCOUNTER — Ambulatory Visit: Payer: Medicare Other | Admitting: Internal Medicine

## 2011-10-10 LAB — TSH: TSH: 1.71 u[IU]/mL (ref 0.35–5.50)

## 2011-10-30 ENCOUNTER — Other Ambulatory Visit: Payer: Self-pay | Admitting: Internal Medicine

## 2011-11-19 ENCOUNTER — Other Ambulatory Visit: Payer: Self-pay | Admitting: Internal Medicine

## 2012-02-17 ENCOUNTER — Other Ambulatory Visit: Payer: Self-pay | Admitting: Internal Medicine

## 2012-02-19 ENCOUNTER — Other Ambulatory Visit: Payer: Self-pay | Admitting: Internal Medicine

## 2012-03-12 DIAGNOSIS — N2581 Secondary hyperparathyroidism of renal origin: Secondary | ICD-10-CM | POA: Diagnosis not present

## 2012-03-12 DIAGNOSIS — Z23 Encounter for immunization: Secondary | ICD-10-CM | POA: Diagnosis not present

## 2012-03-12 DIAGNOSIS — N184 Chronic kidney disease, stage 4 (severe): Secondary | ICD-10-CM | POA: Diagnosis not present

## 2012-03-12 DIAGNOSIS — D649 Anemia, unspecified: Secondary | ICD-10-CM | POA: Diagnosis not present

## 2012-03-18 ENCOUNTER — Telehealth: Payer: Self-pay | Admitting: *Deleted

## 2012-03-18 NOTE — Telephone Encounter (Signed)
Pt went to see Dr Briant Cedar and he told him that his BP was 166/98 and gave him a rx for Norvasc 10 mg.  Pts wife does wants Dr Cato Mulligan to prescribe the med please advise

## 2012-03-19 NOTE — Telephone Encounter (Signed)
Have Rx from [facility] filled. Please document new medication in list

## 2012-03-19 NOTE — Telephone Encounter (Signed)
pts wife aware, med list updated

## 2012-04-27 ENCOUNTER — Other Ambulatory Visit: Payer: Self-pay | Admitting: Internal Medicine

## 2012-05-17 ENCOUNTER — Other Ambulatory Visit: Payer: Self-pay | Admitting: Internal Medicine

## 2012-05-22 ENCOUNTER — Ambulatory Visit: Payer: Medicare Other | Admitting: Internal Medicine

## 2012-06-06 ENCOUNTER — Encounter: Payer: Self-pay | Admitting: Internal Medicine

## 2012-06-06 ENCOUNTER — Ambulatory Visit (INDEPENDENT_AMBULATORY_CARE_PROVIDER_SITE_OTHER): Payer: Medicare Other | Admitting: Internal Medicine

## 2012-06-06 VITALS — BP 142/64 | HR 56 | Temp 97.7°F | Wt 225.0 lb

## 2012-06-06 DIAGNOSIS — E039 Hypothyroidism, unspecified: Secondary | ICD-10-CM

## 2012-06-06 DIAGNOSIS — I10 Essential (primary) hypertension: Secondary | ICD-10-CM

## 2012-06-06 DIAGNOSIS — I4891 Unspecified atrial fibrillation: Secondary | ICD-10-CM

## 2012-06-06 DIAGNOSIS — E785 Hyperlipidemia, unspecified: Secondary | ICD-10-CM

## 2012-06-06 DIAGNOSIS — N289 Disorder of kidney and ureter, unspecified: Secondary | ICD-10-CM

## 2012-06-06 DIAGNOSIS — E119 Type 2 diabetes mellitus without complications: Secondary | ICD-10-CM | POA: Diagnosis not present

## 2012-06-06 LAB — CBC WITH DIFFERENTIAL/PLATELET
Basophils Absolute: 0.1 10*3/uL (ref 0.0–0.1)
Eosinophils Absolute: 0.1 10*3/uL (ref 0.0–0.7)
Lymphocytes Relative: 30.7 % (ref 12.0–46.0)
MCHC: 34 g/dL (ref 30.0–36.0)
Neutrophils Relative %: 58.3 % (ref 43.0–77.0)
Platelets: 142 10*3/uL — ABNORMAL LOW (ref 150.0–400.0)
RBC: 4.43 Mil/uL (ref 4.22–5.81)
RDW: 13.4 % (ref 11.5–14.6)

## 2012-06-06 LAB — HEPATIC FUNCTION PANEL
ALT: 17 U/L (ref 0–53)
Bilirubin, Direct: 0.2 mg/dL (ref 0.0–0.3)
Total Bilirubin: 1 mg/dL (ref 0.3–1.2)

## 2012-06-06 LAB — LIPID PANEL
Cholesterol: 138 mg/dL (ref 0–200)
LDL Cholesterol: 68 mg/dL (ref 0–99)
Triglycerides: 72 mg/dL (ref 0.0–149.0)
VLDL: 14.4 mg/dL (ref 0.0–40.0)

## 2012-06-06 LAB — BASIC METABOLIC PANEL
Chloride: 106 mEq/L (ref 96–112)
Creatinine, Ser: 1.7 mg/dL — ABNORMAL HIGH (ref 0.4–1.5)

## 2012-06-06 MED ORDER — DONEPEZIL HCL 10 MG PO TABS: 10.0000 mg | ORAL_TABLET | Freq: Every day | ORAL | Status: DC

## 2012-06-06 NOTE — Progress Notes (Signed)
Patient ID: Robert Genre., male   DOB: 1926-07-13, 78 y.o.   MRN: [id number]   patient comes in for followup of multiple medical problems including type 2 diabetes, hyperlipidemia, hypertension. The patient does not check blood sugar or blood pressure at home. The patetient does not follow an exercise or diet program. The patient denies any polyuria, polydipsia.  In the past the patient has gone to diabetic treatment center. The patient is tolerating medications  Without difficulty. The patient does admit to medication compliance.   Past Medical History  Diagnosis Date  . Chickenpox   . Hx of colonic polyps   . Diabetes mellitus   . Hyperlipidemia   . Hypertension   . Atrial fibrillation   . Hypothyroidism   . Thrombocytopenia   . Dementia   . BPH (benign prostatic hyperplasia)     History   Social History  . Marital Status: Married    Spouse Name: N/A    Number of Children: N/A  . Years of Education: N/A   Occupational History  . Not on file.   Social History Main Topics  . Smoking status: Never Smoker   . Smokeless tobacco: Never Used  . Alcohol Use: No  . Drug Use: No  . Sexually Active: Not on file   Other Topics Concern  . Not on file   Social History Narrative  . No narrative on file    Past Surgical History  Procedure Date  . Appendectomy   . Prostatectomy     No family history on file.  No Known Allergies  Current Outpatient Prescriptions on File Prior to Visit  Medication Sig Dispense Refill  . amLODipine (NORVASC) 10 MG tablet Take 10 mg by mouth daily.      Marland Kitchen aspirin 81 MG tablet Take 81 mg by mouth daily.        Marland Kitchen atorvastatin (LIPITOR) 10 MG tablet TAKE 1 TABLET DAILY  90 tablet  1  . donepezil (ARICEPT) 5 MG tablet TAKE 1 TABLET DAILY  90 tablet  3  . glimepiride (AMARYL) 2 MG tablet TAKE 1 TABLET DAILY BEFORE BREAKFAST  90 tablet  0  . levothyroxine (SYNTHROID, LEVOTHROID) 75 MCG tablet TAKE 1 TABLET DAILY  90 tablet  0     patient  denies chest pain, shortness of breath, orthopnea. Denies lower extremity edema, abdominal pain, change in appetite, change in bowel movements. Patient denies rashes, musculoskeletal complaints. No other specific complaints in a complete review of systems.   BP 146/58  Pulse 56  Temp 97.7 F (36.5 C) (Oral)  Wt 225 lb (102.059 kg) elderly male in no acute distress. HEENT exam atraumatic, normocephalic, neck supple without jugular venous distention. Chest clear to auscultation cardiac exam S1-S2 are regular. Abdominal exam overweight with bowel sounds, soft and nontender. Extremities no edema. Neurologic exam is alertusing a cane for ambulation

## 2012-06-07 ENCOUNTER — Ambulatory Visit: Payer: Medicare Other | Admitting: Internal Medicine

## 2012-06-08 NOTE — Assessment & Plan Note (Signed)
BP Readings from Last 3 Encounters:  06/06/12 142/64  10/09/11 142/72  04/11/11 132/76  given his age-- probably adequate control

## 2012-06-08 NOTE — Assessment & Plan Note (Signed)
Lab Results  Component Value Date   HGBA1C 7.2* 06/06/2012   Checked Labs are ok

## 2012-06-08 NOTE — Assessment & Plan Note (Signed)
Check labs 

## 2012-09-23 DIAGNOSIS — E119 Type 2 diabetes mellitus without complications: Secondary | ICD-10-CM | POA: Diagnosis not present

## 2012-10-24 ENCOUNTER — Other Ambulatory Visit: Payer: Self-pay | Admitting: *Deleted

## 2012-10-24 MED ORDER — AMLODIPINE BESYLATE 10 MG PO TABS: 10.0000 mg | ORAL_TABLET | Freq: Every day | ORAL | Status: DC

## 2012-11-15 ENCOUNTER — Other Ambulatory Visit: Payer: Self-pay | Admitting: Internal Medicine

## 2012-12-06 ENCOUNTER — Ambulatory Visit (INDEPENDENT_AMBULATORY_CARE_PROVIDER_SITE_OTHER): Payer: Medicare Other | Admitting: Internal Medicine

## 2012-12-06 ENCOUNTER — Encounter: Payer: Self-pay | Admitting: Internal Medicine

## 2012-12-06 VITALS — BP 142/84 | HR 68 | Temp 98.0°F | Wt 221.0 lb

## 2012-12-06 DIAGNOSIS — I10 Essential (primary) hypertension: Secondary | ICD-10-CM

## 2012-12-06 DIAGNOSIS — E1159 Type 2 diabetes mellitus with other circulatory complications: Secondary | ICD-10-CM

## 2012-12-06 DIAGNOSIS — G309 Alzheimer's disease, unspecified: Secondary | ICD-10-CM

## 2012-12-06 DIAGNOSIS — E119 Type 2 diabetes mellitus without complications: Secondary | ICD-10-CM

## 2012-12-06 DIAGNOSIS — E785 Hyperlipidemia, unspecified: Secondary | ICD-10-CM

## 2012-12-06 DIAGNOSIS — F0280 Dementia in other diseases classified elsewhere without behavioral disturbance: Secondary | ICD-10-CM

## 2012-12-06 DIAGNOSIS — I4891 Unspecified atrial fibrillation: Secondary | ICD-10-CM | POA: Diagnosis not present

## 2012-12-06 DIAGNOSIS — E039 Hypothyroidism, unspecified: Secondary | ICD-10-CM

## 2012-12-06 LAB — HEPATIC FUNCTION PANEL
ALT: 14 U/L (ref 0–53)
AST: 18 U/L (ref 0–37)
Albumin: 4.5 g/dL (ref 3.5–5.2)
Alkaline Phosphatase: 82 U/L (ref 39–117)
Total Protein: 7.3 g/dL (ref 6.0–8.3)

## 2012-12-06 LAB — MICROALBUMIN / CREATININE URINE RATIO: Microalb, Ur: 25.6 mg/dL — ABNORMAL HIGH (ref 0.0–1.9)

## 2012-12-06 LAB — BASIC METABOLIC PANEL
CO2: 27 mEq/L (ref 19–32)
Calcium: 9.4 mg/dL (ref 8.4–10.5)
GFR: 42.18 mL/min — ABNORMAL LOW (ref >60.00)
Sodium: 141 mEq/L (ref 135–145)

## 2012-12-06 LAB — LIPID PANEL
Cholesterol: 148 mg/dL (ref 0–200)
HDL: 58.7 mg/dL (ref >39.00)
Triglycerides: 90 mg/dL (ref 0.0–149.0)

## 2012-12-06 LAB — HEMOGLOBIN A1C: Hgb A1c MFr Bld: 7 % — ABNORMAL HIGH (ref 4.6–6.5)

## 2012-12-06 NOTE — Progress Notes (Signed)
Patient ID: Robert Salinas., male   DOB: [birth year], 79 y.o.   MRN: [id number]   patient comes in for followup of multiple medical problems including type 2 diabetes, hyperlipidemia, hypertension. The patient does not check blood sugar or blood pressure at home. The patetient does not follow an exercise or diet program. The patient denies any polyuria, polydipsia.  In the past the patient has gone to diabetic treatment center. The patient is tolerating medications  Without difficulty. The patient does admit to medication compliance.   Past Medical History  Diagnosis Date  . Chickenpox   . Hx of colonic polyps   . Diabetes mellitus   . Hyperlipidemia   . Hypertension   . Atrial fibrillation   . Hypothyroidism   . Thrombocytopenia   . Dementia   . BPH (benign prostatic hyperplasia)     History   Social History  . Marital Status: Married    Spouse Name: N/A    Number of Children: N/A  . Years of Education: N/A   Occupational History  . Not on file.   Social History Main Topics  . Smoking status: Never Smoker   . Smokeless tobacco: Never Used  . Alcohol Use: No  . Drug Use: No  . Sexually Active: Not on file   Other Topics Concern  . Not on file   Social History Narrative  . No narrative on file    Past Surgical History  Procedure Laterality Date  . Appendectomy    . Prostatectomy      No family history on file.  No Known Allergies  Current Outpatient Prescriptions on File Prior to Visit  Medication Sig Dispense Refill  . amLODipine (NORVASC) 10 MG tablet Take 1 tablet (10 mg total) by mouth daily.  90 tablet  1  . aspirin 81 MG tablet Take 81 mg by mouth daily.        Marland Kitchen atorvastatin (LIPITOR) 10 MG tablet TAKE 1 TABLET DAILY  90 tablet  1  . donepezil (ARICEPT) 10 MG tablet Take 1 tablet (10 mg total) by mouth at bedtime.  90 tablet  3  . glimepiride (AMARYL) 2 MG tablet TAKE 1 TABLET DAILY BEFORE BREAKFAST  90 tablet  1  . levothyroxine (SYNTHROID,  LEVOTHROID) 75 MCG tablet TAKE 1 TABLET DAILY  90 tablet  0   No current facility-administered medications on file prior to visit.     patient denies chest pain, shortness of breath, orthopnea. Denies lower extremity edema, abdominal pain, change in appetite, change in bowel movements. Patient denies rashes, musculoskeletal complaints. No other specific complaints in a complete review of systems.   BP 142/84  Pulse 68  Temp(Src) 98 F (36.7 C) (Oral)  Wt 221 lb (100.245 kg)  BMI 31.25 kg/m2  elderly male in no acute distress. HEENT exam atraumatic, normocephalic, neck supple without jugular venous distention. Chest clear to auscultation cardiac exam S1-S2 are regular. Abdominal exam overweight with bowel sounds, soft and nontender. Extremities no edema. Neurologic exam is alert with a normal gait.

## 2012-12-06 NOTE — Assessment & Plan Note (Signed)
Check labs today.

## 2012-12-07 NOTE — Assessment & Plan Note (Signed)
Lab Results  Component Value Date   HGBA1C 7.0* 12/06/2012   Check labs

## 2012-12-07 NOTE — Assessment & Plan Note (Signed)
BP Readings from Last 3 Encounters:  12/06/12 142/84  06/06/12 142/64  10/09/11 142/72   Adequate control Continue same meds

## 2012-12-09 ENCOUNTER — Other Ambulatory Visit: Payer: Self-pay | Admitting: *Deleted

## 2012-12-09 MED ORDER — DONEPEZIL HCL 10 MG PO TABS: 10.0000 mg | ORAL_TABLET | Freq: Every day | ORAL | Status: DC

## 2012-12-26 ENCOUNTER — Telehealth: Payer: Self-pay | Admitting: Internal Medicine

## 2012-12-26 ENCOUNTER — Other Ambulatory Visit: Payer: Self-pay

## 2012-12-26 MED ORDER — LEVOTHYROXINE SODIUM 75 MCG PO TABS: ORAL_TABLET | ORAL | Status: DC

## 2012-12-26 NOTE — Telephone Encounter (Signed)
Pt wife called and stated that the PT needed a 3 month supply of levothyroxine (SYNTHROID, LEVOTHROID) 75 MCG tablet, [facility]. Please assist.

## 2013-01-21 ENCOUNTER — Other Ambulatory Visit: Payer: Self-pay | Admitting: Internal Medicine

## 2013-03-03 ENCOUNTER — Other Ambulatory Visit: Payer: Self-pay | Admitting: Internal Medicine

## 2013-03-17 DIAGNOSIS — Z23 Encounter for immunization: Secondary | ICD-10-CM | POA: Diagnosis not present

## 2013-03-27 DIAGNOSIS — N2581 Secondary hyperparathyroidism of renal origin: Secondary | ICD-10-CM | POA: Diagnosis not present

## 2013-04-21 ENCOUNTER — Other Ambulatory Visit: Payer: Self-pay | Admitting: Internal Medicine

## 2013-06-01 ENCOUNTER — Other Ambulatory Visit: Payer: Self-pay | Admitting: Internal Medicine

## 2013-06-01 NOTE — Assessment & Plan Note (Signed)
Adequate control Continue meds 

## 2013-06-01 NOTE — Progress Notes (Signed)
DM- tolerating meds No home cbgs. Wife looks after meds.  htn- tolerating meds and is compliants  Hypothyroid- tolerating meds and is compliant  Dementia- he is able to perform his adls. Wife helps with meds  Past Medical History  Diagnosis Date  . Chickenpox   . Hx of colonic polyps   . Diabetes mellitus   . Hyperlipidemia   . Hypertension   . Atrial fibrillation   . Hypothyroidism   . Thrombocytopenia   . Dementia   . BPH (benign prostatic hyperplasia)     History   Social History  . Marital Status: Married    Spouse Name: N/A    Number of Children: N/A  . Years of Education: N/A   Occupational History  . Not on file.   Social History Main Topics  . Smoking status: Never Smoker   . Smokeless tobacco: Never Used  . Alcohol Use: No  . Drug Use: No  . Sexual Activity: Not on file   Other Topics Concern  . Not on file   Social History Narrative  . No narrative on file    Past Surgical History  Procedure Laterality Date  . Appendectomy    . Prostatectomy      No Known Allergies  Current Outpatient Prescriptions on File Prior to Visit  Medication Sig Dispense Refill  . amLODipine (NORVASC) 10 MG tablet Take 1 tablet (10 mg total) by mouth daily.  90 tablet  1  . aspirin 81 MG tablet Take 81 mg by mouth daily.        Marland Kitchen atorvastatin (LIPITOR) 10 MG tablet TAKE 1 TABLET DAILY  90 tablet  3  . donepezil (ARICEPT) 10 MG tablet Take 1 tablet (10 mg total) by mouth at bedtime.  90 tablet  3  . glimepiride (AMARYL) 2 MG tablet TAKE 1 TABLET DAILY BEFORE BREAKFAST  90 tablet  0  . levothyroxine (SYNTHROID, LEVOTHROID) 75 MCG tablet TAKE 1 TABLET DAILY  90 tablet  0   No current facility-administered medications on file prior to visit.     patient denies chest pain, shortness of breath, orthopnea. Denies lower extremity edema, abdominal pain, change in appetite, change in bowel movements. Patient denies rashes, musculoskeletal complaints. No other specific  complaints in a complete review of systems.   Reviewed vitals  elderly male in no acute distress. HEENT exam atraumatic, normocephalic, neck supple without jugular venous distention. Chest clear to auscultation cardiac exam S1-S2 are regular. Abdominal exam overweight with bowel sounds, soft and nontender. Extremities no edema. Neurologic exam is alert with a normal gait. Scalp- has non healing lesion 1.5cm  Atrial fibrillation No recurrence known. He would not be a good anticoagulation candidate  HYPERTENSION Adequate control Continue meds  DIABETES MELLITUS, TYPE II Sill check labs today  Renal insufficiency Will check labs today  HYPERLIPIDEMIA Previously controlled  Hypothyroid Lab Results  Component Value Date   TSH 5.27 12/06/2012   Previously controlled

## 2013-06-01 NOTE — Assessment & Plan Note (Signed)
Sill check labs today

## 2013-06-01 NOTE — Assessment & Plan Note (Signed)
No recurrence known. He would not be a good anticoagulation candidate

## 2013-06-02 ENCOUNTER — Encounter: Payer: Self-pay | Admitting: Internal Medicine

## 2013-06-02 ENCOUNTER — Ambulatory Visit (INDEPENDENT_AMBULATORY_CARE_PROVIDER_SITE_OTHER): Payer: Medicare Other | Admitting: Internal Medicine

## 2013-06-02 VITALS — BP 150/82 | HR 76 | Temp 97.8°F | Ht 70.5 in | Wt 224.0 lb

## 2013-06-02 DIAGNOSIS — L989 Disorder of the skin and subcutaneous tissue, unspecified: Secondary | ICD-10-CM | POA: Diagnosis not present

## 2013-06-02 DIAGNOSIS — E119 Type 2 diabetes mellitus without complications: Secondary | ICD-10-CM | POA: Diagnosis not present

## 2013-06-02 DIAGNOSIS — I10 Essential (primary) hypertension: Secondary | ICD-10-CM

## 2013-06-02 DIAGNOSIS — I4891 Unspecified atrial fibrillation: Secondary | ICD-10-CM

## 2013-06-02 DIAGNOSIS — N289 Disorder of kidney and ureter, unspecified: Secondary | ICD-10-CM

## 2013-06-02 DIAGNOSIS — E785 Hyperlipidemia, unspecified: Secondary | ICD-10-CM

## 2013-06-02 DIAGNOSIS — E039 Hypothyroidism, unspecified: Secondary | ICD-10-CM

## 2013-06-02 LAB — BASIC METABOLIC PANEL
BUN: 20 mg/dL (ref 6–23)
CO2: 30 mEq/L (ref 19–32)
Chloride: 108 mEq/L (ref 96–112)
Creatinine, Ser: 1.6 mg/dL — ABNORMAL HIGH (ref 0.4–1.5)
Glucose, Bld: 167 mg/dL — ABNORMAL HIGH (ref 70–99)
Potassium: 4.7 mEq/L (ref 3.5–5.1)

## 2013-06-02 NOTE — Assessment & Plan Note (Signed)
Previously controlled 

## 2013-06-02 NOTE — Assessment & Plan Note (Signed)
Will check labs today

## 2013-06-02 NOTE — Progress Notes (Signed)
Pre visit review using our clinic review tool, if applicable. No additional management support is needed unless otherwise documented below in the visit note. 

## 2013-06-02 NOTE — Assessment & Plan Note (Signed)
Lab Results  Component Value Date   TSH 5.27 12/06/2012   Previously controlled

## 2013-07-04 ENCOUNTER — Other Ambulatory Visit: Payer: Self-pay | Admitting: Physician Assistant

## 2013-07-04 DIAGNOSIS — D485 Neoplasm of uncertain behavior of skin: Secondary | ICD-10-CM | POA: Diagnosis not present

## 2013-07-04 DIAGNOSIS — C4442 Squamous cell carcinoma of skin of scalp and neck: Secondary | ICD-10-CM | POA: Diagnosis not present

## 2013-07-11 ENCOUNTER — Telehealth: Payer: Self-pay | Admitting: *Deleted

## 2013-07-11 NOTE — Telephone Encounter (Signed)
Pt's wife seen something on TV that you could take a medication in addition to Aricept.  She was wanting to know if Dr Leanne Chang could prescribe it to her husband.  She feels like the aricept is just not enough.  Asked pt if the commercial was Namenda and she thinks that is the medicine.  Do you want to see the pt? Please advise

## 2013-07-13 NOTE — Telephone Encounter (Signed)
namenda 10 mg po qd #90/3 refills

## 2013-07-14 MED ORDER — MEMANTINE HCL 10 MG PO TABS: 10.0000 mg | ORAL_TABLET | Freq: Two times a day (BID) | ORAL | Status: DC

## 2013-07-14 NOTE — Telephone Encounter (Signed)
rx sent in electronically, pt aware 

## 2013-07-20 ENCOUNTER — Other Ambulatory Visit: Payer: Self-pay | Admitting: Internal Medicine

## 2013-08-30 ENCOUNTER — Other Ambulatory Visit: Payer: Self-pay | Admitting: Internal Medicine

## 2013-10-19 ENCOUNTER — Other Ambulatory Visit: Payer: Self-pay | Admitting: Internal Medicine

## 2013-11-06 ENCOUNTER — Other Ambulatory Visit: Payer: Self-pay | Admitting: Internal Medicine

## 2013-11-17 LAB — HM DIABETES EYE EXAM

## 2013-12-08 ENCOUNTER — Encounter: Payer: Self-pay | Admitting: Internal Medicine

## 2013-12-08 ENCOUNTER — Ambulatory Visit (INDEPENDENT_AMBULATORY_CARE_PROVIDER_SITE_OTHER): Payer: Medicare Other | Admitting: Internal Medicine

## 2013-12-08 VITALS — BP 133/60 | HR 60 | Temp 97.8°F | Ht 70.5 in | Wt 224.0 lb

## 2013-12-08 DIAGNOSIS — E785 Hyperlipidemia, unspecified: Secondary | ICD-10-CM | POA: Diagnosis not present

## 2013-12-08 DIAGNOSIS — I10 Essential (primary) hypertension: Secondary | ICD-10-CM

## 2013-12-08 DIAGNOSIS — I4891 Unspecified atrial fibrillation: Secondary | ICD-10-CM

## 2013-12-08 DIAGNOSIS — N289 Disorder of kidney and ureter, unspecified: Secondary | ICD-10-CM | POA: Diagnosis not present

## 2013-12-08 DIAGNOSIS — E119 Type 2 diabetes mellitus without complications: Secondary | ICD-10-CM | POA: Diagnosis not present

## 2013-12-08 DIAGNOSIS — Z66 Do not resuscitate: Secondary | ICD-10-CM | POA: Insufficient documentation

## 2013-12-08 LAB — BASIC METABOLIC PANEL
BUN: 19 mg/dL (ref 6–23)
CO2: 30 meq/L (ref 19–32)
Calcium: 9.1 mg/dL (ref 8.4–10.5)
Chloride: 107 mEq/L (ref 96–112)
Creatinine, Ser: 1.6 mg/dL — ABNORMAL HIGH (ref 0.4–1.5)
GFR: 44.22 mL/min — AB (ref >60.00)
Glucose, Bld: 119 mg/dL — ABNORMAL HIGH (ref 70–99)
Potassium: 4.5 mEq/L (ref 3.5–5.1)
SODIUM: 142 meq/L (ref 135–145)

## 2013-12-08 LAB — HM DIABETES FOOT EXAM

## 2013-12-08 LAB — HEMOGLOBIN A1C: Hgb A1c MFr Bld: 6.6 % — ABNORMAL HIGH (ref 4.6–6.5)

## 2013-12-08 NOTE — Progress Notes (Signed)
Dm Lab Results  Component Value Date   HGBA1C 6.4 06/02/2013  on  amaryl No home CBGs  Hypothyroid Lab Results  Component Value Date   TSH 5.27 12/06/2012  needs f/u labs  htn- tolerating meds No home bps  Memory- he refused to take namenda Wife states memory loss is progressive but still mild.  It is interesting that he is able to recall the events around attempting to get namenda and the cost.   Past Medical History  Diagnosis Date  . Chickenpox   . Hx of colonic polyps   . Diabetes mellitus   . Hyperlipidemia   . Hypertension   . Atrial fibrillation   . Hypothyroidism   . Thrombocytopenia   . Dementia   . BPH (benign prostatic hyperplasia)     History   Social History  . Marital Status: Married    Spouse Name: N/A    Number of Children: N/A  . Years of Education: N/A   Occupational History  . Not on file.   Social History Main Topics  . Smoking status: Never Smoker   . Smokeless tobacco: Never Used  . Alcohol Use: No  . Drug Use: No  . Sexual Activity: Not on file   Other Topics Concern  . Not on file   Social History Narrative  . No narrative on file    Past Surgical History  Procedure Laterality Date  . Appendectomy    . Prostatectomy      No family history on file.  No Known Allergies  Current Outpatient Prescriptions on File Prior to Visit  Medication Sig Dispense Refill  . amLODipine (NORVASC) 10 MG tablet TAKE 1 TABLET DAILY  90 tablet  2  . aspirin 81 MG tablet Take 81 mg by mouth daily.        Marland Kitchen atorvastatin (LIPITOR) 10 MG tablet TAKE 1 TABLET DAILY  90 tablet  3  . donepezil (ARICEPT) 10 MG tablet TAKE 1 TABLET AT BEDTIME  90 tablet  2  . glimepiride (AMARYL) 2 MG tablet TAKE 1 TABLET DAILY BEFORE BREAKFAST  90 tablet  1  . levothyroxine (SYNTHROID, LEVOTHROID) 75 MCG tablet TAKE 1 TABLET DAILY  90 tablet  0  . memantine (NAMENDA) 10 MG tablet Take 1 tablet (10 mg total) by mouth 2 (two) times daily.  90 tablet  3   No current  facility-administered medications on file prior to visit.     patient denies chest pain, shortness of breath, orthopnea. Denies lower extremity edema, abdominal pain, change in appetite, change in bowel movements. Patient denies rashes, musculoskeletal complaints. No other specific complaints in a complete review of systems.   Reviewed vitals elderly male in no acute distress. HEENT exam atraumatic, normocephalic, neck supple without jugular venous distention. Chest clear to auscultation cardiac exam S1-S2 are regular. Abdominal exam overweight with bowel sounds, soft and nontender. Extremities 2+ edema Walks with a cane.   Atrial fibrillation Currently in sinus rhythm. He is on an aspirin a day.  HYPERTENSION BP: 133/60 mmHg  Controlled. Continue current medications.  DIABETES MELLITUS, TYPE II Will check laboratory work today.

## 2013-12-08 NOTE — Progress Notes (Signed)
Pre visit review using our clinic review tool, if applicable. No additional management support is needed unless otherwise documented below in the visit note. 

## 2013-12-09 ENCOUNTER — Telehealth: Payer: Self-pay | Admitting: Internal Medicine

## 2013-12-09 NOTE — Telephone Encounter (Signed)
Relevant patient education mailed to patient.  

## 2013-12-11 NOTE — Assessment & Plan Note (Signed)
BP: 133/60 mmHg  Controlled. Continue current medications.

## 2013-12-11 NOTE — Assessment & Plan Note (Signed)
Currently in sinus rhythm. He is on an aspirin a day.

## 2013-12-11 NOTE — Assessment & Plan Note (Signed)
Will check laboratory work today.

## 2013-12-17 ENCOUNTER — Other Ambulatory Visit: Payer: Self-pay | Admitting: *Deleted

## 2013-12-17 MED ORDER — MEMANTINE HCL 10 MG PO TABS: 10.0000 mg | ORAL_TABLET | Freq: Two times a day (BID) | ORAL | Status: DC

## 2013-12-24 ENCOUNTER — Other Ambulatory Visit: Payer: Self-pay | Admitting: Internal Medicine

## 2013-12-25 ENCOUNTER — Other Ambulatory Visit: Payer: Self-pay | Admitting: Internal Medicine

## 2014-02-11 DIAGNOSIS — S8010XA Contusion of unspecified lower leg, initial encounter: Secondary | ICD-10-CM | POA: Diagnosis not present

## 2014-02-16 ENCOUNTER — Telehealth: Payer: Self-pay | Admitting: Internal Medicine

## 2014-02-16 NOTE — Telephone Encounter (Signed)
Wife called to say that ever since patient has been on this medicine his behavior has been really bad. The med is memantine (NAMENDA) 10 MG tablet.

## 2014-02-17 DIAGNOSIS — S81809A Unspecified open wound, unspecified lower leg, initial encounter: Secondary | ICD-10-CM | POA: Diagnosis not present

## 2014-02-17 DIAGNOSIS — S8010XA Contusion of unspecified lower leg, initial encounter: Secondary | ICD-10-CM | POA: Diagnosis not present

## 2014-02-17 DIAGNOSIS — S81009A Unspecified open wound, unspecified knee, initial encounter: Secondary | ICD-10-CM | POA: Diagnosis not present

## 2014-02-18 NOTE — Telephone Encounter (Signed)
Spoke with wife.  An appointment was made to discuss Namenda only.

## 2014-02-25 ENCOUNTER — Ambulatory Visit (INDEPENDENT_AMBULATORY_CARE_PROVIDER_SITE_OTHER): Payer: Medicare Other | Admitting: Family Medicine

## 2014-02-25 ENCOUNTER — Encounter: Payer: Self-pay | Admitting: Family Medicine

## 2014-02-25 VITALS — BP 110/80 | HR 56 | Temp 98.7°F | Wt 219.0 lb

## 2014-02-25 DIAGNOSIS — G309 Alzheimer's disease, unspecified: Principal | ICD-10-CM

## 2014-02-25 DIAGNOSIS — F0280 Dementia in other diseases classified elsewhere without behavioral disturbance: Secondary | ICD-10-CM

## 2014-02-25 MED ORDER — MEMANTINE HCL 5 MG PO TABS: 10.0000 mg | ORAL_TABLET | Freq: Two times a day (BID) | ORAL | Status: DC

## 2014-02-25 NOTE — Assessment & Plan Note (Signed)
Continue Aricept. Decreased Namenda to 5 mg twice a day as may have been causing irritability per wife. Followup 2 weeks after starting medicine to see if irritability returns. Need to complete MMSE at some point for monitoring of disease state. Extensive counseling performed today both for patient in avoiding certain activities as well as wife for caregiver burden

## 2014-02-25 NOTE — Progress Notes (Signed)
  [name], MD Phone: [phone]  Subjective:   Robert Salinas. is a 80 y.o. year old very pleasant male patient who presents with the following:  Dementia Irritability increased on namenda. The patient's wife took the patient off the medicine and states irritability improved. ROS-no recent worsening confusion. The patient does do some things that concern the wife including mowing the grass on an embankment at their house. He was injured about a week ago when the lawnmower fell on him and he had to go to urgent care. He is finishing a course of Keflex and the leg is improving.  Past Medical History- Patient Active Problem List   Diagnosis Date Noted  . DNR (do not resuscitate) 12/08/2013    Priority: High  . Renal insufficiency 02/21/2011    Priority: High  . DIABETES MELLITUS, TYPE II 06/09/2009    Priority: High  . ALZHEIMER'S DISEASE, EARLY 06/09/2009    Priority: High  . Atrial fibrillation 06/09/2009    Priority: High  . Hypothyroid 10/09/2011    Priority: Medium  . HYPERLIPIDEMIA 06/09/2009    Priority: Medium  . HYPERTENSION 06/09/2009    Priority: Medium  . COLONIC POLYPS, HX OF 06/09/2009    Priority: Low   Medications- reviewed and updated Current Outpatient Prescriptions  Medication Sig Dispense Refill  . amLODipine (NORVASC) 10 MG tablet TAKE 1 TABLET DAILY  90 tablet  2  . aspirin 81 MG tablet Take 81 mg by mouth daily.        Marland Kitchen atorvastatin (LIPITOR) 10 MG tablet TAKE 1 TABLET DAILY  90 tablet  2  . donepezil (ARICEPT) 10 MG tablet TAKE 1 TABLET AT BEDTIME  90 tablet  2  . glimepiride (AMARYL) 2 MG tablet TAKE 1 TABLET DAILY BEFORE BREAKFAST  90 tablet  3  . levothyroxine (SYNTHROID, LEVOTHROID) 75 MCG tablet TAKE 1 TABLET DAILY  90 tablet  0  . memantine (NAMENDA) 5 MG tablet Take 2 tablets (10 mg total) by mouth 2 (two) times daily.  180 tablet  3   No current facility-administered medications for this visit.    Objective: BP 110/80  Pulse  56  Temp(Src) 98.7 F (37.1 C)  Wt 219 lb (99.338 kg) Gen: NAD, resting comfortably in chair, appears stated age CV: RRR no murmurs rubs or gallops Lungs: CTAB no crackles, wheeze, rhonchi Ext: 1+ edema right leg with 2 healing scars, some surrounding erythema with only mild warmth (on keflex currently by urgent care)  Assessment/Plan:  ALZHEIMER'S DISEASE, EARLY Continue Aricept. Decreased Namenda to 5 mg twice a day as may have been causing irritability per wife. Followup 2 weeks after starting medicine to see if irritability returns. Need to complete MMSE at some point for monitoring of disease state. Extensive counseling performed today both for patient in avoiding certain activities as well as wife for caregiver burden   >50% of 30 minute office visit was spent on counseling (see a/p above) and coordination of care  Meds ordered this encounter  Medications  . memantine (NAMENDA) 5 MG tablet    Sig: Take 2 tablets (10 mg total) by mouth 2 (two) times daily.    Dispense:  180 tablet    Refill:  3

## 2014-02-25 NOTE — Patient Instructions (Signed)
Let's try 5mg  of namenda instead of 10mg  to see if that helps with the irritability (possible that this medicatoin ws not the cause though).   Check in with me 2 weeks after you start taking this dose.   Look forward to our establish visit in a few months Health Maintenance Due  Topic Date Due  . Zostavax  02/20/1989  . Influenza Vaccine  01/17/2014

## 2014-03-23 ENCOUNTER — Encounter: Payer: Self-pay | Admitting: Family Medicine

## 2014-03-23 ENCOUNTER — Ambulatory Visit (INDEPENDENT_AMBULATORY_CARE_PROVIDER_SITE_OTHER): Payer: Medicare Other | Admitting: Family Medicine

## 2014-03-23 VITALS — BP 160/82 | HR 60 | Temp 97.8°F | Wt 216.0 lb

## 2014-03-23 DIAGNOSIS — Z23 Encounter for immunization: Secondary | ICD-10-CM | POA: Diagnosis not present

## 2014-03-23 DIAGNOSIS — G309 Alzheimer's disease, unspecified: Secondary | ICD-10-CM | POA: Diagnosis not present

## 2014-03-23 DIAGNOSIS — E119 Type 2 diabetes mellitus without complications: Secondary | ICD-10-CM | POA: Diagnosis not present

## 2014-03-23 DIAGNOSIS — F0280 Dementia in other diseases classified elsewhere without behavioral disturbance: Secondary | ICD-10-CM

## 2014-03-23 NOTE — Progress Notes (Signed)
  [name], MD Phone: [phone]  Subjective:   Robert Salinas. is a 80 y.o. year old very pleasant male patient who presents with the following:  Dementia/irritability improved, dementia stable Per wife, much improved on lower dose namenda. Is sleeping much better. Less irritability with everyone except wife at times.  ROS-no headache or blurry vision  Diabetes Doing well on oral agents with last a1c <7. Patient needs microalbumin/creatinine ratio today.   Past Medical History- Patient Active Problem List   Diagnosis Date Noted  . DNR (do not resuscitate) 12/08/2013    Priority: High  . Renal insufficiency 02/21/2011    Priority: High  . Diabetes mellitus type II, controlled 06/09/2009    Priority: High  . ALZHEIMER'S DISEASE, EARLY 06/09/2009    Priority: High  . Atrial fibrillation 06/09/2009    Priority: High  . Hypothyroid 10/09/2011    Priority: Medium  . HYPERLIPIDEMIA 06/09/2009    Priority: Medium  . HYPERTENSION 06/09/2009    Priority: Medium  . COLONIC POLYPS, HX OF 06/09/2009    Priority: Low   Medications- reviewed and updated Current Outpatient Prescriptions  Medication Sig Dispense Refill  . amLODipine (NORVASC) 10 MG tablet TAKE 1 TABLET DAILY  90 tablet  2  . aspirin 81 MG tablet Take 81 mg by mouth daily.        Marland Kitchen atorvastatin (LIPITOR) 10 MG tablet TAKE 1 TABLET DAILY  90 tablet  2  . donepezil (ARICEPT) 10 MG tablet TAKE 1 TABLET AT BEDTIME  90 tablet  2  . glimepiride (AMARYL) 2 MG tablet TAKE 1 TABLET DAILY BEFORE BREAKFAST  90 tablet  3  . levothyroxine (SYNTHROID, LEVOTHROID) 75 MCG tablet TAKE 1 TABLET DAILY  90 tablet  0  . memantine (NAMENDA) 5 MG tablet Take 2 tablets (10 mg total) by mouth 2 (two) times daily.  180 tablet  3   No current facility-administered medications for this visit.    Objective: BP 160/82  Pulse 60  Temp(Src) 97.8 F (36.6 C)  Wt 216 lb (97.977 kg) Gen: NAD, resting comfortably, not irritable MMSE  20/30 Skin: warm, dry, right hand noted to have a 1 cm area raised about 2-3 mm with central area that was scabbed over. Patient states he hit his hand and wife confirms this sometime in the last few weeks.   Assessment/Plan:  ALZHEIMER'S DISEASE, EARLY MMSE 20/30 today. Continue aricept and namenda at lower dose of 5mg  BID> most acceptable side effects at this point.    To update diabetes health maintenance Orders Placed This Encounter  Procedures  . Microalbumin / creatinine urine ratio    [place]    BP noted to be elevated after visit. Last 2 normal so will recheck at next visit. Wife had told me she had forgot her BP medicine x 1 today and as she helps manages husbands meds, possible this was forgotten as well.

## 2014-03-23 NOTE — Patient Instructions (Signed)
Glad the lower dose is treating you better.   Health Maintenance Due  Topic Date Due  . Zostavax - call your insurance to see  02/20/1989  . Urine Microalbumin -get this today 12/06/2013

## 2014-03-23 NOTE — Assessment & Plan Note (Signed)
MMSE 20/30 today. Continue aricept and namenda at lower dose of 5mg  BID> most acceptable side effects at this point.

## 2014-03-23 NOTE — Assessment & Plan Note (Signed)
Well controlled previously on low dose amaryl. Check microalbumin/cr ratio today.

## 2014-03-24 LAB — MICROALBUMIN / CREATININE URINE RATIO
Creatinine,U: 171.8 mg/dL
MICROALB/CREAT RATIO: 16 mg/g (ref 0.0–30.0)
Microalb, Ur: 27.5 mg/dL — ABNORMAL HIGH (ref 0.0–1.9)

## 2014-04-15 ENCOUNTER — Other Ambulatory Visit: Payer: Self-pay

## 2014-04-15 MED ORDER — LEVOTHYROXINE SODIUM 75 MCG PO TABS: 75.0000 ug | ORAL_TABLET | Freq: Every day | ORAL | Status: DC

## 2014-05-06 ENCOUNTER — Other Ambulatory Visit: Payer: Self-pay | Admitting: Internal Medicine

## 2014-06-10 ENCOUNTER — Ambulatory Visit (INDEPENDENT_AMBULATORY_CARE_PROVIDER_SITE_OTHER): Payer: Medicare Other | Admitting: Family Medicine

## 2014-06-10 ENCOUNTER — Encounter: Payer: Self-pay | Admitting: Family Medicine

## 2014-06-10 VITALS — BP 130/64 | HR 74 | Temp 97.5°F | Wt 219.0 lb

## 2014-06-10 DIAGNOSIS — E119 Type 2 diabetes mellitus without complications: Secondary | ICD-10-CM | POA: Diagnosis not present

## 2014-06-10 DIAGNOSIS — I4891 Unspecified atrial fibrillation: Secondary | ICD-10-CM | POA: Diagnosis not present

## 2014-06-10 DIAGNOSIS — I10 Essential (primary) hypertension: Secondary | ICD-10-CM | POA: Diagnosis not present

## 2014-06-10 MED ORDER — MEMANTINE HCL 5 MG PO TABS: 5.0000 mg | ORAL_TABLET | Freq: Two times a day (BID) | ORAL | Status: DC

## 2014-06-10 NOTE — Patient Instructions (Signed)
Let's have a follow up morning appointment. Heart rate sounded irregular and i wanted to do EKG but you guys needed to get home. We would have to decide at that time if you did have atrial fibrillation if we even wanted to do blood thinners.   We will also do blood work at follow up.

## 2014-06-10 NOTE — Progress Notes (Signed)
Robert Reddish, MD Phone: [phone]  Subjective:  Patient presents today to establish care with me as their new primary care provider. Patient was formerly a patient of Dr. Leanne Chang. Chief complaint-noted.   Atrial fibrillation- previously in sinus, paroxysmal Patient/wife deny any history of this since 2010. He is on daily aspirin ROS- no lightheadedness/dizziness/chest pain/shortness of breath  DIABETES Type II-well controlled previously on armaryl 2mg   Lab Results  Component Value Date   HGBA1C 6.6* 12/08/2013   HGBA1C 6.4 06/02/2013   HGBA1C 7.0* 12/06/2012   Medications taking and tolerating-yes Blood Sugars per patient-fasting-does not check Regular Exercise-no On Aspirin-yes On statin-yes Daily foot monitoring-yes  ROS- Denies Vision changes, feet or hand numbness/pain/tingling. Denies Hypoglycemia symptoms (shaky, sweaty, hungry, weak anxious, tremor, palpitations, confusion, behavior change).   Hypertension-controlled on amlodipine 10mg   BP Readings from Last 3 Encounters:  06/10/14 130/64  03/23/14 160/82  02/25/14 110/80   Home BP monitoring-no Compliant with medications-yes without side effects ROS-Denies any CP, HA, SOB, blurry vision, worsening LE edema, transient weakness.   The following were reviewed and entered/updated in epic: Past Medical History  Diagnosis Date  . Chickenpox   . Hx of colonic polyps   . Diabetes mellitus   . Hyperlipidemia   . Hypertension   . Atrial fibrillation   . Hypothyroidism   . Thrombocytopenia   . Dementia   . BPH (benign prostatic hyperplasia)   . COLONIC POLYPS, HX OF 06/09/2009   Patient Active Problem List   Diagnosis Date Noted  . DNR (do not resuscitate) 12/08/2013    Priority: High  . CKD (chronic kidney disease), stage III 02/21/2011    Priority: High  . Diabetes mellitus type II, controlled 06/09/2009    Priority: High  . ALZHEIMER'S DISEASE, EARLY 06/09/2009    Priority: High  . Atrial  fibrillation 06/09/2009    Priority: High  . Hypothyroid 10/09/2011    Priority: Medium  . Hyperlipidemia 06/09/2009    Priority: Medium  . Essential hypertension 06/09/2009    Priority: Medium   Past Surgical History  Procedure Laterality Date  . Appendectomy    . Prostatectomy      BPH cause?    No family history on file.  Medications- reviewed and updated Current Outpatient Prescriptions  Medication Sig Dispense Refill  . amLODipine (NORVASC) 10 MG tablet TAKE 1 TABLET DAILY 90 tablet 0  . aspirin 81 MG tablet Take 81 mg by mouth daily.      Marland Kitchen atorvastatin (LIPITOR) 10 MG tablet TAKE 1 TABLET DAILY 90 tablet 2  . donepezil (ARICEPT) 10 MG tablet TAKE 1 TABLET AT BEDTIME 90 tablet 2  . glimepiride (AMARYL) 2 MG tablet TAKE 1 TABLET DAILY BEFORE BREAKFAST 90 tablet 3  . levothyroxine (SYNTHROID, LEVOTHROID) 75 MCG tablet Take 1 tablet (75 mcg total) by mouth daily before breakfast. 90 tablet 0  . memantine (NAMENDA) 5 MG tablet Take 1 tablet (5 mg total) by mouth 2 (two) times daily. 180 tablet 3   No current facility-administered medications for this visit.    Allergies-reviewed and updated No Known Allergies  History   Social History  . Marital Status: Married    Spouse Name: N/A    Number of Children: N/A  . Years of Education: N/A   Social History Main Topics  . Smoking status: Never Smoker   . Smokeless tobacco: Never Used  . Alcohol Use: No  . Drug Use: No  . Sexual Activity: None   Other Topics Concern  .  None   Social History Narrative   Married. 4 children. 4 grandkids-one died at 16. 1 greatgrandchild.    Lives with wife.       Retired from [employer]    ROS--See HPI   Objective: BP 130/64 mmHg  Pulse 74  Temp(Src) 97.5 F (36.4 C)  Wt 219 lb (99.338 kg) Gen: NAD, resting comfortably HEENT: Mucous membranes are moist. Neck: no thyromegaly CV: irregularly irregular, no murmurs rubs or gallops Lungs: CTAB no crackles, wheeze,  rhonchi Abdomen: soft/nontender/nondistended/normal bowel sounds.  Ext: 1+ edema Skin: warm, dry, no rash Neuro: grossly normal, moves all extremities, PERRLA   Assessment/Plan:  Atrial fibrillation Irregularly irregular on exam today. I advised EKG and discussion about benefits/risks of anticoagulation if in atrial fibrillation and other potential workup. Patient/family decline today as well as bloodwork as want to leave immediately with rain and getting dark.   Essential hypertension Well controlled on amlodipine 10mg . continue  Diabetes mellitus type II, controlled Controlled previously on low dose amaryl. Planned for a1c today but family/patient decline and will get bloodwork at next visit. Continue amaryl. I would also consider reduced dose and a1c goal closer to 7.5 with age and alzheimers.    Labs at next visit- direct ldl or lipids, a1c, cmet, cbc  Meds ordered this encounter  Medications  . memantine (NAMENDA) 5 MG tablet    Sig: Take 1 tablet (5 mg total) by mouth 2 (two) times daily.    Dispense:  180 tablet    Refill:  3

## 2014-06-10 NOTE — Assessment & Plan Note (Signed)
Well controlled on amlodipine 10mg . continue

## 2014-06-10 NOTE — Assessment & Plan Note (Signed)
Controlled previously on low dose amaryl. Planned for a1c today but family/patient decline and will get bloodwork at next visit. Continue amaryl. I would also consider reduced dose and a1c goal closer to 7.5 with age and alzheimers.

## 2014-06-10 NOTE — Assessment & Plan Note (Signed)
Irregularly irregular on exam today. I advised EKG and discussion about benefits/risks of anticoagulation if in atrial fibrillation and other potential workup. Patient/family decline today as well as bloodwork as want to leave immediately with rain and getting dark.

## 2014-06-16 ENCOUNTER — Telehealth: Payer: Self-pay | Admitting: Family Medicine

## 2014-06-16 NOTE — Telephone Encounter (Signed)
Pt advised to come back for ekg (pt could not stay the day they were here)  Do you need 30 min appt? If so pls advise,

## 2014-06-16 NOTE — Telephone Encounter (Signed)
73min will be fine

## 2014-06-17 NOTE — Telephone Encounter (Signed)
Lm on vm for wife to cb and sch appt

## 2014-06-18 NOTE — Telephone Encounter (Signed)
appt sched

## 2014-06-21 ENCOUNTER — Other Ambulatory Visit: Payer: Self-pay | Admitting: Family Medicine

## 2014-07-01 ENCOUNTER — Encounter: Payer: Self-pay | Admitting: Family Medicine

## 2014-07-01 ENCOUNTER — Ambulatory Visit (INDEPENDENT_AMBULATORY_CARE_PROVIDER_SITE_OTHER): Payer: Medicare Other | Admitting: Family Medicine

## 2014-07-01 VITALS — BP 140/60 | HR 73 | Temp 97.4°F | Wt 219.0 lb

## 2014-07-01 DIAGNOSIS — E119 Type 2 diabetes mellitus without complications: Secondary | ICD-10-CM

## 2014-07-01 DIAGNOSIS — I4891 Unspecified atrial fibrillation: Secondary | ICD-10-CM

## 2014-07-01 LAB — CBC WITH DIFFERENTIAL/PLATELET
BASOS ABS: 0.1 10*3/uL (ref 0.0–0.1)
Basophils Relative: 0.8 % (ref 0.0–3.0)
EOS PCT: 0.9 % (ref 0.0–5.0)
Eosinophils Absolute: 0.1 10*3/uL (ref 0.0–0.7)
HEMATOCRIT: 47.2 % (ref 39.0–52.0)
Hemoglobin: 15.6 g/dL (ref 13.0–17.0)
LYMPHS ABS: 1.5 10*3/uL (ref 0.7–4.0)
Lymphocytes Relative: 23.5 % (ref 12.0–46.0)
MCHC: 33.1 g/dL (ref 30.0–36.0)
MCV: 95.3 fl (ref 78.0–100.0)
Monocytes Absolute: 0.4 10*3/uL (ref 0.1–1.0)
Monocytes Relative: 6.3 % (ref 3.0–12.0)
Neutro Abs: 4.4 10*3/uL (ref 1.4–7.7)
Neutrophils Relative %: 68.5 % (ref 43.0–77.0)
PLATELETS: 153 10*3/uL (ref 150.0–400.0)
RBC: 4.95 Mil/uL (ref 4.22–5.81)
RDW: 13.8 % (ref 11.5–15.5)
WBC: 6.4 10*3/uL (ref 4.0–10.5)

## 2014-07-01 LAB — COMPREHENSIVE METABOLIC PANEL
ALBUMIN: 4.2 g/dL (ref 3.5–5.2)
ALT: 11 U/L (ref 0–53)
AST: 18 U/L (ref 0–37)
Alkaline Phosphatase: 90 U/L (ref 39–117)
BUN: 21 mg/dL (ref 6–23)
CALCIUM: 9.5 mg/dL (ref 8.4–10.5)
CHLORIDE: 106 meq/L (ref 96–112)
CO2: 30 mEq/L (ref 19–32)
Creatinine, Ser: 1.76 mg/dL — ABNORMAL HIGH (ref 0.40–1.50)
GFR: 39.28 mL/min — ABNORMAL LOW (ref >60.00)
Glucose, Bld: 190 mg/dL — ABNORMAL HIGH (ref 70–99)
POTASSIUM: 4.3 meq/L (ref 3.5–5.1)
SODIUM: 143 meq/L (ref 135–145)
Total Bilirubin: 1 mg/dL (ref 0.2–1.2)
Total Protein: 6.8 g/dL (ref 6.0–8.3)

## 2014-07-01 LAB — LIPID PANEL
CHOL/HDL RATIO: 4
Cholesterol: 205 mg/dL — ABNORMAL HIGH (ref 0–200)
HDL: 56 mg/dL (ref >39.00)
LDL Cholesterol: 116 mg/dL — ABNORMAL HIGH (ref 0–99)
NonHDL: 149
Triglycerides: 164 mg/dL — ABNORMAL HIGH (ref 0.0–149.0)
VLDL: 32.8 mg/dL (ref 0.0–40.0)

## 2014-07-01 LAB — HEMOGLOBIN A1C: Hgb A1c MFr Bld: 7.3 % — ABNORMAL HIGH (ref 4.6–6.5)

## 2014-07-01 NOTE — Progress Notes (Signed)
Robert Reddish, MD Phone: [phone]  Subjective:   Robert Salinas. is a 81 y.o. year old very pleasant male patient who presents with the following:  Atrial fibrillation- no reported recurrence since 2010 At last visit heard an irregular heart beat and recommended EKG but familydid not have time. Returns today for repeat evaluation. Patient denies chest pain, palpitations. Patient compliant with aspirin ROS- no shortness of breath, fatigue  Diabetes Mellitus- controlled Has been well controlled on amaryl 2mg . Patient does not check CBGs. Admits to be very inactive in winter. Eating poorly over holidays ROS- denies hypoglycemia or blurry vision  Past Medical History- Patient Active Problem List   Diagnosis Date Noted  . DNR (do not resuscitate) 12/08/2013    Priority: High  . CKD (chronic kidney disease), stage III 02/21/2011    Priority: High  . Diabetes mellitus type II, controlled 06/09/2009    Priority: High  . ALZHEIMER'S DISEASE, EARLY 06/09/2009    Priority: High  . Atrial fibrillation 06/09/2009    Priority: High  . Hypothyroid 10/09/2011    Priority: Medium  . Hyperlipidemia 06/09/2009    Priority: Medium  . Essential hypertension 06/09/2009    Priority: Medium   Medications- reviewed and updated Current Outpatient Prescriptions  Medication Sig Dispense Refill  . amLODipine (NORVASC) 10 MG tablet TAKE 1 TABLET DAILY 90 tablet 0  . aspirin 81 MG tablet Take 81 mg by mouth daily.      Marland Kitchen atorvastatin (LIPITOR) 10 MG tablet TAKE 1 TABLET DAILY 90 tablet 2  . donepezil (ARICEPT) 10 MG tablet TAKE 1 TABLET AT BEDTIME 90 tablet 2  . glimepiride (AMARYL) 2 MG tablet TAKE 1 TABLET DAILY BEFORE BREAKFAST 90 tablet 3  . levothyroxine (SYNTHROID, LEVOTHROID) 75 MCG tablet TAKE 1 TABLET DAILY BEFORE BREAKFAST 90 tablet 1  . memantine (NAMENDA) 5 MG tablet Take 1 tablet (5 mg total) by mouth 2 (two) times daily. 180 tablet 3   Objective: BP 140/60 mmHg  Pulse 73   Temp(Src) 97.4 F (36.3 C)  Wt 219 lb (99.338 kg) Gen: NAD, resting comfortably CV: RRR no murmurs rubs or gallops Lungs: CTAB no crackles, wheeze, rhonchi Abdomen: soft/nontender/nondistended/normal bowel sounds. No rebound or guarding.  Ext: no edema Skin: warm, dry, raised slightly erythematous lesion on right hand about 1-1.5cm.  Neuro: grossly normal, moves all extremities  EKG: R 68, 1st degree AV block with PVCs noted, normal axis, otherwise normal intervals, no hypertrophy, some flattened t waves   Assessment/Plan:  Atrial fibrillation EKG shows NSR with PVCs (done for suspicion of paroxysmal atrial fibrillation). IF recurrent, would consider anticoagulant and not just ASA. Continue aspirin alone at present.    Diabetes mellitus type II, controlled Lab Results  Component Value Date   HGBA1C 7.3* 07/01/2014  a1c trending up. Given dementia, age goal would be 10 so technically still at goal. Advised increased physical activity and repeat 3-4 months. Continue amaryl alone.    Return precautions advised. Folllow up 3-4 months. Patient to return for shave biopsy of lesion on right hand within 1-2 weeks. 30 minute visit needed.   Fasting Results for orders placed or performed in visit on 07/01/14 (from the past 24 hour(s))  Hemoglobin A1c     Status: Abnormal   Collection Time: 07/01/14 10:10 AM  Result Value Ref Range   Hgb A1c MFr Bld 7.3 (H) 4.6 - 6.5 %  Comprehensive metabolic panel     Status: Abnormal   Collection Time: 07/01/14 10:10 AM  Result Value Ref Range   Sodium 143 135 - 145 mEq/L   Potassium 4.3 3.5 - 5.1 mEq/L   Chloride 106 96 - 112 mEq/L   CO2 30 19 - 32 mEq/L   Glucose, Bld 190 (H) 70 - 99 mg/dL   BUN 21 6 - 23 mg/dL   Creatinine, Ser 1.76 (H) 0.40 - 1.50 mg/dL   Total Bilirubin 1.0 0.2 - 1.2 mg/dL   Alkaline Phosphatase 90 39 - 117 U/L   AST 18 0 - 37 U/L   ALT 11 0 - 53 U/L   Total Protein 6.8 6.0 - 8.3 g/dL   Albumin 4.2 3.5 - 5.2 g/dL    Calcium 9.5 8.4 - 10.5 mg/dL   GFR 39.28 (L) >60.00 mL/min  CBC with Differential     Status: None   Collection Time: 07/01/14 10:10 AM  Result Value Ref Range   WBC 6.4 4.0 - 10.5 K/uL   RBC 4.95 4.22 - 5.81 Mil/uL   Hemoglobin 15.6 13.0 - 17.0 g/dL   HCT 47.2 39.0 - 52.0 %   MCV 95.3 78.0 - 100.0 fl   MCHC 33.1 30.0 - 36.0 g/dL   RDW 13.8 11.5 - 15.5 %   Platelets 153.0 150.0 - 400.0 K/uL   Neutrophils Relative % 68.5 43.0 - 77.0 %   Lymphocytes Relative 23.5 12.0 - 46.0 %   Monocytes Relative 6.3 3.0 - 12.0 %   Eosinophils Relative 0.9 0.0 - 5.0 %   Basophils Relative 0.8 0.0 - 3.0 %   Neutro Abs 4.4 1.4 - 7.7 K/uL   Lymphs Abs 1.5 0.7 - 4.0 K/uL   Monocytes Absolute 0.4 0.1 - 1.0 K/uL   Eosinophils Absolute 0.1 0.0 - 0.7 K/uL   Basophils Absolute 0.1 0.0 - 0.1 K/uL  Lipid panel     Status: Abnormal   Collection Time: 07/01/14 10:10 AM  Result Value Ref Range   Cholesterol 205 (H) 0 - 200 mg/dL   Triglycerides 164.0 (H) 0.0 - 149.0 mg/dL   HDL 56.00 >39.00 mg/dL   VLDL 32.8 0.0 - 40.0 mg/dL   LDL Cholesterol 116 (H) 0 - 99 mg/dL   Total CHOL/HDL Ratio 4    NonHDL 149.00   cholesterol also up slightly-advised increased activity and regular compliance with statin. Previously well controlled

## 2014-07-01 NOTE — Patient Instructions (Addendum)
Not in atrial fibrillation but do have some extra beats. No changes to medicine.   1-1.5 cm lesion on right hand concerning for cancer. You opted to have [place] shave it before sending to dermatology which we would if it was cancer.   See [place] back within the next few weeks for a 30 minute procedure visit to remove this.

## 2014-07-01 NOTE — Assessment & Plan Note (Signed)
EKG shows NSR with PVCs (done for suspicion of paroxysmal atrial fibrillation). IF recurrent, would consider anticoagulant and not just ASA. Continue aspirin alone at present.

## 2014-07-01 NOTE — Assessment & Plan Note (Signed)
Lab Results  Component Value Date   HGBA1C 7.3* 07/01/2014  a1c trending up. Given dementia, age goal would be 10 so technically still at goal. Advised increased physical activity and repeat 3-4 months. Continue amaryl alone.

## 2014-07-07 ENCOUNTER — Telehealth: Payer: Self-pay | Admitting: Family Medicine

## 2014-07-07 NOTE — Telephone Encounter (Signed)
Pt was seen on 07-01-14 and waiting at pharm for dm med to be sent

## 2014-07-08 MED ORDER — GLIMEPIRIDE 2 MG PO TABS: 2.0000 mg | ORAL_TABLET | Freq: Every day | ORAL | Status: DC

## 2014-07-08 NOTE — Telephone Encounter (Signed)
Medication sent to [facility] for refill

## 2014-08-02 ENCOUNTER — Other Ambulatory Visit: Payer: Self-pay | Admitting: Internal Medicine

## 2014-09-18 ENCOUNTER — Other Ambulatory Visit: Payer: Self-pay | Admitting: Internal Medicine

## 2014-12-09 ENCOUNTER — Telehealth: Payer: Self-pay | Admitting: Family Medicine

## 2014-12-09 MED ORDER — GLIMEPIRIDE 2 MG PO TABS: 2.0000 mg | ORAL_TABLET | Freq: Every day | ORAL | Status: DC

## 2014-12-09 NOTE — Telephone Encounter (Signed)
Pt need a 7 day supply of the following medicine glimepiride (AMARYL) 2 MG tablet  Until he received his rx from his mail order .      Pharmacy  [facility]  [place]

## 2014-12-09 NOTE — Telephone Encounter (Signed)
Medication refilled

## 2014-12-18 ENCOUNTER — Other Ambulatory Visit: Payer: Self-pay | Admitting: Family Medicine

## 2015-02-09 ENCOUNTER — Ambulatory Visit (INDEPENDENT_AMBULATORY_CARE_PROVIDER_SITE_OTHER): Payer: Medicare Other | Admitting: Adult Health

## 2015-02-09 ENCOUNTER — Encounter: Payer: Self-pay | Admitting: Adult Health

## 2015-02-09 VITALS — BP 124/64 | Temp 97.7°F | Ht 70.5 in | Wt 214.2 lb

## 2015-02-09 DIAGNOSIS — G629 Polyneuropathy, unspecified: Secondary | ICD-10-CM

## 2015-02-09 MED ORDER — GABAPENTIN 100 MG PO CAPS: 100.0000 mg | ORAL_CAPSULE | Freq: Three times a day (TID) | ORAL | Status: DC

## 2015-02-09 NOTE — Progress Notes (Signed)
Pre visit review using our clinic review tool, if applicable. No additional management support is needed unless otherwise documented below in the visit note. 

## 2015-02-09 NOTE — Progress Notes (Signed)
Subjective:    Patient ID: Robert Colonel., male    DOB: [birth year], 81 y.o.   MRN: [id number]  HPI  Patient was seen by a NP during a House Call that was set up by their insurance company . During the exam it was noticed that the patient had pain in both of his legs. His A1c during this exam was 6.7. Per note on paperwork from the house call " Try low dose Gabapentin for leg and feet pain".   He endorses having a history of neuropathy and has not tried anything in the past for the pain.   Review of Systems  Constitutional: Negative.   Respiratory: Negative.   Cardiovascular: Positive for leg swelling.  Gastrointestinal: Negative.   Endocrine: Negative.   Genitourinary: Negative.   Musculoskeletal: Positive for gait problem.  Neurological: Negative for weakness, numbness and headaches.       Neuropathic pain in lower extremities  All other systems reviewed and are negative.  Past Medical History  Diagnosis Date  . Chickenpox   . Hx of colonic polyps   . Diabetes mellitus   . Hyperlipidemia   . Hypertension   . Atrial fibrillation   . Hypothyroidism   . Thrombocytopenia   . Dementia   . BPH (benign prostatic hyperplasia)   . COLONIC POLYPS, HX OF 06/09/2009    Social History   Social History  . Marital Status: Married    Spouse Name: N/A  . Number of Children: N/A  . Years of Education: N/A   Occupational History  . Not on file.   Social History Main Topics  . Smoking status: Never Smoker   . Smokeless tobacco: Never Used  . Alcohol Use: No  . Drug Use: No  . Sexual Activity: Not on file   Other Topics Concern  . Not on file   Social History Narrative   Married. 4 children. 4 grandkids-one died at 14. 1 greatgrandchild.    Lives with wife.       Retired from [employer]    Past Surgical History  Procedure Laterality Date  . Appendectomy    . Prostatectomy      BPH cause?    No family history on file.  No Known Allergies  Current Outpatient  Prescriptions on File Prior to Visit  Medication Sig Dispense Refill  . amLODipine (NORVASC) 10 MG tablet TAKE 1 TABLET DAILY 90 tablet 3  . aspirin 81 MG tablet Take 81 mg by mouth daily.      Marland Kitchen atorvastatin (LIPITOR) 10 MG tablet TAKE 1 TABLET DAILY 90 tablet 2  . donepezil (ARICEPT) 10 MG tablet TAKE 1 TABLET AT BEDTIME 90 tablet 1  . glimepiride (AMARYL) 2 MG tablet Take 1 tablet (2 mg total) by mouth daily before breakfast. 7 tablet 0  . levothyroxine (SYNTHROID, LEVOTHROID) 75 MCG tablet TAKE 1 TABLET DAILY BEFORE BREAKFAST 90 tablet 1  . memantine (NAMENDA) 5 MG tablet Take 1 tablet (5 mg total) by mouth 2 (two) times daily. 180 tablet 3   No current facility-administered medications on file prior to visit.    BP 124/64 mmHg  Temp(Src) 97.7 F (36.5 C) (Oral)  Ht 5' 10.5" (1.791 m)  Wt 214 lb 3.2 oz (97.16 kg)  BMI 30.29 kg/m2       Objective:   Physical Exam  Constitutional: He is oriented to person, place, and time. He appears well-developed and well-nourished. No distress.  Cardiovascular: Normal rate, regular rhythm, normal heart  sounds and intact distal pulses.  Exam reveals no gallop and no friction rub.   No murmur heard. Pulmonary/Chest: Effort normal and breath sounds normal.  Musculoskeletal: Normal range of motion. He exhibits edema (+1 pitting edema in lower extremities). He exhibits no tenderness.  Walks with cane  Neurological: He is alert and oriented to person, place, and time. He exhibits normal muscle tone. Coordination normal.  Skin: Skin is warm and dry. No rash noted. He is not diaphoretic. No erythema. No pallor.  Psychiatric: He has a normal mood and affect. His behavior is normal. Thought content normal.  Nursing note and vitals reviewed.     Assessment & Plan:  1. Neuropathy - I have no issue with trialing gabapentin (NEURONTIN) 100 MG capsule; Take 1 capsule (100 mg total) by mouth 3 (three) times daily.  Dispense: 90 capsule; Refill: 0 -  Follow up with Dr. Yong Channel for further management of this issue.

## 2015-02-09 NOTE — Patient Instructions (Addendum)
It was great meeting you today!  I have sent in a prescription for Neurontin, please take this three times a day.   Follow up with Dr. Yong Channel

## 2015-02-14 ENCOUNTER — Other Ambulatory Visit: Payer: Self-pay | Admitting: Internal Medicine

## 2015-03-13 ENCOUNTER — Other Ambulatory Visit: Payer: Self-pay | Admitting: Adult Health

## 2015-03-15 ENCOUNTER — Other Ambulatory Visit: Payer: Self-pay | Admitting: Family Medicine

## 2015-03-17 NOTE — Telephone Encounter (Signed)
Robert Salinas, it looks like Tommi Rumps started pt on this medication one day with Dr. Yong Channel was out of the office. Please have Dr. Yong Channel review if needed and send to the pharmacy.

## 2015-03-17 NOTE — Telephone Encounter (Signed)
Yes thanks, may refill if working for patient- assume it is since he is requesting. Happy to see him for titration if needed

## 2015-03-17 NOTE — Telephone Encounter (Signed)
See note below

## 2015-05-15 ENCOUNTER — Emergency Department (HOSPITAL_COMMUNITY): Payer: Medicare Other

## 2015-05-15 ENCOUNTER — Encounter (HOSPITAL_COMMUNITY): Payer: Self-pay | Admitting: Emergency Medicine

## 2015-05-15 ENCOUNTER — Emergency Department (HOSPITAL_COMMUNITY)
Admission: EM | Admit: 2015-05-15 | Discharge: 2015-05-15 | Disposition: A | Discharge status: Home / Self Care | Payer: Medicare Other | Attending: Emergency Medicine | Admitting: Emergency Medicine

## 2015-05-15 DIAGNOSIS — I129 Hypertensive chronic kidney disease with stage 1 through stage 4 chronic kidney disease, or unspecified chronic kidney disease: Secondary | ICD-10-CM | POA: Diagnosis not present

## 2015-05-15 DIAGNOSIS — S79912A Unspecified injury of left hip, initial encounter: Secondary | ICD-10-CM | POA: Insufficient documentation

## 2015-05-15 DIAGNOSIS — M25559 Pain in unspecified hip: Secondary | ICD-10-CM

## 2015-05-15 DIAGNOSIS — E119 Type 2 diabetes mellitus without complications: Secondary | ICD-10-CM | POA: Diagnosis not present

## 2015-05-15 DIAGNOSIS — Z79899 Other long term (current) drug therapy: Secondary | ICD-10-CM | POA: Insufficient documentation

## 2015-05-15 DIAGNOSIS — N189 Chronic kidney disease, unspecified: Secondary | ICD-10-CM | POA: Diagnosis not present

## 2015-05-15 DIAGNOSIS — E039 Hypothyroidism, unspecified: Secondary | ICD-10-CM | POA: Insufficient documentation

## 2015-05-15 DIAGNOSIS — I4891 Unspecified atrial fibrillation: Secondary | ICD-10-CM | POA: Diagnosis not present

## 2015-05-15 DIAGNOSIS — Z7982 Long term (current) use of aspirin: Secondary | ICD-10-CM | POA: Diagnosis not present

## 2015-05-15 DIAGNOSIS — Z87438 Personal history of other diseases of male genital organs: Secondary | ICD-10-CM | POA: Diagnosis not present

## 2015-05-15 DIAGNOSIS — S79911A Unspecified injury of right hip, initial encounter: Secondary | ICD-10-CM | POA: Insufficient documentation

## 2015-05-15 DIAGNOSIS — G309 Alzheimer's disease, unspecified: Secondary | ICD-10-CM | POA: Diagnosis not present

## 2015-05-15 DIAGNOSIS — W1839XA Other fall on same level, initial encounter: Secondary | ICD-10-CM | POA: Diagnosis not present

## 2015-05-15 DIAGNOSIS — Y9289 Other specified places as the place of occurrence of the external cause: Secondary | ICD-10-CM | POA: Insufficient documentation

## 2015-05-15 DIAGNOSIS — Y9389 Activity, other specified: Secondary | ICD-10-CM | POA: Insufficient documentation

## 2015-05-15 DIAGNOSIS — Z86010 Personal history of colonic polyps: Secondary | ICD-10-CM | POA: Insufficient documentation

## 2015-05-15 DIAGNOSIS — E785 Hyperlipidemia, unspecified: Secondary | ICD-10-CM | POA: Insufficient documentation

## 2015-05-15 DIAGNOSIS — Y998 Other external cause status: Secondary | ICD-10-CM | POA: Insufficient documentation

## 2015-05-15 DIAGNOSIS — N390 Urinary tract infection, site not specified: Secondary | ICD-10-CM | POA: Insufficient documentation

## 2015-05-15 DIAGNOSIS — R109 Unspecified abdominal pain: Secondary | ICD-10-CM

## 2015-05-15 DIAGNOSIS — F0280 Dementia in other diseases classified elsewhere without behavioral disturbance: Secondary | ICD-10-CM | POA: Diagnosis not present

## 2015-05-15 LAB — CBC WITH DIFFERENTIAL/PLATELET
BASOS ABS: 0 10*3/uL (ref 0.0–0.1)
BASOS PCT: 0 %
EOS ABS: 0 10*3/uL (ref 0.0–0.7)
EOS PCT: 1 %
HCT: 41.3 % (ref 39.0–52.0)
HEMOGLOBIN: 13.7 g/dL (ref 13.0–17.0)
LYMPHS ABS: 1.2 10*3/uL (ref 0.7–4.0)
Lymphocytes Relative: 17 %
MCH: 30.9 pg (ref 26.0–34.0)
MCHC: 33.2 g/dL (ref 30.0–36.0)
MCV: 93.2 fL (ref 78.0–100.0)
Monocytes Absolute: 0.5 10*3/uL (ref 0.1–1.0)
Monocytes Relative: 7 %
NEUTROS PCT: 75 %
Neutro Abs: 5.2 10*3/uL (ref 1.7–7.7)
PLATELETS: 190 10*3/uL (ref 150–400)
RBC: 4.43 MIL/uL (ref 4.22–5.81)
RDW: 13.1 % (ref 11.5–15.5)
WBC: 7 10*3/uL (ref 4.0–10.5)

## 2015-05-15 LAB — COMPREHENSIVE METABOLIC PANEL
ALBUMIN: 3.5 g/dL (ref 3.5–5.0)
ALT: 17 U/L (ref 17–63)
AST: 22 U/L (ref 15–41)
Alkaline Phosphatase: 107 U/L (ref 38–126)
Anion gap: 8 (ref 5–15)
BUN: 25 mg/dL — ABNORMAL HIGH (ref 6–20)
CHLORIDE: 101 mmol/L (ref 101–111)
CO2: 26 mmol/L (ref 22–32)
CREATININE: 1.98 mg/dL — AB (ref 0.61–1.24)
Calcium: 8.8 mg/dL — ABNORMAL LOW (ref 8.9–10.3)
GFR calc non Af Amer: 29 mL/min — ABNORMAL LOW (ref >60)
GFR, EST AFRICAN AMERICAN: 33 mL/min — AB (ref >60)
GLUCOSE: 339 mg/dL — AB (ref 65–99)
Potassium: 4.3 mmol/L (ref 3.5–5.1)
SODIUM: 135 mmol/L (ref 135–145)
Total Bilirubin: 0.9 mg/dL (ref 0.3–1.2)
Total Protein: 6.2 g/dL — ABNORMAL LOW (ref 6.5–8.1)

## 2015-05-15 LAB — URINALYSIS, ROUTINE W REFLEX MICROSCOPIC
Bilirubin Urine: NEGATIVE
Glucose, UA: 500 mg/dL — AB
KETONES UR: NEGATIVE mg/dL
NITRITE: NEGATIVE
PH: 5.5 (ref 5.0–8.0)
PROTEIN: 30 mg/dL — AB
SPECIFIC GRAVITY, URINE: 1.014 (ref 1.005–1.030)

## 2015-05-15 LAB — URINE MICROSCOPIC-ADD ON
BACTERIA UA: NONE SEEN
SQUAMOUS EPITHELIAL / LPF: NONE SEEN

## 2015-05-15 LAB — I-STAT CG4 LACTIC ACID, ED: Lactic Acid, Venous: 1.46 mmol/L (ref 0.5–2.0)

## 2015-05-15 MED ORDER — DEXTROSE 5 % IV SOLN
1.0000 g | Freq: Once | INTRAVENOUS | Status: AC
  Administered 2015-05-15: 1 g via INTRAVENOUS
  Filled 2015-05-15: qty 10

## 2015-05-15 MED ORDER — CEPHALEXIN 500 MG PO CAPS: 500.0000 mg | ORAL_CAPSULE | Freq: Four times a day (QID) | ORAL | Status: DC

## 2015-05-15 MED ORDER — SODIUM CHLORIDE 0.9 % IV BOLUS (SEPSIS)
500.0000 mL | Freq: Once | INTRAVENOUS | Status: AC
  Administered 2015-05-15: 500 mL via INTRAVENOUS

## 2015-05-15 NOTE — ED Notes (Signed)
MD at bedside. 

## 2015-05-15 NOTE — ED Notes (Addendum)
Pt has bilateral hip pain. Has been to UC recently and given antibiotics; son states he may not have been taking meds as prescribed. Ambulated from wheelchair to bed with assistance. Pt and son poor historian.

## 2015-05-15 NOTE — ED Notes (Signed)
Patient transported to X-ray and then CT 

## 2015-05-15 NOTE — ED Notes (Signed)
Urine sample obtained; patient back in bed with yellow sock and armband; call light within reach; no needs at this time; phleb at bedside.

## 2015-05-15 NOTE — Care Management Note (Signed)
Case Management Note  Patient Details  Name: Robert Salinas. MRN: [id number] Date of Birth: [birth year]  Subjective/Objective:   81 y.o. M seen in the ED for periods of confusion, the family thinks it may be related to medications, which he and his 65 y.o.wife are having a hard time administering as ordered. Requesting an RN come to the home to assist with medications and other safety issues. Son lives close but is in the process of disability qualification.  Will arrange [facility] for now.  Pt and son also given list of Self pay [place].                Action/Plan: Discharge with [facility] per [facility]. No further CM needs at present.    Expected Discharge Date:                  Expected Discharge Plan:  [place]  In-House Referral:     Discharge planning Services  CM Consult  Post Acute Care Choice:    Choice offered to:  Patient, Adult Children  DME Arranged:    DME Agency:     HH Arranged:  RN [place] Agency:  [place]  Status of Service:  Completed, signed off  Medicare Important Message Given:    Date Medicare IM Given:    Medicare IM give by:    Date Additional Medicare IM Given:    Additional Medicare Important Message give by:     If discussed at [place] of Stay Meetings, dates discussed:    Additional Comments:  Delrae Sawyers, RN 05/15/2015, 1:04 PM

## 2015-05-15 NOTE — ED Notes (Signed)
Care management at bedside at this time.

## 2015-05-15 NOTE — ED Notes (Signed)
Patient has large blister on left posterior heel; not open or weeping.

## 2015-05-15 NOTE — ED Notes (Signed)
Patient transported to CT 

## 2015-05-15 NOTE — ED Notes (Signed)
Patient reports he is having frequent bedwetting. Patient's wife has taken him off aspirin due to concerns that it is causing bedwetting.

## 2015-05-15 NOTE — ED Provider Notes (Signed)
CSN: [id number]     Arrival date & time 05/15/15  1059 History   First MD Initiated Contact with Patient 05/15/15 1107     Chief Complaint  Patient presents with  . Hip Pain  . Fall     (Consider location/radiation/quality/duration/timing/severity/associated sxs/prior Treatment) HPI Comments: 81 y.o. Male with history of DM, early alzheimer's disease, CKD, hyperlipidemia presents for bilateral hip pain following a fall.  The patient lives at home with his wife and is still very active and cuts his own grass.  A few days ago the patient fell and had difficulty getting up off of the ground but has been walking since and walked from the parking lot back to his room in the ER without difficulty.  He also recently has had some urinary incontinence and says he has a history of not always completely emptying his bladder but reports he feels like he can urinate fine when he does go to the bathroom.  His son reports that the patient's wife has been changing the patient's medication regimen at home without talking to the doctor and he is concerned there is difficulty in managing medications at home.  Patient reports soreness in the bilateral hips but denies difficulty ambulating.    Patient is a 81 y.o. male presenting with hip pain and fall.  Hip Pain Pertinent negatives include no chest pain, no abdominal pain, no headaches and no shortness of breath.  Fall Pertinent negatives include no chest pain, no abdominal pain, no headaches and no shortness of breath.    Past Medical History  Diagnosis Date  . Chickenpox   . Hx of colonic polyps   . Diabetes mellitus   . Hyperlipidemia   . Hypertension   . Atrial fibrillation ([place])   . Hypothyroidism   . Thrombocytopenia ([place])   . Dementia   . BPH (benign prostatic hyperplasia)   . COLONIC POLYPS, HX OF 06/09/2009   Past Surgical History  Procedure Laterality Date  . Appendectomy    . Prostatectomy      BPH cause?   History reviewed. No  pertinent family history. Social History  Substance Use Topics  . Smoking status: Never Smoker   . Smokeless tobacco: Never Used  . Alcohol Use: No    Review of Systems  Constitutional: Negative for fever, chills, diaphoresis, appetite change and fatigue.  HENT: Negative for congestion and postnasal drip.   Respiratory: Negative for cough, chest tightness, shortness of breath and wheezing.   Cardiovascular: Negative for chest pain and palpitations.  Gastrointestinal: Negative for nausea, vomiting, abdominal pain and diarrhea.  Genitourinary: Negative for dysuria, hematuria, flank pain, discharge and penile pain.  Musculoskeletal: Positive for arthralgias. Negative for back pain and gait problem.  Neurological: Negative for dizziness, weakness, light-headedness and headaches.      Allergies  Review of patient's allergies indicates no known allergies.  Home Medications   Prior to Admission medications   Medication Sig Start Date End Date Taking? Authorizing Provider  amLODipine (NORVASC) 10 MG tablet TAKE 1 TABLET DAILY 08/03/14  Yes [name], MD  atorvastatin (LIPITOR) 10 MG tablet TAKE 1 TABLET DAILY 02/15/15  Yes [name], MD  donepezil (ARICEPT) 10 MG tablet TAKE 1 TABLET AT BEDTIME 03/16/15  Yes [name], MD  gabapentin (NEURONTIN) 100 MG capsule TAKE ONE CAPSULE BY MOUTH 3 TIMES A DAY 03/17/15  Yes [name], MD  glimepiride (AMARYL) 2 MG tablet Take 1 tablet (2 mg total) by mouth daily  before breakfast. 12/09/14  Yes [name], MD  levothyroxine (SYNTHROID, LEVOTHROID) 75 MCG tablet TAKE 1 TABLET DAILY BEFORE BREAKFAST 12/18/14  Yes [name], MD  memantine (NAMENDA) 5 MG tablet Take 1 tablet (5 mg total) by mouth 2 (two) times daily. 06/10/14  Yes [name], MD  aspirin 81 MG tablet Take 81 mg by mouth daily.      Historical Provider, MD   BP 132/57 mmHg  Pulse 69  Temp(Src) 97.6 F (36.4 C) (Oral)  Resp 11  SpO2  98% Physical Exam  Constitutional: He is oriented to person, place, and time. He appears well-developed and well-nourished. No distress.  HENT:  Head: Normocephalic and atraumatic.  Right Ear: External ear normal.  Left Ear: External ear normal.  Mouth/Throat: Oropharynx is clear and moist. No oropharyngeal exudate.  Eyes: EOM are normal. Pupils are equal, round, and reactive to light.  Neck: Normal range of motion. Neck supple.  Cardiovascular: Normal rate, regular rhythm, normal heart sounds and intact distal pulses.   No murmur heard. Pulmonary/Chest: Effort normal. No respiratory distress. He has no wheezes. He has no rales.  Abdominal: Soft. He exhibits no distension. There is no tenderness.  Musculoskeletal: Normal range of motion. He exhibits no edema or tenderness.  Patient able to completely lift legs off bed and bend knees without any pain or difficulty  Neurological: He is alert and oriented to person, place, and time. He has normal strength. No cranial nerve deficit or sensory deficit. He exhibits normal muscle tone. Coordination normal.  Skin: Skin is warm and dry. No rash noted. He is not diaphoretic.  Vitals reviewed.   ED Course  Procedures (including critical care time) Labs Review Labs Reviewed  COMPREHENSIVE METABOLIC PANEL - Abnormal; Notable for the following:    Glucose, Bld 339 (*)    BUN 25 (*)    Creatinine, Ser 1.98 (*)    Calcium 8.8 (*)    Total Protein 6.2 (*)    GFR calc non Af Amer 29 (*)    GFR calc Af Amer 33 (*)    All other components within normal limits  URINALYSIS, ROUTINE W REFLEX MICROSCOPIC (NOT AT [facility]) - Abnormal; Notable for the following:    APPearance TURBID (*)    Glucose, UA 500 (*)    Hgb urine dipstick MODERATE (*)    Protein, ur 30 (*)    Leukocytes, UA LARGE (*)    All other components within normal limits  URINE MICROSCOPIC-ADD ON - Abnormal; Notable for the following:    Casts HYALINE CASTS (*)    All other components  within normal limits  CBC WITH DIFFERENTIAL/PLATELET  I-STAT CG4 LACTIC ACID, ED    Imaging Review Dg Hips Bilat With Pelvis 3-4 Views  05/15/2015  CLINICAL DATA:  Chronic bilateral hip pain several months. Recent history of falls. EXAM: DG HIP (WITH OR WITHOUT PELVIS) 3-4V BILAT COMPARISON:  None. FINDINGS: Exam demonstrates mild-to-moderate symmetric degenerative change of the hips. There is mild degenerate change of the symphysis pubis joint. No evidence of acute fracture or dislocation. Degenerative changes of the spine. IMPRESSION: No acute findings. Mild to moderate degenerative change of the hips. Electronically Signed   By: [name] M.D.   On: 05/15/2015 12:26   I have personally reviewed and evaluated these images and lab results as part of my medical decision-making.   EKG Interpretation   Date/Time:  Saturday May 15 2015 11:28:05 EST Ventricular Rate:  77 PR  Interval:  232 QRS Duration: 74 QT Interval:  409 QTC Calculation: 463 R Axis:   16 Text Interpretation:  Sinus rhythm Prolonged PR interval Low voltage,  precordial leads Baseline wander in lead(s) V4 PVCs No significant change  since last tracing Confirmed by Sheldon Sem ([id number]) on 05/15/2015  11:44:31 AM      MDM  Patient seen and evaluated in stable condition.  BEnign examination.  Patient said he would not want placement for further help with ADL but would want help at his home.  Labs unremarkable other than mild elevation in Cr for patient although patient with chronically elevated Cr and UA consistent with Infection.  Rocephin ordered.  No leukocytosis or lactic acidosis.  CT without obstruction although large prostate.  Care management set up for home health visit.  Discussed all results with patient and his son who expressed understanding and agreement with plan for discharge.  PAtient discharged home in stable condition with instruction to follow up with his PCP and urology and with strict return  precautions. Final diagnoses:  Hip pain  UTI (lower urinary tract infection)  Flank pain    UTI    Harvel Quale, MD 05/17/15 [phone]

## 2015-05-15 NOTE — ED Notes (Signed)
Pt has returned from CT; no needs.

## 2015-05-15 NOTE — Discharge Instructions (Signed)
You were seen today and found to have a urinary tract infection.  Take the antibiotics as prescribed and follow up with your primary care physician as well as urology.  Home health will come to your house to assess your home and see what help and services they can offer.  Return to the emergency room immediately with any sudden worsening symptoms or any other new concerns.  Urinary Tract Infection Urinary tract infections (UTIs) can develop anywhere along your urinary tract. Your urinary tract is your body's drainage system for removing wastes and extra water. Your urinary tract includes two kidneys, two ureters, a bladder, and a urethra. Your kidneys are a pair of bean-shaped organs. Each kidney is about the size of your fist. They are located below your ribs, one on each side of your spine. CAUSES Infections are caused by microbes, which are microscopic organisms, including fungi, viruses, and bacteria. These organisms are so small that they can only be seen through a microscope. Bacteria are the microbes that most commonly cause UTIs. SYMPTOMS  Symptoms of UTIs may vary by age and gender of the patient and by the location of the infection. Symptoms in young women typically include a frequent and intense urge to urinate and a painful, burning feeling in the bladder or urethra during urination. Older women and men are more likely to be tired, shaky, and weak and have muscle aches and abdominal pain. A fever may mean the infection is in your kidneys. Other symptoms of a kidney infection include pain in your back or sides below the ribs, nausea, and vomiting. DIAGNOSIS To diagnose a UTI, your caregiver will ask you about your symptoms. Your caregiver will also ask you to provide a urine sample. The urine sample will be tested for bacteria and white blood cells. White blood cells are made by your body to help fight infection. TREATMENT  Typically, UTIs can be treated with medication. Because most UTIs are  caused by a bacterial infection, they usually can be treated with the use of antibiotics. The choice of antibiotic and length of treatment depend on your symptoms and the type of bacteria causing your infection. HOME CARE INSTRUCTIONS  If you were prescribed antibiotics, take them exactly as your caregiver instructs you. Finish the medication even if you feel better after you have only taken some of the medication.  Drink enough water and fluids to keep your urine clear or pale yellow.  Avoid caffeine, tea, and carbonated beverages. They tend to irritate your bladder.  Empty your bladder often. Avoid holding urine for long periods of time.  Empty your bladder before and after sexual intercourse.  After a bowel movement, women should cleanse from front to back. Use each tissue only once. SEEK MEDICAL CARE IF:   You have back pain.  You develop a fever.  Your symptoms do not begin to resolve within 3 days. SEEK IMMEDIATE MEDICAL CARE IF:   You have severe back pain or lower abdominal pain.  You develop chills.  You have nausea or vomiting.  You have continued burning or discomfort with urination. MAKE SURE YOU:   Understand these instructions.  Will watch your condition.  Will get help right away if you are not doing well or get worse.   This information is not intended to replace advice given to you by your health care provider. Make sure you discuss any questions you have with your health care provider.   Document Released: 03/15/2005 Document Revised: 02/24/2015 Document Reviewed:  07/14/2011 Elsevier Interactive Patient Education [employer].

## 2015-05-17 ENCOUNTER — Telehealth: Payer: Self-pay | Admitting: Family Medicine

## 2015-05-17 ENCOUNTER — Other Ambulatory Visit: Payer: Self-pay | Admitting: Family Medicine

## 2015-05-17 LAB — URINE CULTURE: Culture: >=100000

## 2015-05-17 NOTE — Telephone Encounter (Signed)
[facility] is seeing patient and RN Estill Bamberg is calling for PT order for eval. Says he has had 10 falls in the past 3 months. Was seen in ER for a fall this weekend and that is how they got the nursing eval.

## 2015-05-17 NOTE — Telephone Encounter (Signed)
Ok to provide verbal

## 2015-05-17 NOTE — Telephone Encounter (Signed)
Yes thanks 

## 2015-05-18 NOTE — Progress Notes (Signed)
ED Antimicrobial Stewardship Positive Culture Follow Up   Robert Salinas. is an 81 y.o. male who presented to [facility] on 05/15/2015 with a chief complaint of  Chief Complaint  Patient presents with  . Hip Pain  . Fall    Recent Results (from the past 720 hour(s))  Urine culture     Status: None   Collection Time: 05/15/15  2:18 PM  Result Value Ref Range Status   Specimen Description URINE, CLEAN CATCH  Final   Special Requests NONE  Final   Culture >=100,000 COLONIES/mL ENTEROCOCCUS SPECIES  Final   Report Status 05/17/2015 FINAL  Final   Organism ID, Bacteria ENTEROCOCCUS SPECIES  Final      Susceptibility   Enterococcus species - MIC*    AMPICILLIN <=2 SENSITIVE Sensitive     LEVOFLOXACIN 1 SENSITIVE Sensitive     NITROFURANTOIN <=16 SENSITIVE Sensitive     VANCOMYCIN 1 SENSITIVE Sensitive     * >=100,000 COLONIES/mL ENTEROCOCCUS SPECIES    [x]  Treated with cephalexin, organism resistant to prescribed antimicrobial  New antibiotic prescription: Amoxicillin 500mg  BID x7 days  ED Provider: Harrison Mons, PA-C   Judieth Keens, PharmD 05/18/2015, 9:19 AM PGY1 Resident Phone# [phone]

## 2015-05-18 NOTE — Telephone Encounter (Signed)
LM for [employer].

## 2015-05-18 NOTE — Telephone Encounter (Signed)
Verbal order given to [facility].

## 2015-05-19 ENCOUNTER — Telehealth (HOSPITAL_COMMUNITY): Payer: Self-pay

## 2015-05-19 NOTE — Telephone Encounter (Signed)
Post ED Visit - Positive Culture Follow-up: Successful Patient Follow-Up  Culture assessed and recommendations reviewed by: []  Elenor Quinones, Pharm.D. []  Heide Guile, Pharm.D., BCPS [x]  Parks Neptune, Pharm.D. []  Alycia Rossetti, Pharm.D., BCPS []  [place], Pharm.D., BCPS, AAHIVP []  Legrand Como, Pharm.D., BCPS, AAHIVP []  Milus Glazier, Pharm.D. []  Rob [place], [place].D.  Positive urine culture  []  Patient discharged without antimicrobial prescription and treatment is now indicated [x]  Organism is resistant to prescribed ED discharge antimicrobial []  Patient with positive blood cultures  Changes discussed with ED provider: Katherine Roan New antibiotic prescription amoxicillin 500mg  bid x 7 days  Attempting to contact  Ileene Musa 05/19/2015, 7:34 AM

## 2015-05-20 ENCOUNTER — Telehealth (HOSPITAL_BASED_OUTPATIENT_CLINIC_OR_DEPARTMENT_OTHER): Payer: Self-pay | Admitting: Emergency Medicine

## 2015-05-25 ENCOUNTER — Telehealth: Payer: Self-pay | Admitting: Family Medicine

## 2015-05-25 ENCOUNTER — Ambulatory Visit: Payer: Medicare Other | Admitting: Adult Health

## 2015-05-25 NOTE — Telephone Encounter (Signed)
ok 

## 2015-05-25 NOTE — Telephone Encounter (Signed)
Pt has been schedule.

## 2015-05-25 NOTE — Telephone Encounter (Signed)
Robert Salinas [facility] would like verbal order for wound care to treat  pressure ulcer on heel. [name] would like to paint wound daily with betadine . Pt has an appt tomorrow

## 2015-05-25 NOTE — Telephone Encounter (Signed)
Can I use sda slot. Pt is having leg pain.

## 2015-05-26 ENCOUNTER — Encounter: Payer: Self-pay | Admitting: Family Medicine

## 2015-05-26 ENCOUNTER — Ambulatory Visit (INDEPENDENT_AMBULATORY_CARE_PROVIDER_SITE_OTHER): Payer: Medicare Other | Admitting: Family Medicine

## 2015-05-26 VITALS — BP 130/60 | HR 71 | Temp 97.7°F | Wt 198.0 lb

## 2015-05-26 DIAGNOSIS — L97529 Non-pressure chronic ulcer of other part of left foot with unspecified severity: Secondary | ICD-10-CM | POA: Diagnosis not present

## 2015-05-26 DIAGNOSIS — E11621 Type 2 diabetes mellitus with foot ulcer: Secondary | ICD-10-CM

## 2015-05-26 NOTE — Telephone Encounter (Signed)
Returned [employer] call and provided VO.

## 2015-05-26 NOTE — Patient Instructions (Signed)
We will call youabout your referral to podiatry. If you do not hear within 2 days, give [place] a call.   Continue to keep pressure off that area- prop heel up at night as well as kep propped up when sitting in chair.

## 2015-05-26 NOTE — Progress Notes (Signed)
[name], MD  Subjective:  Trigo Balakrishnan. is a 81 y.o. year old very pleasant male patient who presents for/with See problem oriented charting ROS- level 5 caveat through dementia. Through wife- no expanding redness, fever, chills, nausea, vomiting. No change in personality or activity level  Past Medical History-  Patient Active Problem List   Diagnosis Date Noted  . DNR (do not resuscitate) 12/08/2013    Priority: High  . CKD (chronic kidney disease), stage III 02/21/2011    Priority: High  . Diabetes mellitus type II, controlled ([place]) 06/09/2009    Priority: High  . ALZHEIMER'S DISEASE, EARLY 06/09/2009    Priority: High  . Atrial fibrillation ([place]) 06/09/2009    Priority: High  . Hypothyroid 10/09/2011    Priority: Medium  . Hyperlipidemia 06/09/2009    Priority: Medium  . Essential hypertension 06/09/2009    Priority: Medium    Medications- reviewed and updated Current Outpatient Prescriptions  Medication Sig Dispense Refill  . amLODipine (NORVASC) 10 MG tablet TAKE 1 TABLET DAILY 90 tablet 3  . amoxicillin (AMOXIL) 500 MG capsule Take 500 mg by mouth 2 (two) times daily.    Marland Kitchen aspirin 81 MG tablet Take 81 mg by mouth daily.      Marland Kitchen atorvastatin (LIPITOR) 10 MG tablet TAKE 1 TABLET DAILY 90 tablet 1  . donepezil (ARICEPT) 10 MG tablet TAKE 1 TABLET AT BEDTIME 90 tablet 3  . gabapentin (NEURONTIN) 100 MG capsule TAKE ONE CAPSULE BY MOUTH 3 TIMES A DAY 90 capsule 2  . glimepiride (AMARYL) 2 MG tablet Take 1 tablet (2 mg total) by mouth daily before breakfast. 7 tablet 0  . levothyroxine (SYNTHROID, LEVOTHROID) 75 MCG tablet TAKE 1 TABLET DAILY BEFORE BREAKFAST 90 tablet 1  . memantine (NAMENDA) 5 MG tablet TAKE 1 TABLET TWICE A DAY 180 tablet 3   No current facility-administered medications for this visit.    Objective: BP 130/60 mmHg  Pulse 71  Temp(Src) 97.7 F (36.5 C)  Wt 198 lb (89.812 kg) Gen: NAD, resting comfortably, pleasantly confused CV: RRR  no murmurs rubs or gallops Lungs: CTAB no crackles, wheeze, rhonchi Ext: no edema Skin: warm, dry, see below Neuro: grossly normal, moves all extremities  Callus was removed to determine depth of ulcer. Stage II ulcer with good granulation tissue was noted/explored on left heel. Some are covered in photograph but able to be explored on exam without signs of necrosis. No tunneling. No obvious subq fat or muscle noted.      Assessment/Plan:  Diabetic foot ulcer S: seen in ED 05/15/15 for UTI and fall. Has not been as active since that time. Has been laying around more and developed an ulcer on left heel when in bed. Seen by wound care through home health and advised MD visit. No worsening since it started. Wife tried warm water soaks and was told to avoid now. Basically just keeping dry A/P: refer to podiatry for diabetic foot ulcer. a1c in January was 7.3 and patient had not returned as instructed. taking meds as noted above. For now, keep heel elevated in bed and when sitting in chair. telfa nonadherent dressing applied and instructed to try to keep dry until podiatry follow up today or tomorrow. No current signs of infection such as cellulitis and minimal depth to suggest osteo so no MRI ordreed  Urgent Return precautions advised.   Orders Placed This Encounter  Procedures  . Ambulatory referral to Podiatry    Referral Priority:  Urgent    Referral Type:  Consultation    Referral Reason:  Specialty Services Required    Requested Specialty:  Podiatry    Number of Visits Requested:  1   Finishing for UTI Meds ordered this encounter  Medications  . amoxicillin (AMOXIL) 500 MG capsule    Sig: Take 500 mg by mouth 2 (two) times daily.

## 2015-06-14 ENCOUNTER — Other Ambulatory Visit: Payer: Self-pay | Admitting: Family Medicine

## 2015-07-11 ENCOUNTER — Other Ambulatory Visit: Payer: Self-pay | Admitting: Family Medicine

## 2015-07-26 ENCOUNTER — Other Ambulatory Visit: Payer: Self-pay | Admitting: Family Medicine

## 2015-08-13 ENCOUNTER — Other Ambulatory Visit: Payer: Self-pay | Admitting: Family Medicine

## 2015-09-17 ENCOUNTER — Other Ambulatory Visit: Payer: Self-pay | Admitting: Family Medicine

## 2015-09-17 MED ORDER — GABAPENTIN 100 MG PO CAPS: 100.0000 mg | ORAL_CAPSULE | Freq: Three times a day (TID) | ORAL | Status: DC

## 2015-10-06 ENCOUNTER — Other Ambulatory Visit: Payer: Self-pay

## 2015-10-22 ENCOUNTER — Other Ambulatory Visit: Payer: Self-pay

## 2015-10-22 ENCOUNTER — Telehealth: Payer: Self-pay | Admitting: *Deleted

## 2015-10-22 MED ORDER — GLIMEPIRIDE 4 MG PO TABS: 4.0000 mg | ORAL_TABLET | Freq: Every day | ORAL | Status: DC

## 2015-10-22 NOTE — Telephone Encounter (Signed)
Wife came into office today stating her husband's (patient) blood glucose was in the 400s.  Patient and wife initially went to the Urgent Care to get his toe nails clipped. Per the wife, the staff at urgent care stated they could not clip his toenails due to his fingerstick glucose being so high. Patient and wife then came to our office for further evaluation. During triage, the patient's wife stated her husband is not aware of being diabetic nor being on any medication to manage his sugars. Once made patient and wife aware the patient is diabetic and is supposed to be taking Glimepiride (AMARYL) 2 mg tablet, they admit to not getting medication refilled in over 6 months. Patient's wife states they check his sugars once a month and they usually run in the 350's +. Patient confirms he has had an increase in falls, falling roughly 2-3's a week and increase in fatigue over the past 3 months.   Fingerstick during triage: 354 Vitals during triage: HR 66 O2 97% RA  BP 102/60  RR 18  Made Dr. Yong Channel aware of conversation and the above data. Dr. Yong Channel did speak with patient and wife  in procedure room and made them aware he is going to increase the Glimepiride (AMARYL) 2 mg tablet to 4 mg tablets and to drink plenty of fluids. Appointment was scheduled with Dr. Yong Channel for May 10th at 9:15am. Patient and wife verbalized understanding and confirmed there are no barriers to getting medication nor barriers to making appointment next week. Advised patient if his symptoms start to increase or he begins to decline in any way, to go to the closest ED. Patient and wife verbalized understanding.

## 2015-10-27 ENCOUNTER — Ambulatory Visit: Payer: Medicare Other | Admitting: Podiatry

## 2015-10-27 ENCOUNTER — Encounter: Payer: Self-pay | Admitting: Family Medicine

## 2015-10-27 ENCOUNTER — Ambulatory Visit (INDEPENDENT_AMBULATORY_CARE_PROVIDER_SITE_OTHER): Payer: Medicare Other | Admitting: Family Medicine

## 2015-10-27 VITALS — BP 120/64 | HR 75 | Temp 97.5°F | Wt 198.0 lb

## 2015-10-27 DIAGNOSIS — E119 Type 2 diabetes mellitus without complications: Secondary | ICD-10-CM | POA: Diagnosis not present

## 2015-10-27 DIAGNOSIS — E785 Hyperlipidemia, unspecified: Secondary | ICD-10-CM

## 2015-10-27 DIAGNOSIS — N183 Chronic kidney disease, stage 3 unspecified: Secondary | ICD-10-CM

## 2015-10-27 DIAGNOSIS — E038 Other specified hypothyroidism: Secondary | ICD-10-CM | POA: Diagnosis not present

## 2015-10-27 DIAGNOSIS — E034 Atrophy of thyroid (acquired): Secondary | ICD-10-CM | POA: Diagnosis not present

## 2015-10-27 DIAGNOSIS — I10 Essential (primary) hypertension: Secondary | ICD-10-CM

## 2015-10-27 LAB — CBC
HCT: 46.2 % (ref 39.0–52.0)
HEMOGLOBIN: 15.5 g/dL (ref 13.0–17.0)
MCHC: 33.6 g/dL (ref 30.0–36.0)
MCV: 92.4 fl (ref 78.0–100.0)
PLATELETS: 162 10*3/uL (ref 150.0–400.0)
RBC: 5 Mil/uL (ref 4.22–5.81)
RDW: 13.9 % (ref 11.5–15.5)
WBC: 6.7 10*3/uL (ref 4.0–10.5)

## 2015-10-27 LAB — COMPREHENSIVE METABOLIC PANEL
ALBUMIN: 4.1 g/dL (ref 3.5–5.2)
ALT: 22 U/L (ref 0–53)
AST: 24 U/L (ref 0–37)
Alkaline Phosphatase: 71 U/L (ref 39–117)
BILIRUBIN TOTAL: 0.9 mg/dL (ref 0.2–1.2)
BUN: 31 mg/dL — ABNORMAL HIGH (ref 6–23)
CALCIUM: 9.4 mg/dL (ref 8.4–10.5)
CHLORIDE: 106 meq/L (ref 96–112)
CO2: 25 meq/L (ref 19–32)
CREATININE: 1.67 mg/dL — AB (ref 0.40–1.50)
GFR: 41.6 mL/min — AB (ref >60.00)
Glucose, Bld: 163 mg/dL — ABNORMAL HIGH (ref 70–99)
Potassium: 4.2 mEq/L (ref 3.5–5.1)
Sodium: 143 mEq/L (ref 135–145)
Total Protein: 6.5 g/dL (ref 6.0–8.3)

## 2015-10-27 LAB — GLUCOSE, POCT (MANUAL RESULT ENTRY): POC Glucose: 179 mg/dl — AB (ref 70–99)

## 2015-10-27 LAB — TSH: TSH: 3.2 u[IU]/mL (ref 0.35–4.50)

## 2015-10-27 LAB — LDL CHOLESTEROL, DIRECT: LDL DIRECT: 77 mg/dL

## 2015-10-27 LAB — POCT GLYCOSYLATED HEMOGLOBIN (HGB A1C): Hemoglobin A1C: 10.3

## 2015-10-27 MED ORDER — ONETOUCH ULTRASOFT LANCETS MISC: Status: AC

## 2015-10-27 MED ORDER — GLUCOSE BLOOD VI STRP: ORAL_STRIP | Status: AC

## 2015-10-27 NOTE — Assessment & Plan Note (Signed)
S: Last GFR noted right around 30. Excess thirst from diabetes.  A/P: we will update BMET. The issue for his diabetes is medication choices severely limited by kidney function- if this has improved some may have some more flexibility but wonder if it will have worsened with poorly controlled diabetes. May need renal consult if progressed to CKD stage IV

## 2015-10-27 NOTE — Assessment & Plan Note (Signed)
S: controlled. On amlodipine 10mg   BP Readings from Last 3 Encounters:  10/27/15 120/64  05/26/15 130/60  05/15/15 116/69  A/P:Continue current meds:  Doing well

## 2015-10-27 NOTE — Assessment & Plan Note (Addendum)
S: poorly controlled on atorvastatin 10mg  last check. No myalgias.  Lab Results  Component Value Date   CHOL 205* 07/01/2014   HDL 56.00 07/01/2014   LDLCALC 116* 07/01/2014   TRIG 164.0* 07/01/2014   CHOLHDL 4 07/01/2014   A/P: update direct LDL- if over 100, increase to 20mg  likely. On other hand- 10 year life expectancy is low- we could also focus on quality of life by reducing medications and setting higher a1c goals but then may worsen CKD which could worsen quality of life- tough balance in medically complex patient also with dementia

## 2015-10-27 NOTE — Progress Notes (Signed)
Subjective:  Robert Salinas. is a 82 y.o. year old very pleasant male patient who presents for/with See problem oriented charting ROS- polyuria improving since diet changes last week. No chest pain or shortness of breath. No headache or blurry vision. .see any ROS included in HPI as well.   Past Medical History-  Patient Active Problem List   Diagnosis Date Noted  . DNR (do not resuscitate) 12/08/2013    Priority: High  . CKD (chronic kidney disease), stage III 02/21/2011    Priority: High  . Diabetes mellitus type II, controlled ([place]) 06/09/2009    Priority: High  . ALZHEIMER'S DISEASE, EARLY 06/09/2009    Priority: High  . Atrial fibrillation ([place]) 06/09/2009    Priority: High  . Hypothyroid 10/09/2011    Priority: Medium  . Hyperlipidemia 06/09/2009    Priority: Medium  . Essential hypertension 06/09/2009    Priority: Medium    Medications- reviewed and updated Current Outpatient Prescriptions  Medication Sig Dispense Refill  . amLODipine (NORVASC) 10 MG tablet TAKE 1 TABLET DAILY 90 tablet 2  . aspirin 81 MG tablet Take 81 mg by mouth daily.      Marland Kitchen atorvastatin (LIPITOR) 10 MG tablet TAKE 1 TABLET DAILY 90 tablet 2  . donepezil (ARICEPT) 10 MG tablet TAKE 1 TABLET AT BEDTIME 90 tablet 3  . gabapentin (NEURONTIN) 100 MG capsule Take 1 capsule (100 mg total) by mouth 3 (three) times daily. 270 capsule 2  . glimepiride (AMARYL) 4 MG tablet Take 1 tablet (4 mg total) by mouth daily before breakfast. 30 tablet 5  . levothyroxine (SYNTHROID, LEVOTHROID) 75 MCG tablet TAKE 1 TABLET DAILY BEFORE BREAKFAST 90 tablet 3  . glucose blood (ONETOUCH VERIO) test strip Use to test blood sugars daily. Dx: E11.9 100 each 12  . Lancets (ONETOUCH ULTRASOFT) lancets Use to test blood sugars daily. Dx: E11.9 100 each 12   No current facility-administered medications for this visit.    Objective: BP 120/64 mmHg  Pulse 75  Temp(Src) 97.5 F (36.4 C)  Wt 198 lb (89.812 kg) Gen: NAD,  resting comfortably Slightly dry mucus membranes CV: RRR no murmurs rubs or gallops Lungs: CTAB no crackles, wheeze, rhonchi Abdomen: soft/nontender/nondistended/normal bowel sounds. No rebound or guarding.  Ext: no edema Skin: warm, dry Neuro: grossly normal, moves all extremities  Assessment/Plan:  Diabetes mellitus type II, controlled S: poorly controlled on amaryl 2mg  but had run out and sugars spiked further. Previously reasonable diet and a1c had at least been 7.5 or less.  Patient went ot urgent care and found sugar over 400. Cam ein last week and we increased to 4mg . Patient and wife not ready for insulin at this poin tthough they know may be needed. His diet has been horrendous- cakes, cookies, sweet tea, 3-4 ensure a day CBGs- gave new meter Exercise and diet- very poor choices  Lab Results  Component Value Date   HGBA1C 10.3 10/27/2015   HGBA1C 7.3* 07/01/2014   HGBA1C 6.6* 12/08/2013   A/P: continue amaryl 4mg - come back in 2 weeks with improved dietary intake. We will likely go up to amaryl 8mg  at that time. If cannot get a1c at least under 9 within 3 months from that point then we may have to go insulin route regardless.   CKD (chronic kidney disease), stage III S: Last GFR noted right around 30. Excess thirst from diabetes.  A/P: we will update BMET. The issue for his diabetes is medication choices severely limited by  kidney function- if this has improved some may have some more flexibility but wonder if it will have worsened with poorly controlled diabetes. May need renal consult if progressed to CKD stage IV   Hyperlipidemia S: poorly controlled on atorvastatin 10mg  last check. No myalgias.  Lab Results  Component Value Date   CHOL 205* 07/01/2014   HDL 56.00 07/01/2014   LDLCALC 116* 07/01/2014   TRIG 164.0* 07/01/2014   CHOLHDL 4 07/01/2014   A/P: update direct LDL- if over 100, increase to 20mg  likely. On other hand- 10 year life expectancy is low- we could  also focus on quality of life by reducing medications and setting higher a1c goals but then may worsen CKD which could worsen quality of life- tough balance in medically complex patient also with dementia    Essential hypertension S: controlled. On amlodipine 10mg   BP Readings from Last 3 Encounters:  10/27/15 120/64  05/26/15 130/60  05/15/15 116/69  A/P:Continue current meds:  Doing well   2 weeks. Return precautions advised.   Orders Placed This Encounter  Procedures  . CBC    [place]  . Comprehensive metabolic panel    [place]  . TSH    [place]  . LDL cholesterol, direct    [place]  . POC Glucose (CBG)  . POC HgB A1c    Meds ordered this encounter  Medications  . glucose blood (ONETOUCH VERIO) test strip    Sig: Use to test blood sugars daily. Dx: E11.9    Dispense:  100 each    Refill:  12  . Lancets (ONETOUCH ULTRASOFT) lancets    Sig: Use to test blood sugars daily. Dx: E11.9    Dispense:  100 each    Refill:  12    [name], MD

## 2015-10-27 NOTE — Patient Instructions (Signed)
Cut out any beverage for Mr. Babar that is not water.  Focus on 3-5 servings of veggies a day as foundation for diet.  Have some meat about 1/4 of your plate twice a day No cookies/cakes/sweets.  White breads, pasta, rice also drives sugar up  Follow up in 2 weeks Check blood sugar every morning Continue amaryl 4mg  once a day (we may increase next visit) Let me know immediately if any sugar under 80  Hypoglycemia Low blood sugar (hypoglycemia) means that the level of sugar in your blood is lower than it should be. Signs of low blood sugar include:  Getting sweaty.  Feeling hungry.  Feeling dizzy or weak.  Feeling sleepier than normal.  Feeling nervous.  Headaches.  Having a fast heartbeat. Low blood sugar can happen fast and can be an emergency. Your doctor can do tests to check your blood sugar level. You can have low blood sugar and not have diabetes. HOME CARE  Check your blood sugar as told by your doctor. If it is less than 70 mg/dl or as told by your doctor, take 1 of the following:  3 to 4 glucose tablets.   cup clear juice.   cup soda pop, not diet.  1 cup milk.  5 to 6 hard candies.  Recheck blood sugar after 15 minutes. Repeat until it is at the right level.  Eat a snack if it is more than 1 hour until the next meal.  Only take medicine as told by your doctor.  Do not skip meals. Eat on time.  Do not drink alcohol except with meals.  Check your blood glucose before driving.  Check your blood glucose before and after exercise.  Always carry treatment with you, such as glucose pills.  Always wear a medical alert bracelet if you have diabetes. GET HELP RIGHT AWAY IF:   Your blood glucose goes below 70 mg/dl or as told by your doctor, and you:  Are confused.  Are not able to swallow.  Pass out (faint).  You cannot treat yourself. You may need someone to help you.  You have low blood sugar problems often.  You have problems from your  medicines.  You are not feeling better after 3 to 4 days.  You have vision changes. MAKE SURE YOU:   Understand these instructions.  Will watch this condition.  Will get help right away if you are not doing well or get worse.   This information is not intended to replace advice given to you by your health care provider. Make sure you discuss any questions you have with your health care provider.   Document Released: 08/30/2009 Document Revised: 06/26/2014 Document Reviewed: 02/09/2015 Elsevier Interactive Patient Education [employer].

## 2015-10-27 NOTE — Assessment & Plan Note (Signed)
S: poorly controlled on amaryl 2mg  but had run out and sugars spiked further. Previously reasonable diet and a1c had at least been 7.5 or less.  Patient went ot urgent care and found sugar over 400. Cam ein last week and we increased to 4mg . Patient and wife not ready for insulin at this poin tthough they know may be needed. His diet has been horrendous- cakes, cookies, sweet tea, 3-4 ensure a day CBGs- gave new meter Exercise and diet- very poor choices  Lab Results  Component Value Date   HGBA1C 10.3 10/27/2015   HGBA1C 7.3* 07/01/2014   HGBA1C 6.6* 12/08/2013   A/P: continue amaryl 4mg - come back in 2 weeks with improved dietary intake. We will likely go up to amaryl 8mg  at that time. If cannot get a1c at least under 9 within 3 months from that point then we may have to go insulin route regardless.

## 2015-11-03 ENCOUNTER — Ambulatory Visit (INDEPENDENT_AMBULATORY_CARE_PROVIDER_SITE_OTHER): Payer: Medicare Other | Admitting: Podiatry

## 2015-11-03 ENCOUNTER — Encounter: Payer: Self-pay | Admitting: Podiatry

## 2015-11-03 DIAGNOSIS — M79676 Pain in unspecified toe(s): Secondary | ICD-10-CM | POA: Diagnosis not present

## 2015-11-03 DIAGNOSIS — E119 Type 2 diabetes mellitus without complications: Secondary | ICD-10-CM | POA: Diagnosis not present

## 2015-11-03 DIAGNOSIS — B351 Tinea unguium: Secondary | ICD-10-CM

## 2015-11-03 NOTE — Progress Notes (Deleted)
   Subjective:    Patient ID: Robert Salinas., male    DOB: 01/17/1927, 82 y.o.   MRN: [id number]  HPI    Review of Systems  All other systems reviewed and are negative.      Objective:   Physical Exam        Assessment & Plan:

## 2015-11-03 NOTE — Progress Notes (Signed)
Patient ID: [name]., male   DOB: [birth year], 82 y.o.   MRN: [id number] Complaint:  Visit Type: Patient returns to my office for continued preventative foot care services. Complaint: Patient states" my nails have grown long and thick and become painful to walk and wear shoes" Patient has been diagnosed with DM with no foot complications. The patient presents for preventative foot care services. No changes to ROS  Podiatric Exam: Vascular: dorsalis pedis and posterior tibial pulses are palpable bilateral. Capillary return is immediate. Temperature gradient is WNL. Skin turgor WNL  Sensorium: Normal Semmes Weinstein monofilament test. Normal tactile sensation bilaterally. Nail Exam: Pt has thick disfigured discolored nails with subungual debris noted bilateral entire nail hallux through fifth toenails Ulcer Exam: There is no evidence of ulcer or pre-ulcerative changes or infection. Orthopedic Exam: Muscle tone and strength are WNL. No limitations in general ROM. No crepitus or effusions noted. Foot type and digits show no abnormalities. Bony prominences are unremarkable. Skin: No Porokeratosis. No infection or ulcers  Diagnosis:  Onychomycosis, , Pain in right toe, pain in left toes  Treatment & Plan Procedures and Treatment: Consent by patient was obtained for treatment procedures. The patient understood the discussion of treatment and procedures well. All questions were answered thoroughly reviewed. Debridement of mycotic and hypertrophic toenails, 1 through 5 bilateral and clearing of subungual debris. No ulceration, no infection noted.  Return Visit-Office Procedure: Patient instructed to return to the office for a follow up visit 3 months for continued evaluation and treatment.    Gardiner Barefoot DPM

## 2015-12-02 ENCOUNTER — Encounter: Payer: Self-pay | Admitting: Family Medicine

## 2015-12-02 ENCOUNTER — Ambulatory Visit (INDEPENDENT_AMBULATORY_CARE_PROVIDER_SITE_OTHER): Payer: Medicare Other | Admitting: Family Medicine

## 2015-12-02 VITALS — BP 118/70 | HR 68 | Temp 98.0°F | Ht 70.5 in | Wt 203.0 lb

## 2015-12-02 DIAGNOSIS — E119 Type 2 diabetes mellitus without complications: Secondary | ICD-10-CM | POA: Diagnosis not present

## 2015-12-02 LAB — MICROALBUMIN / CREATININE URINE RATIO
CREATININE, U: 138 mg/dL
MICROALB/CREAT RATIO: 13.8 mg/g (ref 0.0–30.0)
Microalb, Ur: 19.1 mg/dL — ABNORMAL HIGH (ref 0.0–1.9)

## 2015-12-02 NOTE — Progress Notes (Signed)
Subjective:  Robert Salinas. is a 82 y.o. year old very pleasant male patient who presents for/with See problem oriented charting ROS- no hypoglycemia. No chest pain or shortness of breath. No headache or blurry vision. .see any ROS included in HPI as well.   Past Medical History-  Patient Active Problem List   Diagnosis Date Noted  . DNR (do not resuscitate) 12/08/2013    Priority: High  . CKD (chronic kidney disease), stage III 02/21/2011    Priority: High  . Diabetes mellitus type II, controlled ([place]) 06/09/2009    Priority: High  . ALZHEIMER'S DISEASE, EARLY 06/09/2009    Priority: High  . Atrial fibrillation ([place]) 06/09/2009    Priority: High  . Hypothyroid 10/09/2011    Priority: Medium  . Hyperlipidemia 06/09/2009    Priority: Medium  . Essential hypertension 06/09/2009    Priority: Medium    Medications- reviewed and updated Current Outpatient Prescriptions  Medication Sig Dispense Refill  . amLODipine (NORVASC) 10 MG tablet TAKE 1 TABLET DAILY 90 tablet 2  . aspirin 81 MG tablet Take 81 mg by mouth daily.      Marland Kitchen atorvastatin (LIPITOR) 10 MG tablet TAKE 1 TABLET DAILY 90 tablet 2  . donepezil (ARICEPT) 10 MG tablet TAKE 1 TABLET AT BEDTIME 90 tablet 3  . gabapentin (NEURONTIN) 100 MG capsule Take 1 capsule (100 mg total) by mouth 3 (three) times daily. 270 capsule 2  . glimepiride (AMARYL) 4 MG tablet Take 1 tablet (4 mg total) by mouth daily before breakfast. 30 tablet 5  . glucose blood (ONETOUCH VERIO) test strip Use to test blood sugars daily. Dx: E11.9 100 each 12  . Lancets (ONETOUCH ULTRASOFT) lancets Use to test blood sugars daily. Dx: E11.9 100 each 12  . levothyroxine (SYNTHROID, LEVOTHROID) 75 MCG tablet TAKE 1 TABLET DAILY BEFORE BREAKFAST 90 tablet 3   No current facility-administered medications for this visit.    Objective: BP 118/70 mmHg  Pulse 68  Temp(Src) 98 F (36.7 C) (Oral)  Ht 5' 10.5" (1.791 m)  Wt 203 lb (92.08 kg)  BMI 28.71 kg/m2   SpO2 97% Gen: NAD, resting comfortably CV: RRR no murmurs rubs or gallops Lungs: CTAB no crackles, wheeze, rhonchi Abdomen: soft/nontender/nondistended/normal bowel sounds. Obese Ext: no edema Skin: warm, dry, no rash Neuro: grossly normal, moves all extremities  Diabetic Foot Exam - Simple   Simple Foot Form  Diabetic Foot exam was performed with the following findings:  Yes 12/02/2015  9:44 AM  Visual Inspection  No deformities, no ulcerations, no other skin breakdown bilaterally:  Yes  Sensation Testing  Intact to touch and monofilament testing bilaterally:  Yes  Pulse Check  Posterior Tibialis and Dorsalis pulse intact bilaterally:  Yes  Comments     Assessment/Plan:  Diabetes mellitus type II, controlled S: poorly controlled as he had stopped taking his 2mg  amaryl and sugars went up over 400. On 10/27/15 we increased to 4mg  with plan 2 week follow up (over a month at this point). Diet had been terrible- cakes, cookies, sweet tea plus 3-4 ensure a day.  CBGs- 110-120, 128 as the high in the last. Down from the 400s Exercise and diet- has tightened diet up tremendously  Lab Results  Component Value Date   HGBA1C 10.3 10/27/2015   HGBA1C 7.3* 07/01/2014   HGBA1C 6.6* 12/08/2013   A/P: DRASTIC improvement- I think primarily diet related. Also compliant with amaryl 4mg . Other options limited by GFR but fortunately last test  was close to 40 as previously closer to 30. Continue current meds and eating habits and follow up 3 months. Advised eye exam- has seen shapiro in past.     Return in about 3 months (around 03/03/2016) for follow up- or sooner if needed.  Orders Placed This Encounter  Procedures  . Microalbumin / creatinine urine ratio    [place]   The duration of face-to-face time during this visit was 15 minutes. Greater than 50% of this time was spent in counseling, explanation of diagnosis, planning of further management, and/or coordination of care.    Return  precautions advised.  [name], MD

## 2015-12-02 NOTE — Patient Instructions (Addendum)
Drop off a urine before you leave  No change in medications  Keep with the healthier eating! It is really help  Get an eye exam

## 2015-12-02 NOTE — Assessment & Plan Note (Signed)
S: poorly controlled as he had stopped taking his 2mg  amaryl and sugars went up over 400. On 10/27/15 we increased to 4mg  with plan 2 week follow up (over a month at this point). Diet had been terrible- cakes, cookies, sweet tea plus 3-4 ensure a day.  CBGs- 110-120, 128 as the high in the last. Down from the 400s Exercise and diet- has tightened diet up tremendously  Lab Results  Component Value Date   HGBA1C 10.3 10/27/2015   HGBA1C 7.3* 07/01/2014   HGBA1C 6.6* 12/08/2013   A/P: DRASTIC improvement- I think primarily diet related. Also compliant with amaryl 4mg . Other options limited by GFR but fortunately last test was close to 40 as previously closer to 30. Continue current meds and eating habits and follow up 3 months. Advised eye exam- has seen shapiro in past.

## 2015-12-02 NOTE — Progress Notes (Signed)
Pre visit review using our clinic review tool, if applicable. No additional management support is needed unless otherwise documented below in the visit note. 

## 2015-12-18 ENCOUNTER — Telehealth: Payer: Self-pay

## 2015-12-18 NOTE — Telephone Encounter (Signed)
Not a candidate for [facility]

## 2016-01-26 ENCOUNTER — Ambulatory Visit: Payer: Medicare Other | Admitting: Podiatry

## 2016-03-03 ENCOUNTER — Encounter: Payer: Self-pay | Admitting: Family Medicine

## 2016-03-03 ENCOUNTER — Ambulatory Visit (INDEPENDENT_AMBULATORY_CARE_PROVIDER_SITE_OTHER): Payer: Medicare Other | Admitting: Family Medicine

## 2016-03-03 VITALS — BP 104/62 | HR 72 | Temp 97.7°F | Wt 201.8 lb

## 2016-03-03 DIAGNOSIS — I4891 Unspecified atrial fibrillation: Secondary | ICD-10-CM

## 2016-03-03 DIAGNOSIS — I10 Essential (primary) hypertension: Secondary | ICD-10-CM | POA: Diagnosis not present

## 2016-03-03 DIAGNOSIS — Z23 Encounter for immunization: Secondary | ICD-10-CM

## 2016-03-03 DIAGNOSIS — E119 Type 2 diabetes mellitus without complications: Secondary | ICD-10-CM

## 2016-03-03 DIAGNOSIS — E034 Atrophy of thyroid (acquired): Secondary | ICD-10-CM | POA: Diagnosis not present

## 2016-03-03 DIAGNOSIS — G309 Alzheimer's disease, unspecified: Secondary | ICD-10-CM

## 2016-03-03 DIAGNOSIS — Z66 Do not resuscitate: Secondary | ICD-10-CM

## 2016-03-03 DIAGNOSIS — F0280 Dementia in other diseases classified elsewhere without behavioral disturbance: Secondary | ICD-10-CM

## 2016-03-03 DIAGNOSIS — N183 Chronic kidney disease, stage 3 unspecified: Secondary | ICD-10-CM

## 2016-03-03 DIAGNOSIS — E038 Other specified hypothyroidism: Secondary | ICD-10-CM | POA: Diagnosis not present

## 2016-03-03 DIAGNOSIS — E785 Hyperlipidemia, unspecified: Secondary | ICD-10-CM | POA: Diagnosis not present

## 2016-03-03 LAB — HEMOGLOBIN A1C: Hgb A1c MFr Bld: 6.4 % (ref 4.6–6.5)

## 2016-03-03 LAB — COMPREHENSIVE METABOLIC PANEL
ALBUMIN: 4.1 g/dL (ref 3.5–5.2)
ALK PHOS: 91 U/L (ref 39–117)
ALT: 11 U/L (ref 0–53)
AST: 15 U/L (ref 0–37)
BUN: 36 mg/dL — AB (ref 6–23)
CHLORIDE: 108 meq/L (ref 96–112)
CO2: 28 meq/L (ref 19–32)
Calcium: 9.1 mg/dL (ref 8.4–10.5)
Creatinine, Ser: 1.88 mg/dL — ABNORMAL HIGH (ref 0.40–1.50)
GFR: 36.26 mL/min — ABNORMAL LOW (ref >60.00)
Glucose, Bld: 108 mg/dL — ABNORMAL HIGH (ref 70–99)
POTASSIUM: 4.6 meq/L (ref 3.5–5.1)
SODIUM: 143 meq/L (ref 135–145)
TOTAL PROTEIN: 6.5 g/dL (ref 6.0–8.3)
Total Bilirubin: 1.1 mg/dL (ref 0.2–1.2)

## 2016-03-03 LAB — CBC
HEMATOCRIT: 46.5 % (ref 39.0–52.0)
Hemoglobin: 16.1 g/dL (ref 13.0–17.0)
MCHC: 34.6 g/dL (ref 30.0–36.0)
MCV: 90.2 fl (ref 78.0–100.0)
Platelets: 162 10*3/uL (ref 150.0–400.0)
RBC: 5.15 Mil/uL (ref 4.22–5.81)
RDW: 13.9 % (ref 11.5–15.5)
WBC: 7.2 10*3/uL (ref 4.0–10.5)

## 2016-03-03 LAB — TSH: TSH: 2.08 u[IU]/mL (ref 0.35–4.50)

## 2016-03-03 LAB — LDL CHOLESTEROL, DIRECT: Direct LDL: 68 mg/dL

## 2016-03-03 MED ORDER — ASPIRIN 81 MG PO TABS: 81.0000 mg | ORAL_TABLET | Freq: Every day | ORAL | Status: DC

## 2016-03-03 MED ORDER — AMLODIPINE BESYLATE 5 MG PO TABS: 5.0000 mg | ORAL_TABLET | Freq: Every day | ORAL | 3 refills | Status: DC

## 2016-03-03 NOTE — Progress Notes (Signed)
Pre visit review using our clinic review tool, if applicable. No additional management support is needed unless otherwise documented below in the visit note. 

## 2016-03-03 NOTE — Assessment & Plan Note (Signed)
S: controlled on Amlodipine 10mg - wife wants to try to reduce volume/dosages of meds as able A/P: trial 5mg  02/2016

## 2016-03-03 NOTE — Assessment & Plan Note (Signed)
S: diagnosed 2010 and does not appear to have had recurrence. Wife stopped aspirin A/P: we discussed with no bleeding history but did have a fib history to restart aspirin 81mg  though no evidence of recurrence.

## 2016-03-03 NOTE — Assessment & Plan Note (Signed)
S: hopeful controlled. On amaryl 4mg  CBGs- last visit cbgs fasting 110-120 from - since that time has stopped checking Lab Results  Component Value Date   HGBA1C 10.3 10/27/2015   HGBA1C 7.3 (H) 07/01/2014   HGBA1C 6.6 (H) 12/08/2013   A/P: update a1c today- goal 8 or less with his dementia. If GFR above 30 and a1c above 8, consider low dose metformin such as 250 or 500mg  daily

## 2016-03-03 NOTE — Assessment & Plan Note (Signed)
S: GFR has hovered around 40, avoids nsaids A/P: update gfr/creatinine today- hopeful for stability- no microalbuminuria so holding off on ace-i

## 2016-03-03 NOTE — Patient Instructions (Signed)
Flu shot given  Reduce amlodipine to 5mg - sent in new prescription  Labs before you leave- hopeful diabetes controlled below 8

## 2016-03-03 NOTE — Assessment & Plan Note (Signed)
S: stable memory loss on aricept 5 mg BID A/P: consider MMse next visit (he was oriented to person, month but not oriented to year, location).

## 2016-03-03 NOTE — Assessment & Plan Note (Signed)
S: controlled in past on levothyroxine 75 mcg A/P: update TSH today with labs

## 2016-03-03 NOTE — Progress Notes (Signed)
Subjective:  Robert Salinas. is a 82 y.o. year old very pleasant male patient who presents for/with See problem oriented charting ROS- no reports of chest pain or shortness of breath. No headache or blurry vision. see any ROS included in HPI as well.   Past Medical History-  Patient Active Problem List   Diagnosis Date Noted  . DNR (do not resuscitate) 12/08/2013    Priority: High  . CKD (chronic kidney disease), stage III 02/21/2011    Priority: High  . Diabetes mellitus type II, controlled ([place]) 06/09/2009    Priority: High  . ALZHEIMER'S DISEASE, EARLY 06/09/2009    Priority: High  . Atrial fibrillation ([place]) 06/09/2009    Priority: High  . Hypothyroid 10/09/2011    Priority: Medium  . Hyperlipidemia 06/09/2009    Priority: Medium  . Essential hypertension 06/09/2009    Priority: Medium    Medications- reviewed and updated Current Outpatient Prescriptions  Medication Sig Dispense Refill  . atorvastatin (LIPITOR) 10 MG tablet TAKE 1 TABLET DAILY 90 tablet 2  . donepezil (ARICEPT) 10 MG tablet TAKE 1 TABLET AT BEDTIME 90 tablet 3  . gabapentin (NEURONTIN) 100 MG capsule Take 1 capsule (100 mg total) by mouth 3 (three) times daily. 270 capsule 2  . glimepiride (AMARYL) 4 MG tablet Take 1 tablet (4 mg total) by mouth daily before breakfast. 30 tablet 5  . glucose blood (ONETOUCH VERIO) test strip Use to test blood sugars daily. Dx: E11.9 100 each 12  . Lancets (ONETOUCH ULTRASOFT) lancets Use to test blood sugars daily. Dx: E11.9 100 each 12  . levothyroxine (SYNTHROID, LEVOTHROID) 75 MCG tablet TAKE 1 TABLET DAILY BEFORE BREAKFAST 90 tablet 3  . amLODipine (NORVASC) 5 MG tablet Take 1 tablet (5 mg total) by mouth daily. 90 tablet 3  . aspirin 81 MG tablet Take 1 tablet (81 mg total) by mouth daily. 1 tablet    No current facility-administered medications for this visit.     Objective: BP 104/62 (BP Location: Left Arm, Patient Position: Sitting, Cuff Size: Large)    Pulse 72   Temp 97.7 F (36.5 C) (Oral)   Wt 201 lb 12.8 oz (91.5 kg)   SpO2 91%   BMI 28.55 kg/m  Gen: NAD, resting comfortably CV: RRR no murmurs rubs or gallops Lungs: CTAB no crackles, wheeze, rhonchi Abdomen: soft/nontender/nondistended/normal bowel sounds.  Ext: no edema Skin: warm, dry Neuro: grossly normal, moves all extremities, able to remain engaged in conversation for most part  Assessment/Plan:  Diabetes mellitus type II, controlled S: hopeful controlled. On amaryl 4mg  CBGs- last visit cbgs fasting 110-120 from - since that time has stopped checking Lab Results  Component Value Date   HGBA1C 10.3 10/27/2015   HGBA1C 7.3 (H) 07/01/2014   HGBA1C 6.6 (H) 12/08/2013   A/P: update a1c today- goal 8 or less with his dementia. If GFR above 30 and a1c above 8, consider low dose metformin such as 250 or 500mg  daily  Atrial fibrillation S: diagnosed 2010 and does not appear to have had recurrence. Wife stopped aspirin A/P: we discussed with no bleeding history but did have a fib history to restart aspirin 81mg  though no evidence of recurrence.    ALZHEIMER'S DISEASE, EARLY S: stable memory loss on aricept 5 mg BID A/P: consider MMse next visit (he was oriented to person, month but not oriented to year, location).    CKD (chronic kidney disease), stage III S: GFR has hovered around 40, avoids nsaids A/P:  update gfr/creatinine today- hopeful for stability- no microalbuminuria so holding off on ace-i   Hypothyroid S: controlled in past on levothyroxine 75 mcg A/P: update TSH today with labs   Essential hypertension S: controlled on Amlodipine 10mg - wife wants to try to reduce volume/dosages of meds as able A/P: trial 5mg  02/2016  States eye exam scheduled later this year, pneumonia shot next visit- prevnar 13   Return in about 4 months (around 07/03/2016).  Orders Placed This Encounter  Procedures  . Flu vaccine HIGH DOSE PF  . CBC    [place]  . Comprehensive  metabolic panel    [place]  . LDL cholesterol, direct    [place]  . Hemoglobin A1c    [place]  . TSH    [place]    Meds ordered this encounter  Medications  . aspirin 81 MG tablet    Sig: Take 1 tablet (81 mg total) by mouth daily.    Dispense:  1 tablet  . amLODipine (NORVASC) 5 MG tablet    Sig: Take 1 tablet (5 mg total) by mouth daily.    Dispense:  90 tablet    Refill:  3    Return precautions advised.  [name], MD

## 2016-03-11 ENCOUNTER — Other Ambulatory Visit: Payer: Self-pay | Admitting: Family Medicine

## 2016-04-04 LAB — HM DIABETES EYE EXAM

## 2016-04-11 ENCOUNTER — Encounter: Payer: Self-pay | Admitting: Family Medicine

## 2016-04-12 ENCOUNTER — Encounter: Payer: Self-pay | Admitting: Podiatry

## 2016-04-12 ENCOUNTER — Ambulatory Visit (INDEPENDENT_AMBULATORY_CARE_PROVIDER_SITE_OTHER): Payer: Medicare Other | Admitting: Podiatry

## 2016-04-12 ENCOUNTER — Other Ambulatory Visit: Payer: Self-pay | Admitting: Family Medicine

## 2016-04-12 VITALS — Ht 70.5 in | Wt 201.0 lb

## 2016-04-12 DIAGNOSIS — B351 Tinea unguium: Secondary | ICD-10-CM

## 2016-04-12 DIAGNOSIS — E119 Type 2 diabetes mellitus without complications: Secondary | ICD-10-CM

## 2016-04-12 DIAGNOSIS — M79676 Pain in unspecified toe(s): Secondary | ICD-10-CM | POA: Diagnosis not present

## 2016-04-12 NOTE — Progress Notes (Signed)
Patient ID: Robert Salinas., male   DOB: 04/20/1927, 82 y.o.   MRN: [id number] Complaint:  Visit Type: Patient returns to my office for continued preventative foot care services. Complaint: Patient states" my nails have grown long and thick and become painful to walk and wear shoes" Patient has been diagnosed with DM with no foot complications. The patient presents for preventative foot care services. No changes to ROS  Podiatric Exam: Vascular: dorsalis pedis and posterior tibial pulses are palpable bilateral. Capillary return is immediate. Temperature gradient is WNL. Skin turgor WNL  Sensorium: Normal Semmes Weinstein monofilament test. Normal tactile sensation bilaterally. Nail Exam: Pt has thick disfigured discolored nails with subungual debris noted bilateral entire nail hallux through fifth toenails Ulcer Exam: There is no evidence of ulcer or pre-ulcerative changes or infection. Orthopedic Exam: Muscle tone and strength are WNL. No limitations in general ROM. No crepitus or effusions noted. Foot type and digits show no abnormalities. Bony prominences are unremarkable. Skin: No Porokeratosis. No infection or ulcers  Diagnosis:  Onychomycosis, , Pain in right toe, pain in left toes  Treatment & Plan Procedures and Treatment: Consent by patient was obtained for treatment procedures. The patient understood the discussion of treatment and procedures well. All questions were answered thoroughly reviewed. Debridement of mycotic and hypertrophic toenails, 1 through 5 bilateral and clearing of subungual debris. No ulceration, no infection noted.  Return Visit-Office Procedure: Patient instructed to return to the office for a follow up visit 3 months for continued evaluation and treatment.    Gardiner Barefoot DPM

## 2016-04-13 ENCOUNTER — Other Ambulatory Visit: Payer: Self-pay

## 2016-04-13 MED ORDER — GLIMEPIRIDE 4 MG PO TABS: 4.0000 mg | ORAL_TABLET | Freq: Every day | ORAL | 3 refills | Status: DC

## 2016-04-13 MED ORDER — AMLODIPINE BESYLATE 5 MG PO TABS: 5.0000 mg | ORAL_TABLET | Freq: Every day | ORAL | 3 refills | Status: DC

## 2016-07-12 ENCOUNTER — Ambulatory Visit: Payer: Medicare Other | Admitting: Podiatry

## 2017-02-21 ENCOUNTER — Encounter (HOSPITAL_COMMUNITY): Payer: Self-pay | Admitting: *Deleted

## 2017-02-21 ENCOUNTER — Emergency Department (HOSPITAL_COMMUNITY): Payer: Medicare Other

## 2017-02-21 ENCOUNTER — Emergency Department (HOSPITAL_COMMUNITY)
Admission: EM | Admit: 2017-02-21 | Discharge: 2017-02-21 | Disposition: A | Discharge status: Home / Self Care | Payer: Medicare Other | Attending: Emergency Medicine | Admitting: Emergency Medicine

## 2017-02-21 DIAGNOSIS — Z79899 Other long term (current) drug therapy: Secondary | ICD-10-CM | POA: Diagnosis not present

## 2017-02-21 DIAGNOSIS — Z7982 Long term (current) use of aspirin: Secondary | ICD-10-CM | POA: Diagnosis not present

## 2017-02-21 DIAGNOSIS — N390 Urinary tract infection, site not specified: Secondary | ICD-10-CM | POA: Diagnosis not present

## 2017-02-21 DIAGNOSIS — R4182 Altered mental status, unspecified: Secondary | ICD-10-CM | POA: Diagnosis not present

## 2017-02-21 DIAGNOSIS — E119 Type 2 diabetes mellitus without complications: Secondary | ICD-10-CM | POA: Diagnosis not present

## 2017-02-21 DIAGNOSIS — I10 Essential (primary) hypertension: Secondary | ICD-10-CM | POA: Insufficient documentation

## 2017-02-21 DIAGNOSIS — Z7984 Long term (current) use of oral hypoglycemic drugs: Secondary | ICD-10-CM | POA: Insufficient documentation

## 2017-02-21 DIAGNOSIS — E039 Hypothyroidism, unspecified: Secondary | ICD-10-CM | POA: Diagnosis not present

## 2017-02-21 DIAGNOSIS — G309 Alzheimer's disease, unspecified: Secondary | ICD-10-CM | POA: Diagnosis not present

## 2017-02-21 LAB — COMPREHENSIVE METABOLIC PANEL
ALK PHOS: 87 U/L (ref 38–126)
ALT: 21 U/L (ref 17–63)
AST: 27 U/L (ref 15–41)
Albumin: 3.9 g/dL (ref 3.5–5.0)
Anion gap: 11 (ref 5–15)
BILIRUBIN TOTAL: 1 mg/dL (ref 0.3–1.2)
BUN: 39 mg/dL — ABNORMAL HIGH (ref 6–20)
CALCIUM: 9.1 mg/dL (ref 8.9–10.3)
CO2: 23 mmol/L (ref 22–32)
CREATININE: 1.94 mg/dL — AB (ref 0.61–1.24)
Chloride: 106 mmol/L (ref 101–111)
GFR calc non Af Amer: 29 mL/min — ABNORMAL LOW (ref >60)
GFR, EST AFRICAN AMERICAN: 34 mL/min — AB (ref >60)
Glucose, Bld: 232 mg/dL — ABNORMAL HIGH (ref 65–99)
Potassium: 4.8 mmol/L (ref 3.5–5.1)
SODIUM: 140 mmol/L (ref 135–145)
Total Protein: 6.7 g/dL (ref 6.5–8.1)

## 2017-02-21 LAB — URINALYSIS, ROUTINE W REFLEX MICROSCOPIC
Bilirubin Urine: NEGATIVE
Glucose, UA: 50 mg/dL — AB
Ketones, ur: NEGATIVE mg/dL
Nitrite: NEGATIVE
PH: 6 (ref 5.0–8.0)
Protein, ur: 100 mg/dL — AB
SQUAMOUS EPITHELIAL / LPF: NONE SEEN
Specific Gravity, Urine: 1.015 (ref 1.005–1.030)

## 2017-02-21 LAB — CBC
HCT: 49.6 % (ref 39.0–52.0)
Hemoglobin: 16.1 g/dL (ref 13.0–17.0)
MCH: 31.6 pg (ref 26.0–34.0)
MCHC: 32.5 g/dL (ref 30.0–36.0)
MCV: 97.4 fL (ref 78.0–100.0)
PLATELETS: 135 10*3/uL — AB (ref 150–400)
RBC: 5.09 MIL/uL (ref 4.22–5.81)
RDW: 13.9 % (ref 11.5–15.5)
WBC: 6.6 10*3/uL (ref 4.0–10.5)

## 2017-02-21 LAB — CBG MONITORING, ED: GLUCOSE-CAPILLARY: 137 mg/dL — AB (ref 65–99)

## 2017-02-21 MED ORDER — SODIUM CHLORIDE 0.9 % IV BOLUS (SEPSIS)
500.0000 mL | Freq: Once | INTRAVENOUS | Status: AC
  Administered 2017-02-21: 500 mL via INTRAVENOUS

## 2017-02-21 MED ORDER — CEPHALEXIN 500 MG PO CAPS: 500.0000 mg | ORAL_CAPSULE | Freq: Four times a day (QID) | ORAL | 0 refills | Status: DC

## 2017-02-21 MED ORDER — CEPHALEXIN 250 MG PO CAPS
500.0000 mg | ORAL_CAPSULE | Freq: Once | ORAL | Status: AC
  Administered 2017-02-21: 500 mg via ORAL
  Filled 2017-02-21: qty 2

## 2017-02-21 NOTE — Clinical Social Work Note (Addendum)
CSW spoke with pt's daughter at length via telephone. Pt's daughter is concerned because pt's mental health has declined so rapidly. Pt's daughter is concerned for pt's safety. Pt's daughter states she believes her step mother has been giving pt Tylenol PM in order to keep him drowsy. Pt's daughter wants pt to evaluated by psych because she believes he needs behavioral health placement. Pt does have history of dementia.At this time we have no paperwork that the son or daughter are HCPOA. Therefore, the decision making would fall to the step mother.  CSW spoke with pt's spouse. Pt's spouse reports she can not care for pt any longer. Pt's spouse reports pt wanders outside of the house in the middle of the night. Pt's spouse reports she is disabled and caring for the pt has become too much. Pt's spouse reports pt needs placement. Pt's son at bedside. CSW will provide pt's son with a ALF list and explore the families options regarding pt care.   [place], [place]

## 2017-02-21 NOTE — ED Triage Notes (Signed)
Per family, pt has hx of dementia and having increase in confusion for extended amount of time. They have concerns about pt living at home with his wife, has recently been wandering and lost. No acute distress noted at triage and pt has no complaints.

## 2017-02-21 NOTE — ED Notes (Signed)
Patient transported to X-ray 

## 2017-02-21 NOTE — ED Provider Notes (Signed)
[facility] DEPT Provider Note   CSN: [id number] Arrival date & time: 02/21/17  1019     History   Chief Complaint Chief Complaint  Patient presents with  . Altered Mental Status    HPI Robert Salinas. is a 83 y.o. male.  Level V caveat for dementia. He has been more confused the past few days. [place] and had no explanation for this behavior. He lives at home with his wife was also having trouble with activities of daily living. No fever, sweats, chills, chest pain, dyspnea, dysuria.      Past Medical History:  Diagnosis Date  . Atrial fibrillation ([place])   . BPH (benign prostatic hyperplasia)   . Chickenpox   . COLONIC POLYPS, HX OF 06/09/2009  . Dementia   . Diabetes mellitus   . Hx of colonic polyps   . Hyperlipidemia   . Hypertension   . Hypothyroidism   . Thrombocytopenia [facility])     Patient Active Problem List   Diagnosis Date Noted  . DNR (do not resuscitate) 12/08/2013  . Hypothyroid 10/09/2011  . CKD (chronic kidney disease), stage III 02/21/2011  . Diabetes mellitus type II, controlled ([place]) 06/09/2009  . Hyperlipidemia 06/09/2009  . ALZHEIMER'S DISEASE, EARLY 06/09/2009  . Essential hypertension 06/09/2009  . Atrial fibrillation ([place]) 06/09/2009    Past Surgical History:  Procedure Laterality Date  . APPENDECTOMY    . PROSTATECTOMY     BPH cause?       Home Medications    Prior to Admission medications   Medication Sig Start Date End Date Taking? Authorizing Provider  amLODipine (NORVASC) 5 MG tablet Take 1 tablet (5 mg total) by mouth daily. 04/13/16   [name], MD  aspirin 81 MG tablet Take 1 tablet (81 mg total) by mouth daily. 03/03/16   [name], MD  atorvastatin (LIPITOR) 10 MG tablet TAKE 1 TABLET DAILY 08/13/15   [name], MD  cephALEXin (KEFLEX) 500 MG capsule Take 1 capsule (500 mg total) by mouth 4 (four) times daily. 02/21/17   Nat Christen, MD  donepezil  (ARICEPT) 10 MG tablet TAKE 1 TABLET AT BEDTIME 03/13/16   [name], MD  gabapentin (NEURONTIN) 100 MG capsule Take 1 capsule (100 mg total) by mouth 3 (three) times daily. 09/17/15   [name], MD  glimepiride (AMARYL) 4 MG tablet Take 1 tablet (4 mg total) by mouth daily with breakfast. 04/13/16   [name], MD  glucose blood (ONETOUCH VERIO) test strip Use to test blood sugars daily. Dx: E11.9 10/27/15   [name], MD  Lancets [facility] ULTRASOFT) lancets Use to test blood sugars daily. Dx: E11.9 10/27/15   [name], MD  levothyroxine (SYNTHROID, LEVOTHROID) 75 MCG tablet TAKE 1 TABLET DAILY BEFORE BREAKFAST 06/15/15   [name], MD    Family History History reviewed. No pertinent family history.  Social History Social History  Substance Use Topics  . Smoking status: Never Smoker  . Smokeless tobacco: Never Used  . Alcohol use No     Allergies   Patient has no known allergies.   Review of Systems Review of Systems  Unable to perform ROS: Dementia     Physical Exam Updated Vital Signs BP 97/76   Pulse 76   Temp 97.6 F (36.4 C) (Oral)   Resp 16   SpO2 95%   Physical Exam  Constitutional:  Pleasant, demented  HENT:  Head: Normocephalic and atraumatic.  Eyes: Conjunctivae are normal.  Neck: Neck supple.  Cardiovascular: Normal rate and regular rhythm.   Pulmonary/Chest: Effort normal and breath sounds normal.  Abdominal: Soft. Bowel sounds are normal.  Musculoskeletal: Normal range of motion.  Neurological: He is alert.  Skin: Skin is warm and dry.  Psychiatric:  Flat affect  Nursing note and vitals reviewed.    ED Treatments / Results  Labs (all labs ordered are listed, but only abnormal results are displayed) Labs Reviewed  COMPREHENSIVE METABOLIC PANEL - Abnormal; Notable for the following:       Result Value   Glucose, Bld 232 (*)    BUN 39 (*)    Creatinine, Ser 1.94 (*)    GFR calc non Af Amer 29  (*)    GFR calc Af Amer 34 (*)    All other components within normal limits  CBC - Abnormal; Notable for the following:    Platelets 135 (*)    All other components within normal limits  URINALYSIS, ROUTINE W REFLEX MICROSCOPIC - Abnormal; Notable for the following:    APPearance CLOUDY (*)    Glucose, UA 50 (*)    Hgb urine dipstick SMALL (*)    Protein, ur 100 (*)    Leukocytes, UA LARGE (*)    Bacteria, UA FEW (*)    All other components within normal limits  CBG MONITORING, ED - Abnormal; Notable for the following:    Glucose-Capillary 137 (*)    All other components within normal limits  URINE CULTURE    EKG  EKG Interpretation  Date/Time:  Wednesday February 21 2017 15:55:07 EDT Ventricular Rate:  66 PR Interval:    QRS Duration: 82 QT Interval:  405 QTC Calculation: 425 R Axis:   7 Text Interpretation:  Sinus rhythm Atrial premature complexes Prolonged PR interval Low voltage, precordial leads Confirmed by Nat Christen [phone]) on 02/21/2017 4:32:01 PM       Radiology Dg Chest 2 View  Result Date: 02/21/2017 CLINICAL DATA:  Dizziness and confusion beginning today. Atrial fibrillation. EXAM: CHEST  2 VIEW COMPARISON:  12/25/2008 FINDINGS: Low lung volumes again noted. Heart size remains stable. Both lungs are clear. IMPRESSION: Low lung volumes.  No active disease. Electronically Signed   By: Earle Gell M.D.   On: 02/21/2017 13:46   Ct Head Wo Contrast  Result Date: 02/21/2017 CLINICAL DATA:  Altered level of consciousness. EXAM: CT HEAD WITHOUT CONTRAST TECHNIQUE: Contiguous axial images were obtained from the base of the skull through the vertex without intravenous contrast. COMPARISON:  CT head 05/15/2015 FINDINGS: Brain: Moderate to advanced atrophy similar to the prior study. Extensive chronic microvascular ischemia throughout the white matter unchanged. Negative for acute infarct, hemorrhage, or mass. Vascular: Negative for hyperdense vessel Skull: Negative  Sinuses/Orbits: Bilateral cataract removal.  Paranasal sinuses clear Other: None IMPRESSION: Extensive atrophy and chronic microvascular ischemia. No acute abnormality. Electronically Signed   By: Franchot Gallo M.D.   On: 02/21/2017 13:55    Procedures Procedures (including critical care time)  Medications Ordered in ED Medications  sodium chloride 0.9 % bolus 500 mL (0 mLs Intravenous Stopped 02/21/17 1534)  cephALEXin (KEFLEX) capsule 500 mg (500 mg Oral Given 02/21/17 1641)     Initial Impression / Assessment and Plan / ED Course  I have reviewed the triage vital signs and the nursing notes.  Pertinent labs & imaging results that were available during my care of the patient were reviewed by  me and considered in my medical decision making (see chart for details).     Patient has a known history of dementia. He appears to be getting worse. Social work consult was obtained to assist the family with home health. CT head negative for acute findings. Additionally, it appears that he has a urinary tract infection. Will culture the urine and Rx cephalexin.  Final Clinical Impressions(s) / ED Diagnoses   Final diagnoses:  Altered mental status, unspecified altered mental status type  Urinary tract infection without hematuria, site unspecified    New Prescriptions New Prescriptions   CEPHALEXIN (KEFLEX) 500 MG CAPSULE    Take 1 capsule (500 mg total) by mouth 4 (four) times daily.     Nat Christen, MD 02/24/17 [phone]

## 2017-02-21 NOTE — Discharge Instructions (Signed)
Mr. Mobley has a urinary infection. Increase fluids. Prescription for antibiotic. Follow-up your primary care doctor.

## 2017-02-21 NOTE — ED Provider Notes (Addendum)
[facility] DEPT Provider Note   CSN: [id number] Arrival date & time: 02/21/17  1019     History   Chief Complaint Chief Complaint  Patient presents with  . Altered Mental Status    HPI Robert Julia. is a 83 y.o. male.  Level V caveat for dementia. Most of history obtained from his 2 sons. Patient lives at home with his wife. He has a known diagnosis of Alzheimer's dementia. [place]. Family is concerned about worsening confusion, disorientation, ability to take care of himself. His wife is elderly and has her own set of medical issues. No specific complaints of chest pain, dyspnea, dysuria, fever, sweats, chills.      Past Medical History:  Diagnosis Date  . Atrial fibrillation ([place])   . BPH (benign prostatic hyperplasia)   . Chickenpox   . COLONIC POLYPS, HX OF 06/09/2009  . Dementia   . Diabetes mellitus   . Hx of colonic polyps   . Hyperlipidemia   . Hypertension   . Hypothyroidism   . Thrombocytopenia [facility])     Patient Active Problem List   Diagnosis Date Noted  . DNR (do not resuscitate) 12/08/2013  . Hypothyroid 10/09/2011  . CKD (chronic kidney disease), stage III 02/21/2011  . Diabetes mellitus type II, controlled ([place]) 06/09/2009  . Hyperlipidemia 06/09/2009  . ALZHEIMER'S DISEASE, EARLY 06/09/2009  . Essential hypertension 06/09/2009  . Atrial fibrillation ([place]) 06/09/2009    Past Surgical History:  Procedure Laterality Date  . APPENDECTOMY    . PROSTATECTOMY     BPH cause?       Home Medications    Prior to Admission medications   Medication Sig Start Date End Date Taking? Authorizing Provider  amLODipine (NORVASC) 5 MG tablet Take 1 tablet (5 mg total) by mouth daily. 04/13/16   [name], MD  aspirin 81 MG tablet Take 1 tablet (81 mg total) by mouth daily. 03/03/16   [name], MD  atorvastatin (LIPITOR) 10 MG tablet TAKE 1 TABLET DAILY 08/13/15   [name], MD   cephALEXin (KEFLEX) 500 MG capsule Take 1 capsule (500 mg total) by mouth 4 (four) times daily. 02/21/17   Nat Christen, MD  donepezil (ARICEPT) 10 MG tablet TAKE 1 TABLET AT BEDTIME 03/13/16   [name], MD  gabapentin (NEURONTIN) 100 MG capsule Take 1 capsule (100 mg total) by mouth 3 (three) times daily. 09/17/15   [name], MD  glimepiride (AMARYL) 4 MG tablet Take 1 tablet (4 mg total) by mouth daily with breakfast. 04/13/16   [name], MD  glucose blood (ONETOUCH VERIO) test strip Use to test blood sugars daily. Dx: E11.9 10/27/15   [name], MD  Lancets [facility] ULTRASOFT) lancets Use to test blood sugars daily. Dx: E11.9 10/27/15   [name], MD  levothyroxine (SYNTHROID, LEVOTHROID) 75 MCG tablet TAKE 1 TABLET DAILY BEFORE BREAKFAST 06/15/15   [name], MD    Family History History reviewed. No pertinent family history.  Social History Social History  Substance Use Topics  . Smoking status: Never Smoker  . Smokeless tobacco: Never Used  . Alcohol use No     Allergies   Patient has no known allergies.   Review of Systems Review of Systems  Unable to perform ROS: Dementia     Physical Exam Updated Vital Signs BP 97/76   Pulse 76   Temp 97.6 F (  36.4 C) (Oral)   Resp 16   SpO2 95%   Physical Exam  Constitutional:  Pleasant, no acute distress  HENT:  Head: Normocephalic and atraumatic.  Eyes: Conjunctivae are normal.  Neck: Neck supple.  Cardiovascular: Normal rate and regular rhythm.   Pulmonary/Chest: Effort normal and breath sounds normal.  Abdominal: Soft. Bowel sounds are normal.  Musculoskeletal: Normal range of motion.  Neurological: He is alert.  Skin: Skin is warm and dry.  Psychiatric:  Flat affect  Nursing note and vitals reviewed.    ED Treatments / Results  Labs (all labs ordered are listed, but only abnormal results are displayed) Labs Reviewed  COMPREHENSIVE METABOLIC PANEL - Abnormal;  Notable for the following:       Result Value   Glucose, Bld 232 (*)    BUN 39 (*)    Creatinine, Ser 1.94 (*)    GFR calc non Af Amer 29 (*)    GFR calc Af Amer 34 (*)    All other components within normal limits  CBC - Abnormal; Notable for the following:    Platelets 135 (*)    All other components within normal limits  URINALYSIS, ROUTINE W REFLEX MICROSCOPIC - Abnormal; Notable for the following:    APPearance CLOUDY (*)    Glucose, UA 50 (*)    Hgb urine dipstick SMALL (*)    Protein, ur 100 (*)    Leukocytes, UA LARGE (*)    Bacteria, UA FEW (*)    All other components within normal limits  CBG MONITORING, ED - Abnormal; Notable for the following:    Glucose-Capillary 137 (*)    All other components within normal limits  URINE CULTURE    EKG  EKG Interpretation  Date/Time:  Wednesday February 21 2017 15:55:07 EDT Ventricular Rate:  66 PR Interval:    QRS Duration: 82 QT Interval:  405 QTC Calculation: 425 R Axis:   7 Text Interpretation:  Sinus rhythm Atrial premature complexes Prolonged PR interval Low voltage, precordial leads Confirmed by Nat Christen [phone]) on 02/21/2017 4:32:01 PM       Radiology Dg Chest 2 View  Result Date: 02/21/2017 CLINICAL DATA:  Dizziness and confusion beginning today. Atrial fibrillation. EXAM: CHEST  2 VIEW COMPARISON:  12/25/2008 FINDINGS: Low lung volumes again noted. Heart size remains stable. Both lungs are clear. IMPRESSION: Low lung volumes.  No active disease. Electronically Signed   By: Earle Gell M.D.   On: 02/21/2017 13:46   Ct Head Wo Contrast  Result Date: 02/21/2017 CLINICAL DATA:  Altered level of consciousness. EXAM: CT HEAD WITHOUT CONTRAST TECHNIQUE: Contiguous axial images were obtained from the base of the skull through the vertex without intravenous contrast. COMPARISON:  CT head 05/15/2015 FINDINGS: Brain: Moderate to advanced atrophy similar to the prior study. Extensive chronic microvascular ischemia throughout  the white matter unchanged. Negative for acute infarct, hemorrhage, or mass. Vascular: Negative for hyperdense vessel Skull: Negative Sinuses/Orbits: Bilateral cataract removal.  Paranasal sinuses clear Other: None IMPRESSION: Extensive atrophy and chronic microvascular ischemia. No acute abnormality. Electronically Signed   By: Franchot Gallo M.D.   On: 02/21/2017 13:55    Procedures Procedures (including critical care time)  Medications Ordered in ED Medications  cephALEXin (KEFLEX) capsule 500 mg (not administered)  sodium chloride 0.9 % bolus 500 mL (0 mLs Intravenous Stopped 02/21/17 1534)     Initial Impression / Assessment and Plan / ED Course  I have reviewed the triage vital signs and the nursing  notes.  Pertinent labs & imaging results that were available during my care of the patient were reviewed by me and considered in my medical decision making (see chart for details).     Patient is nontoxic-appearing. I suspect that his Alzheimer's dementia is creating care issues at home. Social work consult obtained for home health. Additionally, he has a urinary infection. Will Rx Keflex 500 mg 4 times a day and culture urine. All his tests were discussed with the patient and his 2 sons.  Final Clinical Impressions(s) / ED Diagnoses   Final diagnoses:  Altered mental status, unspecified altered mental status type  Urinary tract infection without hematuria, site unspecified    New Prescriptions New Prescriptions   CEPHALEXIN (KEFLEX) 500 MG CAPSULE    Take 1 capsule (500 mg total) by mouth 4 (four) times daily.     Nat Christen, MD 02/21/17 1631    Nat Christen, MD 02/21/17 [phone]

## 2017-02-28 LAB — SUSCEPTIBILITY, AER + ANAEROB

## 2017-02-28 LAB — SUSCEPTIBILITY RESULT

## 2017-03-03 LAB — URINE CULTURE

## 2017-03-04 ENCOUNTER — Telehealth: Payer: Self-pay | Admitting: Emergency Medicine

## 2017-03-04 NOTE — Telephone Encounter (Signed)
Post ED Visit - Positive Culture Follow-up: Successful Patient Follow-Up  Culture assessed and recommendations reviewed by: []  Elenor Quinones, Pharm.D. []  Heide Guile, Pharm.D., BCPS AQ-ID []  Parks Neptune, Pharm.D., BCPS []  Alycia Rossetti, Pharm.D., BCPS []  [place], Pharm.D., BCPS, AAHIVP []  Legrand Como, Pharm.D., BCPS, AAHIVP []  Salome Arnt, PharmD, BCPS []  Dimitri Ped, PharmD, BCPS []  Vincenza Hews, PharmD, BCPS Jimmy Footman PharmD  Positive urine culture  []  Patient discharged without antimicrobial prescription and treatment is now indicated []  Organism is resistant to prescribed ED discharge antimicrobial []  Patient with positive blood cultures  Changes discussed with ED provider: Janetta Hora PA New antibiotic prescription if urinary symptoms, start amoxicllin 500mg  po bid x 7 days no symptoms so no further treatment needed     Hazle Nordmann 03/04/2017, 1:46 PM

## 2017-03-04 NOTE — Progress Notes (Signed)
ED Antimicrobial Stewardship Positive Culture Follow Up   Robert Salinas. is an 83 y.o. male who presented to [facility] on 02/21/2017 with a chief complaint of  Chief Complaint  Patient presents with  . Altered Mental Status    Recent Results (from the past 720 hour(s))  Urine culture     Status: Abnormal   Collection Time: 02/21/17  3:32 PM  Result Value Ref Range Status   Specimen Description URINE, RANDOM  Final   Special Requests NONE  Final   Culture (A)  Final    >=100,000 COLONIES/mL ENTEROCOCCUS FAECALIS SEE SEPARATE REPORT FOR SUSCEPTIBILITY RESULTS Performed at [facility]    Report Status 03/03/2017 FINAL  Final  Susceptibility, Aer + Anaerob     Status: None   Collection Time: 02/21/17  3:32 PM  Result Value Ref Range Status   Suscept, Aer + Anaerob Final report  Corrected    Comment: (NOTE) Performed At: [facility] [address] [place], [place] [id number] Lindon Romp MD JO:[id number] CORRECTED ON 09/12 AT 1637: PREVIOUSLY REPORTED AS Preliminary report    Source of Sample URINE, RANDOM  Final    Comment: ENTEROCOCCUS FAECALIS  Susceptibility Result     Status: None   Collection Time: 02/21/17  3:32 PM  Result Value Ref Range Status   Suscept Result 1 Enterococcus faecalis  Final    Comment: (NOTE) Identification performed by account, not confirmed by this laboratory.    Antimicrobial Suscept Comment  Final    Comment: (NOTE)      ** S = Susceptible; I = Intermediate; R = Resistant **                   P = Positive; N = Negative            MICS are expressed in micrograms per mL   Antibiotic                 RSLT#1    RSLT#2    RSLT#3    RSLT#4 Penicillin                     S Vancomycin                     S Performed At: [facility] [address] [place], [place] [id number] Lindon Romp MD [id number]     [x]  Treated with cephalexin, organism resistant to prescribed antimicrobial []  Patient discharged  originally without antimicrobial agent and treatment is now indicated  New antibiotic prescription: Call patient  If symptoms, Amoxicillin 500 mg BID X 7 days If no symptoms, no antibiotic  ED Provider: Janetta Hora, PA-C  Susa Raring, PharmD 03/04/2017, 9:44 AM PGY2 Infectious Diseases Pharmacy Resident Pager: [phone]  Phone# [phone]

## 2017-03-07 ENCOUNTER — Ambulatory Visit: Payer: Medicare Other | Admitting: Family Medicine

## 2017-03-15 ENCOUNTER — Encounter: Payer: Self-pay | Admitting: Family Medicine

## 2017-03-15 ENCOUNTER — Ambulatory Visit (INDEPENDENT_AMBULATORY_CARE_PROVIDER_SITE_OTHER): Payer: Medicare Other | Admitting: Family Medicine

## 2017-03-15 VITALS — BP 100/64 | HR 72 | Temp 97.8°F | Ht 70.5 in | Wt 184.2 lb

## 2017-03-15 DIAGNOSIS — E119 Type 2 diabetes mellitus without complications: Secondary | ICD-10-CM | POA: Diagnosis not present

## 2017-03-15 DIAGNOSIS — L989 Disorder of the skin and subcutaneous tissue, unspecified: Secondary | ICD-10-CM | POA: Diagnosis not present

## 2017-03-15 DIAGNOSIS — Z23 Encounter for immunization: Secondary | ICD-10-CM

## 2017-03-15 DIAGNOSIS — F0280 Dementia in other diseases classified elsewhere without behavioral disturbance: Secondary | ICD-10-CM

## 2017-03-15 DIAGNOSIS — I10 Essential (primary) hypertension: Secondary | ICD-10-CM | POA: Diagnosis not present

## 2017-03-15 DIAGNOSIS — E034 Atrophy of thyroid (acquired): Secondary | ICD-10-CM

## 2017-03-15 DIAGNOSIS — G309 Alzheimer's disease, unspecified: Secondary | ICD-10-CM

## 2017-03-15 DIAGNOSIS — I4891 Unspecified atrial fibrillation: Secondary | ICD-10-CM

## 2017-03-15 LAB — COMPREHENSIVE METABOLIC PANEL
ALT: 25 U/L (ref 0–53)
AST: 23 U/L (ref 0–37)
Albumin: 4.2 g/dL (ref 3.5–5.2)
Alkaline Phosphatase: 93 U/L (ref 39–117)
BUN: 47 mg/dL — ABNORMAL HIGH (ref 6–23)
CALCIUM: 9.5 mg/dL (ref 8.4–10.5)
CHLORIDE: 104 meq/L (ref 96–112)
CO2: 29 meq/L (ref 19–32)
CREATININE: 1.82 mg/dL — AB (ref 0.40–1.50)
GFR: 37.55 mL/min — ABNORMAL LOW (ref >60.00)
Glucose, Bld: 267 mg/dL — ABNORMAL HIGH (ref 70–99)
POTASSIUM: 4.4 meq/L (ref 3.5–5.1)
Sodium: 143 mEq/L (ref 135–145)
Total Bilirubin: 1 mg/dL (ref 0.2–1.2)
Total Protein: 6.5 g/dL (ref 6.0–8.3)

## 2017-03-15 LAB — CBC
HEMATOCRIT: 49.7 % (ref 39.0–52.0)
Hemoglobin: 16.4 g/dL (ref 13.0–17.0)
MCHC: 33 g/dL (ref 30.0–36.0)
MCV: 98.5 fl (ref 78.0–100.0)
PLATELETS: 150 10*3/uL (ref 150.0–400.0)
RBC: 5.04 Mil/uL (ref 4.22–5.81)
RDW: 13.7 % (ref 11.5–15.5)
WBC: 9.1 10*3/uL (ref 4.0–10.5)

## 2017-03-15 LAB — LDL CHOLESTEROL, DIRECT: Direct LDL: 94 mg/dL

## 2017-03-15 LAB — HEMOGLOBIN A1C: HEMOGLOBIN A1C: 7.1 % — AB (ref 4.6–6.5)

## 2017-03-15 LAB — TSH: TSH: 6.38 u[IU]/mL — AB (ref 0.35–4.50)

## 2017-03-15 MED ORDER — AMLODIPINE BESYLATE 2.5 MG PO TABS: 2.5000 mg | ORAL_TABLET | Freq: Every day | ORAL | 3 refills | Status: AC

## 2017-03-15 NOTE — Assessment & Plan Note (Signed)
S:Patient has had 2 wondering episodes. Wife no longer able to care for him. Plan is for placement to [facility] memory unit potentially.  Labor day found out 2 [place] [address].  Then wife not aware that he was at [place] [address].   Wife worn out- having to do incontinence care. hes not quite himself- fair amount of agitation between the 2.   He has been on aricept 5mg  BID in the past.  A/P: will attempt to get him into memory care unit- do not think he qualifies for SNF- no obvious skilled need

## 2017-03-15 NOTE — Assessment & Plan Note (Signed)
S: well controlled. On last check on amaryl 4mg .  CBGs- does not check Lab Results  Component Value Date   HGBA1C 6.4 03/03/2016   HGBA1C 10.3 10/27/2015   HGBA1C 7.3 (H) 07/01/2014   A/P: update a1c today. Could consider metformin if GFR >30 and a1c over 8

## 2017-03-15 NOTE — Patient Instructions (Signed)
Please stop by lab before you go  Flu shot and prevnar 13 (final pneumonia shot today)  We will call you within a week or two about your referral to dermatology. If you do not hear within 3 weeks, give [place] a call.

## 2017-03-15 NOTE — Addendum Note (Signed)
Addended by: Mariam Dollar, Roselyn Reef M on: 03/15/2017 03:04 PM   Modules accepted: Orders

## 2017-03-15 NOTE — Progress Notes (Signed)
Subjective:  Robert Salinas. is a 83 y.o. year old very pleasant male patient who presents for/with See problem oriented charting ROS- no chest pain. No edema. Denies shortness of breath. Wanders at times per social work   Past Medical History-  Patient Active Problem List   Diagnosis Date Noted  . DNR (do not resuscitate) 12/08/2013    Priority: High  . CKD (chronic kidney disease), stage III 02/21/2011    Priority: High  . Diabetes mellitus type II, controlled (Robert Salinas) 06/09/2009    Priority: High  . ALZHEIMER'S DISEASE, EARLY 06/09/2009    Priority: High  . Atrial fibrillation ([place]) 06/09/2009    Priority: High  . Hypothyroid 10/09/2011    Priority: Medium  . Hyperlipidemia 06/09/2009    Priority: Medium  . Essential hypertension 06/09/2009    Priority: Medium    Medications- reviewed and updated Current Outpatient Prescriptions  Medication Sig Dispense Refill  . amLODipine (NORVASC) 2.5 MG tablet Take 1 tablet (2.5 mg total) by mouth daily. 90 tablet 3  . aspirin 81 MG tablet Take 1 tablet (81 mg total) by mouth daily. 1 tablet   . atorvastatin (LIPITOR) 10 MG tablet TAKE 1 TABLET DAILY 90 tablet 2  . donepezil (ARICEPT) 10 MG tablet TAKE 1 TABLET AT BEDTIME 90 tablet 3  . glimepiride (AMARYL) 4 MG tablet Take 1 tablet (4 mg total) by mouth daily with breakfast. 90 tablet 3  . glucose blood (ONETOUCH VERIO) test strip Use to test blood sugars daily. Dx: E11.9 100 each 12  . Lancets (ONETOUCH ULTRASOFT) lancets Use to test blood sugars daily. Dx: E11.9 100 each 12  . levothyroxine (SYNTHROID, LEVOTHROID) 75 MCG tablet TAKE 1 TABLET DAILY BEFORE BREAKFAST 90 tablet 3   No current facility-administered medications for this visit.     Objective: BP 100/64 (BP Location: Left Arm, Patient Position: Sitting, Cuff Size: Large)   Pulse 72   Temp 97.8 F (36.6 C) (Oral)   Ht 5' 10.5" (1.791 m)   Wt 184 lb 3.2 oz (83.6 kg)   SpO2 93%   BMI 26.06 kg/m  Gen: NAD, resting  comfortably CV: RRR no murmurs rubs or gallops Lungs: CTAB no crackles, wheeze, rhonchi Abdomen: soft/nontender/nondistended/normal bowel sounds.  Ext: no edema Skin: warm, dry Neuro: walks with walker  Assessment/Plan:  At visit with adult protective services  Skin cancer potential on left hand- refer to dermatology  Flu shot and prevnar 13 today.   Atrial fibrillation S: diagnosed atrial fibrillation in 2010 but apparently without recurrence. Has been maintained on aspirin in case recurrence A/P: appears to be in sinus rhythm today. Some easy bruising on aspirin- will maintain on this for now  Diabetes mellitus type II, controlled S: well controlled. On last check on amaryl 4mg .  CBGs- does not check Lab Results  Component Value Date   HGBA1C 6.4 03/03/2016   HGBA1C 10.3 10/27/2015   HGBA1C 7.3 (H) 07/01/2014   A/P: update a1c today. Could consider metformin if GFR >30 and a1c over 8  Essential hypertension S: controlled on amlodipine 5mg .  BP Readings from Last 3 Encounters:  03/15/17 100/64  02/21/17 97/76  03/03/16 104/62  A/P: We discussed blood pressure goal of <140/90. Continue current meds:  Reduce further to 2.5mg  amlodipine. Could consider ace-I but hesitant to make changes at present - unable to get urine microalbumin/creatinine ratio today  Hypothyroid S: controlled on levothyroxine 75 mcg Lab Results  Component Value Date   TSH 2.08 03/03/2016  A/P:need to update tsh. Refill appears slightly old- hopeful has been taking  ALZHEIMER'S DISEASE, EARLY S:Patient has had 2 wondering episodes. Wife no longer able to care for him. Plan is for placement to [facility] memory unit potentially.  Labor day found out 2 [place] [address].  Then wife not aware that he was at [place] [address].   Wife worn out- having to do incontinence care. hes not quite himself- fair amount of agitation between the 2.   He has been on aricept 5mg  BID  in the past.  A/P: will attempt to get him into memory care unit- do not think he qualifies for SNF- no obvious skilled need  4 months  Orders Placed This Encounter  Procedures  . CBC    [place]  . Comprehensive metabolic panel    [place]  . LDL cholesterol, direct    [place]  . Hemoglobin A1c    [place]  . TSH    [place]  . Ambulatory referral to Dermatology    Referral Priority:   Routine    Referral Type:   Consultation    Referral Reason:   Specialty Services Required    Requested Specialty:   Dermatology    Number of Visits Requested:   1    Meds ordered this encounter  Medications  . amLODipine (NORVASC) 2.5 MG tablet    Sig: Take 1 tablet (2.5 mg total) by mouth daily.    Dispense:  90 tablet    Refill:  3   Return precautions advised.  [name], MD

## 2017-03-15 NOTE — Assessment & Plan Note (Signed)
S: diagnosed atrial fibrillation in 2010 but apparently without recurrence. Has been maintained on aspirin in case recurrence A/P: appears to be in sinus rhythm today. Some easy bruising on aspirin- will maintain on this for now

## 2017-03-15 NOTE — Assessment & Plan Note (Signed)
S: controlled on amlodipine 5mg .  BP Readings from Last 3 Encounters:  03/15/17 100/64  02/21/17 97/76  03/03/16 104/62  A/P: We discussed blood pressure goal of <140/90. Continue current meds:  Reduce further to 2.5mg  amlodipine. Could consider ace-I but hesitant to make changes at present - unable to get urine microalbumin/creatinine ratio today

## 2017-03-15 NOTE — Assessment & Plan Note (Signed)
S: controlled on levothyroxine 75 mcg Lab Results  Component Value Date   TSH 2.08 03/03/2016  A/P:need to update tsh. Refill appears slightly old- hopeful has been taking

## 2017-03-19 NOTE — Progress Notes (Signed)
Spoke with patient's wife Lulu as he doesn't speak on the phone. She verbalized understanding of lab results. She states he does take his medicine every morning as prescribed

## 2017-03-20 ENCOUNTER — Emergency Department (HOSPITAL_COMMUNITY): Payer: Medicare Other

## 2017-03-20 ENCOUNTER — Observation Stay (HOSPITAL_COMMUNITY)
Admission: EM | Admit: 2017-03-20 | Discharge: 2017-03-26 | Disposition: A | Discharge status: Home / Self Care | Payer: Medicare Other | Attending: Family Medicine | Admitting: Family Medicine

## 2017-03-20 ENCOUNTER — Encounter (HOSPITAL_COMMUNITY): Payer: Self-pay | Admitting: *Deleted

## 2017-03-20 DIAGNOSIS — R2681 Unsteadiness on feet: Secondary | ICD-10-CM | POA: Diagnosis not present

## 2017-03-20 DIAGNOSIS — Z66 Do not resuscitate: Secondary | ICD-10-CM | POA: Diagnosis not present

## 2017-03-20 DIAGNOSIS — D696 Thrombocytopenia, unspecified: Secondary | ICD-10-CM | POA: Diagnosis not present

## 2017-03-20 DIAGNOSIS — G9341 Metabolic encephalopathy: Secondary | ICD-10-CM

## 2017-03-20 DIAGNOSIS — E785 Hyperlipidemia, unspecified: Secondary | ICD-10-CM | POA: Diagnosis not present

## 2017-03-20 DIAGNOSIS — Z9181 History of falling: Secondary | ICD-10-CM | POA: Insufficient documentation

## 2017-03-20 DIAGNOSIS — N3000 Acute cystitis without hematuria: Secondary | ICD-10-CM | POA: Diagnosis present

## 2017-03-20 DIAGNOSIS — N390 Urinary tract infection, site not specified: Secondary | ICD-10-CM | POA: Diagnosis not present

## 2017-03-20 DIAGNOSIS — N400 Enlarged prostate without lower urinary tract symptoms: Secondary | ICD-10-CM | POA: Diagnosis not present

## 2017-03-20 DIAGNOSIS — Z7982 Long term (current) use of aspirin: Secondary | ICD-10-CM | POA: Diagnosis not present

## 2017-03-20 DIAGNOSIS — N179 Acute kidney failure, unspecified: Secondary | ICD-10-CM | POA: Diagnosis not present

## 2017-03-20 DIAGNOSIS — G309 Alzheimer's disease, unspecified: Secondary | ICD-10-CM | POA: Insufficient documentation

## 2017-03-20 DIAGNOSIS — I129 Hypertensive chronic kidney disease with stage 1 through stage 4 chronic kidney disease, or unspecified chronic kidney disease: Secondary | ICD-10-CM | POA: Diagnosis not present

## 2017-03-20 DIAGNOSIS — N183 Chronic kidney disease, stage 3 (moderate): Secondary | ICD-10-CM | POA: Diagnosis not present

## 2017-03-20 DIAGNOSIS — F0280 Dementia in other diseases classified elsewhere without behavioral disturbance: Secondary | ICD-10-CM | POA: Insufficient documentation

## 2017-03-20 DIAGNOSIS — E039 Hypothyroidism, unspecified: Secondary | ICD-10-CM | POA: Insufficient documentation

## 2017-03-20 DIAGNOSIS — I480 Paroxysmal atrial fibrillation: Secondary | ICD-10-CM | POA: Insufficient documentation

## 2017-03-20 DIAGNOSIS — W19XXXA Unspecified fall, initial encounter: Secondary | ICD-10-CM

## 2017-03-20 DIAGNOSIS — E860 Dehydration: Secondary | ICD-10-CM | POA: Diagnosis not present

## 2017-03-20 DIAGNOSIS — E1122 Type 2 diabetes mellitus with diabetic chronic kidney disease: Secondary | ICD-10-CM | POA: Diagnosis not present

## 2017-03-20 DIAGNOSIS — M6281 Muscle weakness (generalized): Secondary | ICD-10-CM | POA: Diagnosis not present

## 2017-03-20 DIAGNOSIS — E119 Type 2 diabetes mellitus without complications: Secondary | ICD-10-CM

## 2017-03-20 LAB — URINALYSIS, ROUTINE W REFLEX MICROSCOPIC
BILIRUBIN URINE: NEGATIVE
Glucose, UA: >=500 mg/dL — AB
Hgb urine dipstick: NEGATIVE
KETONES UR: NEGATIVE mg/dL
Nitrite: NEGATIVE
PH: 5 (ref 5.0–8.0)
PROTEIN: 30 mg/dL — AB
SQUAMOUS EPITHELIAL / LPF: NONE SEEN
Specific Gravity, Urine: 1.013 (ref 1.005–1.030)

## 2017-03-20 LAB — CBG MONITORING, ED: GLUCOSE-CAPILLARY: 297 mg/dL — AB (ref 65–99)

## 2017-03-20 LAB — CBC WITH DIFFERENTIAL/PLATELET
BASOS PCT: 0 %
Basophils Absolute: 0 10*3/uL (ref 0.0–0.1)
Eosinophils Absolute: 0.1 10*3/uL (ref 0.0–0.7)
Eosinophils Relative: 1 %
HEMATOCRIT: 44.8 % (ref 39.0–52.0)
Hemoglobin: 15 g/dL (ref 13.0–17.0)
Lymphocytes Relative: 19 %
Lymphs Abs: 1.2 10*3/uL (ref 0.7–4.0)
MCH: 32 pg (ref 26.0–34.0)
MCHC: 33.5 g/dL (ref 30.0–36.0)
MCV: 95.5 fL (ref 78.0–100.0)
MONO ABS: 0.4 10*3/uL (ref 0.1–1.0)
MONOS PCT: 7 %
NEUTROS ABS: 4.8 10*3/uL (ref 1.7–7.7)
Neutrophils Relative %: 73 %
Platelets: 147 10*3/uL — ABNORMAL LOW (ref 150–400)
RBC: 4.69 MIL/uL (ref 4.22–5.81)
RDW: 13.6 % (ref 11.5–15.5)
WBC: 6.5 10*3/uL (ref 4.0–10.5)

## 2017-03-20 LAB — COMPREHENSIVE METABOLIC PANEL
ALBUMIN: 3.7 g/dL (ref 3.5–5.0)
ALT: 32 U/L (ref 17–63)
ANION GAP: 9 (ref 5–15)
AST: 47 U/L — ABNORMAL HIGH (ref 15–41)
Alkaline Phosphatase: 94 U/L (ref 38–126)
BILIRUBIN TOTAL: 1.3 mg/dL — AB (ref 0.3–1.2)
BUN: 49 mg/dL — ABNORMAL HIGH (ref 6–20)
CO2: 26 mmol/L (ref 22–32)
Calcium: 8.9 mg/dL (ref 8.9–10.3)
Chloride: 106 mmol/L (ref 101–111)
Creatinine, Ser: 1.85 mg/dL — ABNORMAL HIGH (ref 0.61–1.24)
GFR calc Af Amer: 36 mL/min — ABNORMAL LOW (ref >60)
GFR, EST NON AFRICAN AMERICAN: 31 mL/min — AB (ref >60)
Glucose, Bld: 304 mg/dL — ABNORMAL HIGH (ref 65–99)
POTASSIUM: 4.3 mmol/L (ref 3.5–5.1)
Sodium: 141 mmol/L (ref 135–145)
TOTAL PROTEIN: 6.5 g/dL (ref 6.5–8.1)

## 2017-03-20 LAB — PROTIME-INR
INR: 1.01
PROTHROMBIN TIME: 13.2 s (ref 11.4–15.2)

## 2017-03-20 LAB — GLUCOSE, CAPILLARY
GLUCOSE-CAPILLARY: 200 mg/dL — AB (ref 65–99)
GLUCOSE-CAPILLARY: 119 mg/dL — AB (ref 65–99)

## 2017-03-20 LAB — TSH: TSH: 5.424 u[IU]/mL — ABNORMAL HIGH (ref 0.350–4.500)

## 2017-03-20 LAB — LIPASE, BLOOD: LIPASE: 21 U/L (ref 11–51)

## 2017-03-20 LAB — T4, FREE: FREE T4: 1.03 ng/dL (ref 0.61–1.12)

## 2017-03-20 MED ORDER — HEPARIN SODIUM (PORCINE) 5000 UNIT/ML IJ SOLN
5000.0000 [IU] | Freq: Three times a day (TID) | INTRAMUSCULAR | Status: DC
  Administered 2017-03-20 – 2017-03-26 (×18): 5000 [IU] via SUBCUTANEOUS
  Filled 2017-03-20 (×17): qty 1

## 2017-03-20 MED ORDER — LEVOTHYROXINE SODIUM 50 MCG PO TABS
75.0000 ug | ORAL_TABLET | Freq: Every day | ORAL | Status: DC
  Administered 2017-03-21 – 2017-03-26 (×6): 75 ug via ORAL
  Filled 2017-03-20 (×6): qty 1

## 2017-03-20 MED ORDER — AMLODIPINE BESYLATE 5 MG PO TABS
2.5000 mg | ORAL_TABLET | Freq: Every day | ORAL | Status: DC
  Administered 2017-03-20 – 2017-03-26 (×7): 2.5 mg via ORAL
  Filled 2017-03-20 (×7): qty 1

## 2017-03-20 MED ORDER — DONEPEZIL HCL 10 MG PO TABS
10.0000 mg | ORAL_TABLET | Freq: Every day | ORAL | Status: DC
  Administered 2017-03-20 – 2017-03-25 (×6): 10 mg via ORAL
  Filled 2017-03-20 (×6): qty 1

## 2017-03-20 MED ORDER — ASPIRIN 81 MG PO CHEW
81.0000 mg | CHEWABLE_TABLET | Freq: Every day | ORAL | Status: DC
  Administered 2017-03-20 – 2017-03-26 (×7): 81 mg via ORAL
  Filled 2017-03-20 (×7): qty 1

## 2017-03-20 MED ORDER — ATORVASTATIN CALCIUM 10 MG PO TABS
10.0000 mg | ORAL_TABLET | Freq: Every day | ORAL | Status: DC
  Administered 2017-03-20 – 2017-03-26 (×7): 10 mg via ORAL
  Filled 2017-03-20 (×7): qty 1

## 2017-03-20 MED ORDER — INSULIN ASPART 100 UNIT/ML ~~LOC~~ SOLN
0.0000 [IU] | Freq: Three times a day (TID) | SUBCUTANEOUS | Status: DC
  Administered 2017-03-20: 2 [IU] via SUBCUTANEOUS
  Administered 2017-03-21: 1 [IU] via SUBCUTANEOUS
  Administered 2017-03-21: 2 [IU] via SUBCUTANEOUS
  Administered 2017-03-21 – 2017-03-23 (×3): 1 [IU] via SUBCUTANEOUS
  Administered 2017-03-24 – 2017-03-25 (×3): 2 [IU] via SUBCUTANEOUS
  Administered 2017-03-25: 1 [IU] via SUBCUTANEOUS

## 2017-03-20 MED ORDER — DEXTROSE 5 % IV SOLN
1.0000 g | INTRAVENOUS | Status: DC
  Filled 2017-03-20: qty 10

## 2017-03-20 MED ORDER — DEXTROSE 5 % IV SOLN
1.0000 g | Freq: Once | INTRAVENOUS | Status: AC
  Administered 2017-03-20: 1 g via INTRAVENOUS
  Filled 2017-03-20: qty 10

## 2017-03-20 MED ORDER — TUBERCULIN PPD 5 UNIT/0.1ML ID SOLN
5.0000 [IU] | Freq: Once | INTRADERMAL | Status: AC
  Administered 2017-03-20: 5 [IU] via INTRADERMAL
  Filled 2017-03-20: qty 0.1

## 2017-03-20 NOTE — Clinical Social Work Note (Signed)
Clinical Social Work Assessment  Patient Details  Name: Robert Salinas. MRN: [id number] Date of Birth: May 08, 1927  Date of referral:  03/20/17               Reason for consult:  Discharge Planning, [employer], Emotional/Coping/Adjustment to Illness                Permission sought to share information with:  Case Freight forwarder, Family Supports, Other Permission granted to share information::  Yes, Verbal Permission Granted  Name::        Agency::  APS: Morey Hummingbird Frees  Relationship::  Wife  Contact Information:     Housing/Transportation Living arrangements for the past 2 months:  [place] of Information:  Medical Team, Case Manager, Other (Comment Required) (APS) Patient Interpreter Needed:  None Criminal Activity/Legal Involvement Pertinent to Current Situation/Hospitalization:  No - Comment as needed Significant Relationships:  Adult Children, Other Family Members, Spouse Lives with:  Spouse Do you feel safe going back to the place where you live?  No Need for family participation in patient care:  Yes (Comment)  Care giving concerns:  Patient admits from home with his wife and per triage report  Patient was up and walking in his house this morning with a walker when he had an unwitnessed fall.  Patient has a Hx of alzheimer's and on occasion attempts to leave his home.  No notable bleeding or deformities.    LCSW received call from [place] worker: Morey Hummingbird Frees from [place] that she had an open case with patient. Reports case has been opened since 9/15 due to caregiver burden, unable to care for patient and patient up wandering [place] the roads ([address] where he was found by police.  Carrier reports she has been working on placing patient in memory unit with [place] ([place]  Carrier reports wife has her own medical issues and is overwhelmed and wants patient placed as she is unable to manage him safely at home.  Prior to admission, patient  was independent, walking around house with walker requiring constance supervision and redirection.  It is reported that wife has medical issues prohibiting her from caring for her husband, thus why APS and placement is being worked up.   Patient in [facility] ED and medical clearance is pending with possible:   Suspect patient's urinary tract infection may have contributed to his worsened mental status over the last few days and the worsening episodes including falls with his dementia. Called to hospitalist will be placed for admission given a urinary tract infection and concern for safety going home.   Social Worker assessment / plan:   Assessment and consult completed in ED with collateral from APS and wife.  Patient being admitted to inpatient for UTI workup.  LCSW has called and left messages for Nanine Means of [place] [facility] [place] in effort to see status of assessment and bed situation.  Also asked MD to place TB test and will apply for passar for patient once PT is able to clarify current needs and level of care.  Will ask MD to place PT consult for patient to understand current baseline in effort to assist with placement and discharge planning.  Plan:  Admit to inpatient for medical work up. It is clear patient needs placement per APS and family wishes, however APS educated that patient may not require SNF placement due to memory issues and independence and is more of supervision vs custodial care. APS voices understanding  and LCSW will follow up on unit regarding disposition once PT is able to complete consult and recommendations.  Family to be updated as well as APS as patient's treatment plan continues to develop.  Employment status:  Retired Nurse, adult PT Recommendations:  Not assessed at this time Information / Referral to community resources:     Patient/Family's Response to care:  Understranding  Patient/Family's Understanding of and Emotional Response to  Diagnosis, Current Treatment, and Prognosis:  Wife is overwhelmed by report and exhausted with care of patient.  Understanding of APS involvement and wishes for continued long term care.   Emotional Assessment Appearance:  Appears stated age Attitude/Demeanor/Rapport:    Affect (typically observed):  Other (Confused) Orientation:  Oriented to Self Alcohol / Substance use:  Not Applicable Psych involvement (Current and /or in the community):  No (Comment)  Discharge Needs  Concerns to be addressed:  Discharge Planning Concerns, Home Safety Concerns, Care Coordination, Financial / Insurance Concerns, Lack of Support, Adjustment to Illness Readmission within the last 30 days:  No Current discharge risk:  Other (Family member reporting they cannot take him home) Barriers to Discharge:  Continued Medical Work up   [place], LCSW 03/20/2017, 2:26 PM

## 2017-03-20 NOTE — ED Notes (Signed)
Please call report to Katie [phone] at [phone].

## 2017-03-20 NOTE — H&P (Addendum)
History and Physical    Robert Salinas. QHU:[id number] DOB: May 17, 1927 DOA: 03/20/2017  PCP: [name], MD  Patient coming from: Home  Chief Complaint: Fall   HPI: Robert Salinas. is a 83 y.o. male with medical history significant of Alzheimer dementia, atrial fibrillation, diabetes, hyperlipidemia, hypertension, hypothyroidism who presents after an episode of fall. Patient has advanced dementia and is unable to participate in history. History is gathered from ED physician, social work. No family is available at bedside. Patient presents from home after a fall. He was apparently walking in his house with a walker when he had an unwitnessed fall. Unclear history surrounding the fall. In the past few weeks, Adult Protective Services have been involved as patient's 77 year old wife is no longer able to care for him. Patient had been having worsening confusion, wandering outside the home. He was found by police wandering the roads recently.   On examination, patient has no complaints. He does not know where he is. He is unable to tell me his wife's name. He is unable to tell me where he lives or what happened today.   ED Course: Workup revealed urinary tract infection and patient was started on Rocephin. Social work also consulted for placement.  Review of Systems: Unable to review due to dementia   Past Medical History:  Diagnosis Date  . Atrial fibrillation ([place])   . BPH (benign prostatic hyperplasia)   . Chickenpox   . COLONIC POLYPS, HX OF 06/09/2009  . Dementia   . Diabetes mellitus   . Hx of colonic polyps   . Hyperlipidemia   . Hypertension   . Hypothyroidism   . Thrombocytopenia ([place])     Past Surgical History:  Procedure Laterality Date  . APPENDECTOMY    . PROSTATECTOMY     BPH cause?     reports that he has never smoked. He has never used smokeless tobacco. He reports that he does not drink alcohol or use drugs.  No Known Allergies  History reviewed.  No pertinent family history. Unable to review due to patient dementia   Prior to Admission medications   Medication Sig Start Date End Date Taking? Authorizing Provider  amLODipine (NORVASC) 2.5 MG tablet Take 1 tablet (2.5 mg total) by mouth daily. 03/15/17  Yes [name], MD  aspirin 81 MG tablet Take 1 tablet (81 mg total) by mouth daily. 03/03/16  Yes [name], MD  atorvastatin (LIPITOR) 10 MG tablet TAKE 1 TABLET DAILY 08/13/15  Yes [name], MD  donepezil (ARICEPT) 10 MG tablet TAKE 1 TABLET AT BEDTIME 03/13/16  Yes [name], MD  glimepiride (AMARYL) 4 MG tablet Take 1 tablet (4 mg total) by mouth daily with breakfast. 04/13/16  Yes [name], MD  levothyroxine (SYNTHROID, LEVOTHROID) 75 MCG tablet TAKE 1 TABLET DAILY BEFORE BREAKFAST 06/15/15  Yes [name], MD  naproxen sodium (ANAPROX) 220 MG tablet Take 220 mg by mouth daily as needed (used once for sleep).   Yes [provider]  glucose blood (ONETOUCH VERIO) test strip Use to test blood sugars daily. Dx: E11.9 10/27/15   [name], MD  Lancets [facility] ULTRASOFT) lancets Use to test blood sugars daily. Dx: E11.9 10/27/15   [name], MD    Physical Exam: Vitals:   03/20/17 0916 03/20/17 1030 03/20/17 1100 03/20/17 1639  BP: 126/80 114/88 106/78 113/74  Pulse: 82 87 88 74  Resp: 16 20 12  12  Temp: 99.7 F (37.6 C)     TempSrc: Rectal     SpO2: 96% 92% 96% 95%  Weight:      Height:         Constitutional: NAD, calm, comfortable Eyes: PERRL, lids and conjunctivae normal ENMT: Mucous membranes are dry. Posterior pharynx clear of any exudate or lesions. Neck: normal, supple, no masses, no thyromegaly Respiratory: clear to auscultation bilaterally, no wheezing, no crackles. Normal respiratory effort. No accessory muscle use.  Cardiovascular: Regular rate and rhythm, no murmurs / rubs / gallops. No extremity edema.  Abdomen: no tenderness, no masses  palpated. No hepatosplenomegaly. Bowel sounds positive.  Musculoskeletal: no clubbing / cyanosis. No joint deformity upper and lower extremities. Good ROM, no contractures. Normal muscle tone.  Skin: no rashes, lesions, ulcers. No induration Neurologic: CN 2-12 grossly intact. Strength 5/5 in all 4. +left pupil larger than right. Reviewed ophtho notes from 04/11/2016 which documented hx of pseudophakia and irregular iris holes Psychiatric: +dementia   Labs on Admission: I have personally reviewed following labs and imaging studies  CBC:  Recent Labs Lab 03/15/17 1451 03/20/17 1006  WBC 9.1 6.5  NEUTROABS  --  4.8  HGB 16.4 15.0  HCT 49.7 44.8  MCV 98.5 95.5  PLT 150.0 937*   Basic Metabolic Panel:  Recent Labs Lab 03/15/17 1451 03/20/17 1006  NA 143 141  K 4.4 4.3  CL 104 106  CO2 29 26  GLUCOSE 267* 304*  BUN 47* 49*  CREATININE 1.82* 1.85*  CALCIUM 9.5 8.9   GFR: Estimated Creatinine Clearance: 28.5 mL/min (A) (by C-G formula based on SCr of 1.85 mg/dL (H)). Liver Function Tests:  Recent Labs Lab 03/15/17 1451 03/20/17 1006  AST 23 47*  ALT 25 32  ALKPHOS 93 94  BILITOT 1.0 1.3*  PROT 6.5 6.5  ALBUMIN 4.2 3.7    Recent Labs Lab 03/20/17 1006  LIPASE 21   No results for input(s): AMMONIA in the last 168 hours. Coagulation Profile:  Recent Labs Lab 03/20/17 1006  INR 1.01   Cardiac Enzymes: No results for input(s): CKTOTAL, CKMB, CKMBINDEX, TROPONINI in the last 168 hours. BNP (last 3 results) No results for input(s): PROBNP in the last 8760 hours. HbA1C: No results for input(s): HGBA1C in the last 72 hours. CBG:  Recent Labs Lab 03/20/17 0822  GLUCAP 297*   Lipid Profile: No results for input(s): CHOL, HDL, LDLCALC, TRIG, CHOLHDL, LDLDIRECT in the last 72 hours. Thyroid Function Tests:  Recent Labs  03/20/17 1010  TSH 5.424*   Anemia Panel: No results for input(s): VITAMINB12, FOLATE, FERRITIN, TIBC, IRON, RETICCTPCT in the last  72 hours. Urine analysis:    Component Value Date/Time   COLORURINE YELLOW 03/20/2017 1231   APPEARANCEUR HAZY (A) 03/20/2017 1231   LABSPEC 1.013 03/20/2017 1231   PHURINE 5.0 03/20/2017 1231   GLUCOSEU >=500 (A) 03/20/2017 1231   HGBUR NEGATIVE 03/20/2017 1231   BILIRUBINUR NEGATIVE 03/20/2017 1231   BILIRUBINUR moderate 02/09/2011 1031   KETONESUR NEGATIVE 03/20/2017 1231   PROTEINUR 30 (A) 03/20/2017 1231   UROBILINOGEN 0.2 02/09/2011 1031   UROBILINOGEN 1.0 12/21/2008 2206   NITRITE NEGATIVE 03/20/2017 1231   LEUKOCYTESUR LARGE (A) 03/20/2017 1231   Sepsis Labs: !!!!!!!!!!!!!!!!!!!!!!!!!!!!!!!!!!!!!!!!!!!! @LABRCNTIP (procalcitonin:4,lacticidven:4) )No results found for this or any previous visit (from the past 240 hour(s)).   Radiological Exams on Admission: Dg Chest 2 View  Result Date: 03/20/2017 CLINICAL DATA:  Unwitnessed fall EXAM: CHEST  2 VIEW COMPARISON:  PA and lateral  chest x-ray of February 21, 2017 FINDINGS: The lungs are mildly hypoinflated but clear. The heart and pulmonary vascularity are normal. The mediastinum is normal in width. There is calcification in the wall of the aortic arch. There is mild multilevel degenerative disc disease of the thoracic spine. IMPRESSION: There is no acute cardiopulmonary abnormality. Stable hypoinflation accentuated by kyphosis. Thoracic aortic atherosclerosis. Electronically Signed   By: David  Martinique M.D.   On: 03/20/2017 09:40   Ct Head Wo Contrast  Result Date: 03/20/2017 CLINICAL DATA:  Unwitnessed fall. EXAM: CT HEAD WITHOUT CONTRAST CT CERVICAL SPINE WITHOUT CONTRAST TECHNIQUE: Multidetector CT imaging of the head and cervical spine was performed following the standard protocol without intravenous contrast. Multiplanar CT image reconstructions of the cervical spine were also generated. COMPARISON:  02/21/2017 FINDINGS: CT HEAD FINDINGS Brain: There is no evidence for acute hemorrhage, hydrocephalus, mass lesion, or abnormal  extra-axial fluid collection. No definite CT evidence for acute infarction. Diffuse loss of parenchymal volume is consistent with atrophy. Patchy low attenuation in the deep hemispheric and periventricular white matter is nonspecific, but likely reflects chronic microvascular ischemic demyelination. Vascular: No hyperdense vessel or unexpected calcification. Skull: No evidence for fracture. No worrisome lytic or sclerotic lesion. Sinuses/Orbits: The visualized paranasal sinuses and mastoid air cells are clear. Visualized portions of the globes and intraorbital fat are unremarkable. Other: None. CT CERVICAL SPINE FINDINGS Alignment: Slight accentuation normal cervical lordosis. Skull base and vertebrae: No acute fracture. No primary bone lesion or focal pathologic process. Soft tissues and spinal canal: No prevertebral fluid or swelling. No visible canal hematoma. Disc levels: Loss of disc height at C6-7 and to lesser degree at C5-6 is associated with endplate degeneration. Multilevel facet osteoarthritis is evident bilaterally. Upper chest: Negative. Other: None. IMPRESSION: 1. No acute intracranial abnormality. 2. Atrophy with chronic small vessel white matter ischemic disease. 3. Degenerative changes in the cervical spine without acute fracture. Electronically Signed   By: Misty Stanley M.D.   On: 03/20/2017 10:04   Ct Cervical Spine Wo Contrast  Result Date: 03/20/2017 CLINICAL DATA:  Unwitnessed fall. EXAM: CT HEAD WITHOUT CONTRAST CT CERVICAL SPINE WITHOUT CONTRAST TECHNIQUE: Multidetector CT imaging of the head and cervical spine was performed following the standard protocol without intravenous contrast. Multiplanar CT image reconstructions of the cervical spine were also generated. COMPARISON:  02/21/2017 FINDINGS: CT HEAD FINDINGS Brain: There is no evidence for acute hemorrhage, hydrocephalus, mass lesion, or abnormal extra-axial fluid collection. No definite CT evidence for acute infarction. Diffuse  loss of parenchymal volume is consistent with atrophy. Patchy low attenuation in the deep hemispheric and periventricular white matter is nonspecific, but likely reflects chronic microvascular ischemic demyelination. Vascular: No hyperdense vessel or unexpected calcification. Skull: No evidence for fracture. No worrisome lytic or sclerotic lesion. Sinuses/Orbits: The visualized paranasal sinuses and mastoid air cells are clear. Visualized portions of the globes and intraorbital fat are unremarkable. Other: None. CT CERVICAL SPINE FINDINGS Alignment: Slight accentuation normal cervical lordosis. Skull base and vertebrae: No acute fracture. No primary bone lesion or focal pathologic process. Soft tissues and spinal canal: No prevertebral fluid or swelling. No visible canal hematoma. Disc levels: Loss of disc height at C6-7 and to lesser degree at C5-6 is associated with endplate degeneration. Multilevel facet osteoarthritis is evident bilaterally. Upper chest: Negative. Other: None. IMPRESSION: 1. No acute intracranial abnormality. 2. Atrophy with chronic small vessel white matter ischemic disease. 3. Degenerative changes in the cervical spine without acute fracture. Electronically Signed   By: Randall Hiss  Tery Sanfilippo M.D.   On: 03/20/2017 10:04    EKG: Independently reviewed. Normal sinus rhythm   Assessment/Plan Active Problems:   UTI (urinary tract infection)  Fall -CT head and cervical spine without acute intracranial abnormality -PT OT eval   UTI, POA -Rocephin -Urine culture pending  Alzheimer dementia -SW has been involved with Adult Protective Services involvement -Patient has dementia and cannot participate in history -Has been wandering outside home recently, unsafe to return. Wife unable to care for him  -Read TB test in 2 days, test needed for placement  -SW consult -Continue aricept -Supportive care, tele monitor for safety   Paroxysmal A Fib -Normal sinus currently -Continue aspirin     DM type 2 -Hold amaryl. Would consider stopping sulfonylurea in this 83yo with advanced dementia and relative good control of DM  -Ha1c 7.1 -SSI   CKD stage III -Baseline Cr 1.7-1.9 -Stable   HTN -Continue norvasc  HLD -Continue lipitor   Elevated TSH -Check free T4  -Continue synthroid    DVT prophylaxis: subq hep  Code Status: DNR (in chart)  Family Communication: no family at bedside Disposition Plan: pending improvement, SNF vs memory care Consults called: none  Admission status: observation   Severity of Illness: The appropriate patient status for this patient is OBSERVATION. Observation status is judged to be reasonable and necessary in order to provide the required intensity of service to ensure the patient's safety. The patient's presenting symptoms, physical exam findings, and initial radiographic and laboratory data in the context of their medical condition is felt to place them at decreased risk for further clinical deterioration. Furthermore, it is anticipated that the patient will be medically stable for discharge from the hospital within 2 midnights of admission. The following factors support the patient status of observation.   " The patient's presenting symptoms include confusion, fall. " The physical exam findings include dementia and confusion. " The initial radiographic and laboratory data are consistent with UTI.    Dessa Phi, DO [facility] [email or web address] Password [email or web address] 03/20/2017, 5:14 PM

## 2017-03-20 NOTE — ED Notes (Signed)
Bed: WA25 Expected date:  Expected time:  Means of arrival:  Comments: EMS fall 

## 2017-03-20 NOTE — Progress Notes (Signed)
CSW received call from [place] will be visiting with patient today before 6PM to determine if appropriate for memory care.   Kingsley Spittle, [facility] Emergency Room Clinical Social Worker [phone]
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
## 2017-03-20 NOTE — ED Triage Notes (Signed)
Patient is alert with confusion.  Patient was up and walking in his house this morning with a walker when he had an unwitnessed fall.  Patient has a Hx of alzheimer's and on occasion attempts to leave his home.  No notable bleeding or deformities.

## 2017-03-20 NOTE — ED Notes (Addendum)
Brendolyn Patty,  Stepdaughter [phone] Password: [phone] Angie Hogg:  Daughter [place] Social Service worker [phone]

## 2017-03-21 DIAGNOSIS — N3000 Acute cystitis without hematuria: Secondary | ICD-10-CM | POA: Diagnosis not present

## 2017-03-21 LAB — URINALYSIS, ROUTINE W REFLEX MICROSCOPIC
BILIRUBIN URINE: NEGATIVE
GLUCOSE, UA: NEGATIVE mg/dL
KETONES UR: NEGATIVE mg/dL
NITRITE: NEGATIVE
PH: 5 (ref 5.0–8.0)
Protein, ur: 100 mg/dL — AB
Specific Gravity, Urine: 1.016 (ref 1.005–1.030)

## 2017-03-21 LAB — GLUCOSE, CAPILLARY
Glucose-Capillary: 128 mg/dL — ABNORMAL HIGH (ref 65–99)
Glucose-Capillary: 155 mg/dL — ABNORMAL HIGH (ref 65–99)
Glucose-Capillary: 125 mg/dL — ABNORMAL HIGH (ref 65–99)
Glucose-Capillary: 101 mg/dL — ABNORMAL HIGH (ref 65–99)

## 2017-03-21 LAB — CBC
HEMATOCRIT: 45.9 % (ref 39.0–52.0)
Hemoglobin: 15.4 g/dL (ref 13.0–17.0)
MCH: 32.6 pg (ref 26.0–34.0)
MCHC: 33.6 g/dL (ref 30.0–36.0)
MCV: 97.2 fL (ref 78.0–100.0)
PLATELETS: 145 10*3/uL — AB (ref 150–400)
RBC: 4.72 MIL/uL (ref 4.22–5.81)
RDW: 13.7 % (ref 11.5–15.5)
WBC: 6.7 10*3/uL (ref 4.0–10.5)

## 2017-03-21 LAB — BASIC METABOLIC PANEL
Anion gap: 9 (ref 5–15)
BUN: 44 mg/dL — ABNORMAL HIGH (ref 6–20)
CALCIUM: 8.9 mg/dL (ref 8.9–10.3)
CO2: 27 mmol/L (ref 22–32)
CREATININE: 1.69 mg/dL — AB (ref 0.61–1.24)
Chloride: 106 mmol/L (ref 101–111)
GFR, EST AFRICAN AMERICAN: 40 mL/min — AB (ref >60)
GFR, EST NON AFRICAN AMERICAN: 34 mL/min — AB (ref >60)
GLUCOSE: 146 mg/dL — AB (ref 65–99)
Potassium: 4.3 mmol/L (ref 3.5–5.1)
Sodium: 142 mmol/L (ref 135–145)

## 2017-03-21 MED ORDER — LEVOFLOXACIN IN D5W 500 MG/100ML IV SOLN: 500.0000 mg | INTRAVENOUS | Status: DC

## 2017-03-21 MED ORDER — SODIUM CHLORIDE 0.9 % IV SOLN
1.5000 g | Freq: Four times a day (QID) | INTRAVENOUS | Status: DC
  Administered 2017-03-21 – 2017-03-23 (×8): 1.5 g via INTRAVENOUS
  Filled 2017-03-21 (×8): qty 1.5

## 2017-03-21 MED ORDER — SODIUM CHLORIDE 0.9 % IV SOLN
INTRAVENOUS | Status: DC
Start: 2017-03-21 — End: 2017-03-23
  Administered 2017-03-21 – 2017-03-22 (×3): via INTRAVENOUS

## 2017-03-21 NOTE — Progress Notes (Signed)
Patient had only 200 cc urine output. Bladder scan, 370 cc of urine obtained. Assisted up out of bed this morning and patient was able to void 300 cc dark,  Concentrated urine. Am nurse to f/u with MD.

## 2017-03-21 NOTE — Progress Notes (Signed)
CSW contacted by patient's APS worker Morey Hummingbird Frees [phone] ext. [phone]). CSW provided update to patient's APS worker, APS worker reported that she was going to reach out to the patient's family to clarify patient's disposition for discharge. APS worker agreed to contact CSW with an update after speaking with patient's family. CSW awaiting return phone call.  Abundio Miu, [place] Social Worker [facility] Cell#: [phone]

## 2017-03-21 NOTE — Progress Notes (Signed)
   03/21/17 1043  PT Time Calculation  PT Start Time (ACUTE ONLY) 1018  PT Stop Time (ACUTE ONLY) 1033  PT Time Calculation (min) (ACUTE ONLY) 15 min  PT G-Codes **NOT FOR INPATIENT CLASS**  Functional Assessment Tool Used AM-PAC 6 Clicks Basic Mobility;Clinical judgement  Functional Limitation Mobility: Walking and moving around  Mobility: Walking and Moving Around Current Status (K8206) CJ  Mobility: Walking and Moving Around Goal Status (O1561) CI  PT General Charges  $$ ACUTE PT VISIT 1 Visit  PT Evaluation  $PT Eval Low Complexity 1 Low   Weston Anna, MPT [phone]

## 2017-03-21 NOTE — Progress Notes (Signed)
CSW spoke with patient's son at bedside regarding discharge planning. Patient's son reported that he is interested SNF at [facility]. CSW informed patient's son that [facility] accepted patient and would be able to provide patient with PT at ALF. Patient's son reported that patient is on a waiting list at [facility]. CSW agreed to follow up after finding out more about patient's disposition.   CSW contacted by patient's APS Worker Morey Hummingbird Frees) and informed that she has spoken with most patient's family about disposition and still needs to speak with patient's son. APS worker reported that patient will need to be placed at ALF. APS worker reported that once the plan is confirmed with all family members that she will contact CSW. APS worker reported that if family does not agree on plan that APS will make the final decision. CSW awaiting return call from patient's APS worker to find out final disposition for patient's discharge.   Abundio Miu, [place] Social Worker [facility] Cell#: [phone]

## 2017-03-21 NOTE — Progress Notes (Signed)
CSW contacted [facility] and spoke with staff member [facility]. Staff reported that their nurse came and evaluated patient yesterday and they feel patient is appropriate for their facility. CSW informed staff that PT recommended SNF for ST rehab, staff reported that they have therapy and would be able to provide PT at the ALF.   CSW contacted patient's APS worker to provide update [facility] [phone] no answer. CSW left voicemail requesting return call.   CSW awaiting return call from [place] worker.   Robert Salinas, [place] Social Worker [facility] Cell#: [phone]

## 2017-03-21 NOTE — Care Management Obs Status (Signed)
[place] NOTIFICATION   Patient Details  Name: [facility]. MRN: [id number] Date of Birth: 03-20-1927   Medicare Observation Status Notification Given:  Yes    Purcell Mouton, RN 03/21/2017, 12:56 PM

## 2017-03-21 NOTE — Progress Notes (Addendum)
MD made aware of dark urine.  Order for urinalysis.  Educated patient on importance of using a clean urinal when going to the bathroom so we can collect urine.  Patient missed urinal when voiding.  Will continue to attempt to get urinalysis.

## 2017-03-21 NOTE — Evaluation (Signed)
Physical Therapy Evaluation Patient Details Name: Hamid Brookens. MRN: [id number] DOB: [birth year] Today's Date: 03/21/2017   History of Present Illness  83 yo male admitted with UTI, fall. Hx of Alz, A fib, DM, HTN, hypothyroidism  Clinical Impression  On eval, pt required Min assist for mobility. He walked ~50 feet with a RW. Pt c/o L lower back pain and bil foot pain. He participated well. Recommend ST rehat at SNF. Will follow during hospital stay.     Follow Up Recommendations SNF    Equipment Recommendations  None recommended by PT    Recommendations for Other Services       Precautions / Restrictions Precautions Precautions: Fall Restrictions Weight Bearing Restrictions: No      Mobility  Bed Mobility Overal bed mobility: Needs Assistance Bed Mobility: Supine to Sit     Supine to sit: HOB elevated;Min guard     General bed mobility comments: Increased time. Cues for safety. Close guard for safety.  Transfers Overall transfer level: Needs assistance Equipment used: Rolling walker (2 wheeled) Transfers: Sit to/from Stand Sit to Stand: Min assist;From elevated surface         General transfer comment: Assist to rise, stabilize, control descent. VCs safety, technique, hand placement  Ambulation/Gait Ambulation/Gait assistance: Min assist Ambulation Distance (Feet): 50 Feet Assistive device: Rolling walker (2 wheeled) Gait Pattern/deviations: Step-through pattern;Narrow base of support     General Gait Details: Assist to stabilize pt and maneuver safely with RW. Pt fatigues easily. Cues for safety. Slow gait speed   Stairs            Wheelchair Mobility    Modified Rankin (Stroke Patients Only)       Balance Overall balance assessment: Needs assistance;History of Falls         Standing balance support: Bilateral upper extremity supported Standing balance-Leahy Scale: Poor                               Pertinent  Vitals/Pain Pain Assessment: Faces Faces Pain Scale: Hurts even more Pain Location: L lower back, both feet Pain Descriptors / Indicators: Sore Pain Intervention(s): Limited activity within patient's tolerance;Repositioned    Home Living Family/patient expects to be discharged to:: Skilled nursing facility                      Prior Function Level of Independence: Needs assistance   Gait / Transfers Assistance Needed: ambulatory with RW vs cane           Hand Dominance        Extremity/Trunk Assessment   Upper Extremity Assessment Upper Extremity Assessment: Generalized weakness    Lower Extremity Assessment Lower Extremity Assessment: Generalized weakness    Cervical / Trunk Assessment Cervical / Trunk Assessment: Kyphotic  Communication   Communication: HOH  Cognition Arousal/Alertness: Awake/alert Behavior During Therapy: WFL for tasks assessed/performed Overall Cognitive Status: History of cognitive impairments - at baseline                                        General Comments      Exercises     Assessment/Plan    PT Assessment Patient needs continued PT services  PT Problem List Decreased strength;Decreased mobility;Decreased balance;Decreased knowledge of use of DME;Decreased activity tolerance  PT Treatment Interventions DME instruction;Gait training;Therapeutic activities;Therapeutic exercise;Patient/family education;Balance training;Functional mobility training    PT Goals (Current goals can be found in the Care Plan section)  Acute Rehab PT Goals Patient Stated Goal: none stated PT Goal Formulation: Patient unable to participate in goal setting Time For Goal Achievement: 04/04/17 Potential to Achieve Goals: Fair    Frequency Min 3X/week   Barriers to discharge        Co-evaluation               AM-PAC PT "6 Clicks" Daily Activity  Outcome Measure Difficulty turning over in bed (including  adjusting bedclothes, sheets and blankets)?: A Little Difficulty moving from lying on back to sitting on the side of the bed? : A Little Difficulty sitting down on and standing up from a chair with arms (e.g., wheelchair, bedside commode, etc,.)?: Unable Help needed moving to and from a bed to chair (including a wheelchair)?: A Little Help needed walking in hospital room?: A Little Help needed climbing 3-5 steps with a railing? : A Little 6 Click Score: 16    End of Session Equipment Utilized During Treatment: Gait belt Activity Tolerance: Patient limited by fatigue Patient left: in chair;with family/visitor present;with chair alarm set   PT Visit Diagnosis: Muscle weakness (generalized) (M62.81);Difficulty in walking, not elsewhere classified (R26.2)    Time: 4098-1191 PT Time Calculation (min) (ACUTE ONLY): 15 min   Charges:   PT Evaluation $PT Eval Low Complexity: 1 Low     PT G Codes:        Weston Anna, MPT Pager: [phone]

## 2017-03-21 NOTE — Evaluation (Signed)
Occupational Therapy Evaluation Patient Details Name: Robert Salinas. MRN: [id number] DOB: May 07, 1927 Today's Date: 03/21/2017    History of Present Illness 83 yo male admitted with UTI, fall. Hx of Alz, A fib, DM, HTN, hypothyroidism   Clinical Impression   This 83 y/o M presents with the above. Pt unable to provide PLOF, however son present and reporting Pt was mod independent with functional mobility and receiving assist for ADL completion, has a history of falls. Pt required ModA for sit<>stand at RW this session, requires MaxA for LB ADLs and multimodal cues for task completion, suspect partly due to [facility] and baseline cognitive deficits. Pt will benefit from continued acute OT services and recommend additional OT services in SNF setting prior to return home to maximize Pt's safety and independence with ADLs and functional mobility and to reduce caregiver burden of care.     Follow Up Recommendations  SNF;Supervision/Assistance - 24 hour    Equipment Recommendations  Other (comment) (defer to next venue )           Precautions / Restrictions Precautions Precautions: Fall Restrictions Weight Bearing Restrictions: No      Mobility Bed Mobility Overal bed mobility: Needs Assistance Bed Mobility: Supine to Sit     Supine to sit: HOB elevated;Min guard     General bed mobility comments: OOB in recliner   Transfers Overall transfer level: Needs assistance Equipment used: Rolling walker (2 wheeled) Transfers: Sit to/from Stand Sit to Stand: Mod assist         General transfer comment: Assist to rise, stabilize, control descent. VCs safety, technique, hand placement    Balance Overall balance assessment: Needs assistance;History of Falls         Standing balance support: Bilateral upper extremity supported Standing balance-Leahy Scale: Poor                             ADL either performed or assessed with clinical judgement   ADL Overall ADL's  : Needs assistance/impaired Eating/Feeding: Set up;Sitting   Grooming: Set up;Sitting;Min guard   Upper Body Bathing: Minimal assistance;Sitting   Lower Body Bathing: Sit to/from stand;Minimal assistance   Upper Body Dressing : Minimal assistance;Sitting   Lower Body Dressing: Moderate assistance;Sit to/from stand Lower Body Dressing Details (indicate cue type and reason): Pt doffs/dons socks using figure 4 technique seated in recliner      Toileting- Clothing Manipulation and Hygiene: Moderate assistance;Sit to/from stand Toileting - Clothing Manipulation Details (indicate cue type and reason): Pt stood at RW to use urinal with RN holding urinal and therapist providing Min steady assist during static standing      Functional mobility during ADLs: Moderate assistance;Rolling walker (sit<>stand) General ADL Comments: Pt requires multimodal cues and repetition to complete tasks; only completed sit<>stand from recliner and small steps side to side as Pt's lunch arriving during session; provided setup assist for lunch prior to exiting                           Pertinent Vitals/Pain Pain Assessment: Faces Faces Pain Scale: Hurts a little bit Pain Location: L lower back, both feet Pain Descriptors / Indicators: Sore Pain Intervention(s): Limited activity within patient's tolerance;Monitored during session          Extremity/Trunk Assessment Upper Extremity Assessment Upper Extremity Assessment: Generalized weakness   Lower Extremity Assessment Lower Extremity Assessment: Defer to PT evaluation  Cervical / Trunk Assessment Cervical / Trunk Assessment: Kyphotic   Communication Communication Communication: HOH   Cognition Arousal/Alertness: Awake/alert Behavior During Therapy: WFL for tasks assessed/performed Overall Cognitive Status: History of cognitive impairments - at baseline                                     General Comments  Pt's son present  during session                [place] expects to be discharged to:: Skilled nursing facility Living Arrangements: Spouse/significant other                                      Prior Functioning/Environment Level of Independence: Needs assistance  Gait / Transfers Assistance Needed: ambulatory with RW vs cane ADL's / Homemaking Assistance Needed: Pt's son reporting Pt was mostly only wearing underwear over the past few weeks, was receiving assist for bathing, toilet hygiene, and dressing            OT Problem List: Decreased strength;Impaired balance (sitting and/or standing);Decreased activity tolerance;Decreased knowledge of use of DME or AE;Decreased cognition      OT Treatment/Interventions: Self-care/ADL training;DME and/or AE instruction;Therapeutic activities;Balance training;Therapeutic exercise;Energy conservation;Patient/family education    OT Goals(Current goals can be found in the care plan section) Acute Rehab OT Goals Patient Stated Goal: none stated OT Goal Formulation: With patient Time For Goal Achievement: 04/04/17 Potential to Achieve Goals: Good  OT Frequency: Min 2X/week                             AM-PAC PT "6 Clicks" Daily Activity     Outcome Measure Help from another person eating meals?: A Little Help from another person taking care of personal grooming?: A Little Help from another person toileting, which includes using toliet, bedpan, or urinal?: A Lot Help from another person bathing (including washing, rinsing, drying)?: A Lot Help from another person to put on and taking off regular upper body clothing?: A Little Help from another person to put on and taking off regular lower body clothing?: A Lot 6 Click Score: 15   End of Session Equipment Utilized During Treatment: Rolling walker Nurse Communication: Mobility status  Activity Tolerance: Patient tolerated treatment well Patient left: in  chair;with call bell/phone within reach;with chair alarm set;with family/visitor present  OT Visit Diagnosis: Unsteadiness on feet (R26.81);History of falling (Z91.81);Muscle weakness (generalized) (M62.81)                Time: 4656-8127 OT Time Calculation (min): 24 min Charges:  OT General Charges $OT Visit: 1 Visit OT Evaluation $OT Eval Moderate Complexity: 1 Mod G-Codes: OT G-codes **NOT FOR INPATIENT CLASS** Functional Assessment Tool Used: AM-PAC 6 Clicks Daily Activity;Clinical judgement Functional Limitation: Self care Self Care Current Status (N1700): At least 60 percent but less than 80 percent impaired, limited or restricted Self Care Goal Status (F7494): At least 20 percent but less than 40 percent impaired, limited or restricted   Lou Cal, OT Pager [phone] 03/21/2017   Raymondo Band 03/21/2017, 12:58 PM

## 2017-03-21 NOTE — Progress Notes (Addendum)
Patient ID: Robert Colonel., male   DOB: June 30, 1926, 83 y.o.   MRN: [id number]    PROGRESS NOTE    Robert Colonel.  YQI:[id number] DOB: 1926-09-05 DOA: 03/20/2017  PCP: [name], MD   Brief Narrative:  83 y.o. male with medical history significant of Alzheimer dementia, atrial fibrillation, diabetes, hyperlipidemia, hypertension, hypothyroidism who presented with AMS and recurrent falls. Recently found by police wondering around.   Assessment & Plan:  Acute metabolic encephalopathy - in pt with progressive Alzheimer's dementia, recurrent falls and UTI's - pt looks clinically stable this AM but certainly remains confused which is most likely his baseline - his family is at bedside and reports that pt has been progressively declining - I have reviewed imaging studies, CT head from 2012 and compared it to his current CT head, progressive and advancing severe and diffuse brain atrophy in frontal, temporal and occipital lobes noted, this was discussed in detail with family  - pt also with hx of recurrent UTI's and I am worried that Rocephin may not be adequate in coverage based on urine culture reports in 2012 and 2016, grew enterococcus sp. - will change Rocephin to Unasyn after discussion with pharmacist - will need to await for urine culture and sensitivity report before we can plan on narrowing down spectrum  - inpatient PT  - recommend SNF as the safest discharge option if family agrees and based on our discussion this AM, family is in agreement   UTI, hematuria  - Unasyn as noted above  - will repeat UA   Alzheimer dementia - SW has been involved with Adult Protective Services involvement - Has been wandering outside home recently, unsafe to return. 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  - agree with stopping sulfonylurea in this 83yo with advanced dementia and relative good control of DM  - HgA1C 7.1 - SSI inpatient   CKD stage III, acute kidney injury  - suspect pre renal component  - IVF provided and Cr is trending down - will repeat BMP in AM  HTN - Continue norvasc  HLD - Continue lipitor   Elevated TSH - Continue synthroid   DVT prophylaxis: Heparin Sq Code Status: DNR Family Communication: Patient and step daughter at bedside  Disposition Plan: to be determined, will likely need SNF  Consultants:   PT/OT  Procedures:   None  Antimicrobials:   Rocephin 10/03 --> 10/04  Unasyn 10/04 -->  Subjective: Pt reports feeling better.   Objective: Vitals:   03/20/17 1639 03/20/17 1725 03/20/17 2239 03/21/17 0513  BP: 113/74 138/76 119/69 108/76  Pulse: 74 71 75 86  Resp: 12 18 16 16   Temp:  97.8 F (36.6 C) 97.8 F (36.6 C) (!) 97.5 F (36.4 C)  TempSrc:  Oral Oral Axillary  SpO2: 95% 95% 98% 100%  Weight:  81.6 kg (179 lb 14.3 oz)    Height:        Intake/Output Summary (Last 24 hours) at 03/21/17 1444 Last data filed at 03/21/17 1109  Gross per 24 hour  Intake              170 ml  Output  550 ml  Net             -380 ml   Filed Weights   03/20/17 0801 03/20/17 1725  Weight: 83.5 kg (184 lb) 81.6 kg (179 lb 14.3 oz)    Examination:  General exam: Appears calm and comfortable, confused  Respiratory system: Clear to auscultation. Respiratory effort normal. Cardiovascular system: S1 & S2 heard, RRR. No JVD, murmurs, rubs, gallops or clicks. No pedal edema. Gastrointestinal system: Abdomen is nondistended, soft and nontender. No organomegaly or masses felt.  Data Reviewed: I have personally reviewed following labs and imaging studies  CBC:  Recent Labs Lab 03/15/17 1451 03/20/17 1006 03/21/17 0406  WBC 9.1 6.5 6.7  NEUTROABS  --  4.8  --   HGB 16.4 15.0 15.4  HCT 49.7  44.8 45.9  MCV 98.5 95.5 97.2  PLT 150.0 147* 277*   Basic Metabolic Panel:  Recent Labs Lab 03/15/17 1451 03/20/17 1006 03/21/17 0406  NA 143 141 142  K 4.4 4.3 4.3  CL 104 106 106  CO2 29 26 27   GLUCOSE 267* 304* 146*  BUN 47* 49* 44*  CREATININE 1.82* 1.85* 1.69*  CALCIUM 9.5 8.9 8.9   Liver Function Tests:  Recent Labs Lab 03/15/17 1451 03/20/17 1006  AST 23 47*  ALT 25 32  ALKPHOS 93 94  BILITOT 1.0 1.3*  PROT 6.5 6.5  ALBUMIN 4.2 3.7    Recent Labs Lab 03/20/17 1006  LIPASE 21   Coagulation Profile:  Recent Labs Lab 03/20/17 1006  INR 1.01   CBG:  Recent Labs Lab 03/20/17 0822 03/20/17 1735 03/20/17 2235 03/21/17 0747 03/21/17 1133  GLUCAP 297* 200* 119* 128* 155*   Thyroid Function Tests:  Recent Labs  03/20/17 1010 03/20/17 1747  TSH 5.424*  --   FREET4  --  1.03    Urine analysis:    Component Value Date/Time   COLORURINE YELLOW 03/20/2017 1231   APPEARANCEUR HAZY (A) 03/20/2017 1231   LABSPEC 1.013 03/20/2017 1231   PHURINE 5.0 03/20/2017 1231   GLUCOSEU >=500 (A) 03/20/2017 1231   HGBUR NEGATIVE 03/20/2017 1231   BILIRUBINUR NEGATIVE 03/20/2017 1231   BILIRUBINUR moderate 02/09/2011 1031   KETONESUR NEGATIVE 03/20/2017 1231   PROTEINUR 30 (A) 03/20/2017 1231   UROBILINOGEN 0.2 02/09/2011 1031   UROBILINOGEN 1.0 12/21/2008 2206   NITRITE NEGATIVE 03/20/2017 1231   LEUKOCYTESUR LARGE (A) 03/20/2017 1231    Recent Results (from the past 240 hour(s))  Urine culture     Status: None (Preliminary result)   Collection Time: 03/20/17 12:31 PM  Result Value Ref Range Status   Specimen Description URINE, CATHETERIZED  Final   Special Requests NONE  Final   Culture   Final    CULTURE REINCUBATED FOR BETTER GROWTH Performed at [facility], [place] [address], [place], [place] [address]    Report Status PENDING  Incomplete     Radiology Studies: Dg Chest 2 View  Result Date: 03/20/2017 CLINICAL DATA:  Unwitnessed  fall EXAM: CHEST  2 VIEW COMPARISON:  PA and lateral chest x-ray of February 21, 2017 FINDINGS: The lungs are mildly hypoinflated but clear. The heart and pulmonary vascularity are normal. The mediastinum is normal in width. There is calcification in the wall of the aortic arch. There is mild multilevel degenerative disc disease of the thoracic spine. IMPRESSION: There is no acute cardiopulmonary abnormality. Stable hypoinflation accentuated by kyphosis. Thoracic aortic atherosclerosis. Electronically Signed   By: David  Martinique M.D.  On: 03/20/2017 09:40   Ct Head Wo Contrast  Result Date: 03/20/2017 CLINICAL DATA:  Unwitnessed fall. EXAM: CT HEAD WITHOUT CONTRAST CT CERVICAL SPINE WITHOUT CONTRAST TECHNIQUE: Multidetector CT imaging of the head and cervical spine was performed following the standard protocol without intravenous contrast. Multiplanar CT image reconstructions of the cervical spine were also generated. COMPARISON:  02/21/2017 FINDINGS: CT HEAD FINDINGS Brain: There is no evidence for acute hemorrhage, hydrocephalus, mass lesion, or abnormal extra-axial fluid collection. No definite CT evidence for acute infarction. Diffuse loss of parenchymal volume is consistent with atrophy. Patchy low attenuation in the deep hemispheric and periventricular white matter is nonspecific, but likely reflects chronic microvascular ischemic demyelination. Vascular: No hyperdense vessel or unexpected calcification. Skull: No evidence for fracture. No worrisome lytic or sclerotic lesion. Sinuses/Orbits: The visualized paranasal sinuses and mastoid air cells are clear. Visualized portions of the globes and intraorbital fat are unremarkable. Other: None. CT CERVICAL SPINE FINDINGS Alignment: Slight accentuation normal cervical lordosis. Skull base and vertebrae: No acute fracture. No primary bone lesion or focal pathologic process. Soft tissues and spinal canal: No prevertebral fluid or swelling. No visible canal  hematoma. Disc levels: Loss of disc height at C6-7 and to lesser degree at C5-6 is associated with endplate degeneration. Multilevel facet osteoarthritis is evident bilaterally. Upper chest: Negative. Other: None. IMPRESSION: 1. No acute intracranial abnormality. 2. Atrophy with chronic small vessel white matter ischemic disease. 3. Degenerative changes in the cervical spine without acute fracture. Electronically Signed   By: Misty Stanley M.D.   On: 03/20/2017 10:04   Ct Cervical Spine Wo Contrast  Result Date: 03/20/2017 CLINICAL DATA:  Unwitnessed fall. EXAM: CT HEAD WITHOUT CONTRAST CT CERVICAL SPINE WITHOUT CONTRAST TECHNIQUE: Multidetector CT imaging of the head and cervical spine was performed following the standard protocol without intravenous contrast. Multiplanar CT image reconstructions of the cervical spine were also generated. COMPARISON:  02/21/2017 FINDINGS: CT HEAD FINDINGS Brain: There is no evidence for acute hemorrhage, hydrocephalus, mass lesion, or abnormal extra-axial fluid collection. No definite CT evidence for acute infarction. Diffuse loss of parenchymal volume is consistent with atrophy. Patchy low attenuation in the deep hemispheric and periventricular white matter is nonspecific, but likely reflects chronic microvascular ischemic demyelination. Vascular: No hyperdense vessel or unexpected calcification. Skull: No evidence for fracture. No worrisome lytic or sclerotic lesion. Sinuses/Orbits: The visualized paranasal sinuses and mastoid air cells are clear. Visualized portions of the globes and intraorbital fat are unremarkable. Other: None. CT CERVICAL SPINE FINDINGS Alignment: Slight accentuation normal cervical lordosis. Skull base and vertebrae: No acute fracture. No primary bone lesion or focal pathologic process. Soft tissues and spinal canal: No prevertebral fluid or swelling. No visible canal hematoma. Disc levels: Loss of disc height at C6-7 and to lesser degree at C5-6 is  associated with endplate degeneration. Multilevel facet osteoarthritis is evident bilaterally. Upper chest: Negative. Other: None. IMPRESSION: 1. No acute intracranial abnormality. 2. Atrophy with chronic small vessel white matter ischemic disease. 3. Degenerative changes in the cervical spine without acute fracture. Electronically Signed   By: Misty Stanley M.D.   On: 03/20/2017 10:04   Scheduled Meds: . amLODipine  2.5 mg Oral Daily  . aspirin  81 mg Oral Daily  . atorvastatin  10 mg Oral Daily  . donepezil  10 mg Oral QHS  . heparin  5,000 Units Subcutaneous Q8H  . insulin aspart  0-9 Units Subcutaneous TID WC  . levothyroxine  75 mcg Oral QAC breakfast  . tuberculin  5 Units Intradermal Once   Continuous Infusions: . ampicillin-sulbactam (UNASYN) IV      LOS: 0 days   Time spent: 25 minutes   Faye Ramsay, MD [facility] Pager [phone]  If 7PM-7AM, please contact night-coverage [email or web address] Password [email or web address] 03/21/2017, 2:44 PM

## 2017-03-22 DIAGNOSIS — N3000 Acute cystitis without hematuria: Secondary | ICD-10-CM | POA: Diagnosis not present

## 2017-03-22 LAB — GLUCOSE, CAPILLARY
GLUCOSE-CAPILLARY: 99 mg/dL (ref 65–99)
Glucose-Capillary: 83 mg/dL (ref 65–99)
Glucose-Capillary: 133 mg/dL — ABNORMAL HIGH (ref 65–99)
Glucose-Capillary: 127 mg/dL — ABNORMAL HIGH (ref 65–99)

## 2017-03-22 LAB — CBC
HCT: 45.9 % (ref 39.0–52.0)
Hemoglobin: 16 g/dL (ref 13.0–17.0)
MCH: 33.5 pg (ref 26.0–34.0)
MCHC: 34.9 g/dL (ref 30.0–36.0)
MCV: 96 fL (ref 78.0–100.0)
PLATELETS: 126 10*3/uL — AB (ref 150–400)
RBC: 4.78 MIL/uL (ref 4.22–5.81)
RDW: 13.6 % (ref 11.5–15.5)
WBC: 5.9 10*3/uL (ref 4.0–10.5)

## 2017-03-22 LAB — BASIC METABOLIC PANEL
ANION GAP: 11 (ref 5–15)
BUN: 38 mg/dL — ABNORMAL HIGH (ref 6–20)
CALCIUM: 8.4 mg/dL — AB (ref 8.9–10.3)
CO2: 23 mmol/L (ref 22–32)
CREATININE: 1.64 mg/dL — AB (ref 0.61–1.24)
Chloride: 106 mmol/L (ref 101–111)
GFR, EST AFRICAN AMERICAN: 41 mL/min — AB (ref >60)
GFR, EST NON AFRICAN AMERICAN: 36 mL/min — AB (ref >60)
GLUCOSE: 86 mg/dL (ref 65–99)
Potassium: 4.6 mmol/L (ref 3.5–5.1)
Sodium: 140 mmol/L (ref 135–145)

## 2017-03-22 NOTE — Plan of Care (Signed)
Problem: Safety: Goal: Ability to remain free from injury will improve Outcome: Progressing Safety precautions maintained, no fall or injury, patient is on low bed  Problem: Pain Managment: Goal: General experience of comfort will improve Outcome: Progressing Denies pain and discomfort  Problem: Skin Integrity: Goal: Risk for impaired skin integrity will decrease Outcome: Progressing Bruises noted to both arms, no opened areas noted, skin care performrd, no bed sores noted  Problem: Tissue Perfusion: Goal: Risk factors for ineffective tissue perfusion will decrease Outcome: Progressing Patient is on Heparin SQ, no signs of DVT noted  Problem: Activity: Goal: Risk for activity intolerance will decrease Outcome: Progressing Turning and repositioning well in the bed, tolerates very well  Problem: Bowel/Gastric: Goal: Will not experience complications related to bowel motility Outcome: Progressing No gastric or bowel complications noted

## 2017-03-22 NOTE — Progress Notes (Signed)
CSW spoke with patient's MD regarding whether patient will dc on IV antibiotics vs PO antibiotics. Patient's MD reported that she is waiting on cultures to make that determination.   CSW updated patient's APS worker [facility]). Patient's APS worker reported that she has been communicating with patient's family and [facility]. APS worker reported that patient will be able to [facility] if he is discharged on PO antibiotics. APS worker reported that if patient needs IV antibiotics that plan will be for SNF  CSW will complete PASRR. CSW awaiting information on patient's discharge medications which will inform patient's disposition.   CSW will continue to follow and assist with discharge planning.   Abundio Miu, [place] Social Worker [facility] Cell#: [phone]

## 2017-03-22 NOTE — Progress Notes (Signed)
Spoke with pt's son for a long length of time. Pt is not at baseline and not eating or drinking well as all, according to pt's son at bedside for 9 hrs. CSW following for discharge.

## 2017-03-22 NOTE — Progress Notes (Addendum)
Patient ID: Robert Colonel., male   DOB: 1927-01-17, 82 y.o.   MRN: [id number]    PROGRESS NOTE  Robert Colonel.  VPX:[id number] DOB: 05/15/1927 DOA: 03/20/2017  PCP: [name], MD   Brief Narrative:  83 y.o. male with medical history significant of Alzheimer dementia, atrial fibrillation, diabetes, hyperlipidemia, hypertension, hypothyroidism who presented with AMS and recurrent falls. Recently found by police wondering around.   Assessment & Plan:  Acute metabolic encephalopathy - in pt with progressive Alzheimer's dementia, recurrent falls and UTI's - pt looks more alert and clinically stable this AM, per family still not at baseline, pt still with poor oral intake, eating less than 25% of the meals provided  - I have reviewed imaging studies, CT head from 2012 and compared it to his current CT head, progressive and advancing severe and diffuse brain atrophy in frontal, temporal and occipital lobes noted, I have discussed and reviewed this again with family (pt's son) - pt also with hx of recurrent UTI's, previous urine culture reports in 2012 and 2016, grew enterococcus sp. - pt was initially started on rocephin and this was changed to Unasyn on 10/03 based on previous cultures sensitivity reports  - current Urine cultures with > 100K of unidentified sp, will need to follow up on final results before ABX regimen can be deescalated  - recommend SNF as the safest discharge option, family in agreement, prefer facility close to his home and his family/friends  - continue IVF until oral intake improves   UTI, hematuria  - Unasyn as noted above  - follow up on urine cultures   Alzheimer dementia - SW has been involved with Adult Protective Services involvement - Has been wandering outside home recently, unsafe to return. 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  - agree with stopping sulfonylurea in this 83yo with advanced dementia and relative good control of DM  - HgA1C 7.1 - SSI inpatient   CKD stage III, acute kidney injury  - suspect pre renal component  - continue IVF, Cr is trending down  - will repeat BMP in AM  Thrombocytopenia - mild, will continue to follow - CBC in AM  HTN - Continue norvasc  HLD - Continue lipitor   Elevated TSH - Continue synthroid   DVT prophylaxis: Heparin Sq Code Status: Son asked to have code changed to FULL CODE Family Communication: updated son over the phone Disposition Plan: to be determined, will likely need SNF once we have urine cultures back   Consultants:   PT/OT  Procedures:   None  Antimicrobials:   Rocephin 10/03 --> 10/04  Unasyn 10/04 -->  Subjective: Pt reports feeling better.   Objective: Vitals:   03/20/17 2239 03/21/17 0513 03/21/17 2100 03/22/17 0430  BP: 119/69 108/76 115/70 (!) 142/75  Pulse: 75 86 70 68  Resp: 16 16 16 18   Temp: 97.8 F (36.6 C) (!) 97.5 F (36.4 C) 97.7 F (36.5 C) 97.7 F (36.5 C)  TempSrc: Oral Axillary Oral Oral  SpO2: 98% 100% 97% 100%  Weight:      Height:        Intake/Output Summary (Last 24 hours) at 03/22/17 1200 Last data filed at 03/22/17 0901  Gross  per 24 hour  Intake             1165 ml  Output              200 ml  Net              965 ml   Filed Weights   03/20/17 0801 03/20/17 1725  Weight: 83.5 kg (184 lb) 81.6 kg (179 lb 14.3 oz)   Physical Exam  Constitutional: Appears calm, pleasant, NAD CVS: RRR, S1/S2 +, no gallops, no carotid bruit.  Pulmonary: Effort and breath sounds normal, no stridor, rhonchi, wheezes, rales.  Abdominal: Soft. BS +,  no distension, tenderness, rebound or guarding.  Neuro: Alert. Normal reflexes, muscle tone coordination. No cranial nerve deficit.  Data Reviewed: I have personally reviewed  following labs and imaging studies  CBC:  Recent Labs Lab 03/15/17 1451 03/20/17 1006 03/21/17 0406 03/22/17 0409  WBC 9.1 6.5 6.7 5.9  NEUTROABS  --  4.8  --   --   HGB 16.4 15.0 15.4 16.0  HCT 49.7 44.8 45.9 45.9  MCV 98.5 95.5 97.2 96.0  PLT 150.0 147* 145* 381*   Basic Metabolic Panel:  Recent Labs Lab 03/15/17 1451 03/20/17 1006 03/21/17 0406 03/22/17 0409  NA 143 141 142 140  K 4.4 4.3 4.3 4.6  CL 104 106 106 106  CO2 29 26 27 23   GLUCOSE 267* 304* 146* 86  BUN 47* 49* 44* 38*  CREATININE 1.82* 1.85* 1.69* 1.64*  CALCIUM 9.5 8.9 8.9 8.4*   Liver Function Tests:  Recent Labs Lab 03/15/17 1451 03/20/17 1006  AST 23 47*  ALT 25 32  ALKPHOS 93 94  BILITOT 1.0 1.3*  PROT 6.5 6.5  ALBUMIN 4.2 3.7    Recent Labs Lab 03/20/17 1006  LIPASE 21   Coagulation Profile:  Recent Labs Lab 03/20/17 1006  INR 1.01   CBG:  Recent Labs Lab 03/21/17 0747 03/21/17 1133 03/21/17 1652 03/21/17 2108 03/22/17 0737  GLUCAP 128* 155* 125* 101* 83   Thyroid Function Tests:  Recent Labs  03/20/17 1010 03/20/17 1747  TSH 5.424*  --   FREET4  --  1.03    Urine analysis:    Component Value Date/Time   COLORURINE YELLOW 03/21/2017 1930   APPEARANCEUR CLOUDY (A) 03/21/2017 1930   LABSPEC 1.016 03/21/2017 1930   PHURINE 5.0 03/21/2017 1930   GLUCOSEU NEGATIVE 03/21/2017 1930   HGBUR LARGE (A) 03/21/2017 1930   BILIRUBINUR NEGATIVE 03/21/2017 1930   BILIRUBINUR moderate 02/09/2011 1031   KETONESUR NEGATIVE 03/21/2017 1930   PROTEINUR 100 (A) 03/21/2017 1930   UROBILINOGEN 0.2 02/09/2011 1031   UROBILINOGEN 1.0 12/21/2008 2206   NITRITE NEGATIVE 03/21/2017 1930   LEUKOCYTESUR LARGE (A) 03/21/2017 1930    Recent Results (from the past 240 hour(s))  Urine culture     Status: Abnormal (Preliminary result)   Collection Time: 03/20/17 12:31 PM  Result Value Ref Range Status   Specimen Description URINE, CATHETERIZED  Final   Special Requests NONE   Final   Culture (A)  Final    >=100,000 COLONIES/mL UNIDENTIFIED ORGANISM Performed at [facility], [place] [address], [place], [place] [address]    Report Status PENDING  Incomplete     Radiology Studies: No results found. Scheduled Meds: . amLODipine  2.5 mg Oral Daily  . aspirin  81 mg Oral Daily  . atorvastatin  10 mg Oral Daily  . donepezil  10 mg Oral QHS  .  heparin  5,000 Units Subcutaneous Q8H  . insulin aspart  0-9 Units Subcutaneous TID WC  . levothyroxine  75 mcg Oral QAC breakfast  . tuberculin  5 Units Intradermal Once   Continuous Infusions: . sodium chloride 75 mL/hr at 03/22/17 0348  . ampicillin-sulbactam (UNASYN) IV 1.5 g (03/22/17 1132)    LOS: 0 days   Time spent: 25 minutes   Faye Ramsay, MD [facility] Pager [phone]  If 7PM-7AM, please contact night-coverage [email or web address] Password [email or web address] 03/22/2017, 12:00 PM

## 2017-03-23 DIAGNOSIS — W19XXXA Unspecified fall, initial encounter: Secondary | ICD-10-CM | POA: Diagnosis not present

## 2017-03-23 DIAGNOSIS — N3000 Acute cystitis without hematuria: Secondary | ICD-10-CM | POA: Diagnosis not present

## 2017-03-23 DIAGNOSIS — E119 Type 2 diabetes mellitus without complications: Secondary | ICD-10-CM | POA: Diagnosis not present

## 2017-03-23 LAB — URINE CULTURE

## 2017-03-23 LAB — CBC
HEMATOCRIT: 42.9 % (ref 39.0–52.0)
Hemoglobin: 14.2 g/dL (ref 13.0–17.0)
MCH: 32.1 pg (ref 26.0–34.0)
MCHC: 33.1 g/dL (ref 30.0–36.0)
MCV: 97.1 fL (ref 78.0–100.0)
Platelets: 143 10*3/uL — ABNORMAL LOW (ref 150–400)
RBC: 4.42 MIL/uL (ref 4.22–5.81)
RDW: 13.4 % (ref 11.5–15.5)
WBC: 4.4 10*3/uL (ref 4.0–10.5)

## 2017-03-23 LAB — BASIC METABOLIC PANEL
Anion gap: 8 (ref 5–15)
BUN: 30 mg/dL — AB (ref 6–20)
CHLORIDE: 109 mmol/L (ref 101–111)
CO2: 24 mmol/L (ref 22–32)
Calcium: 8.3 mg/dL — ABNORMAL LOW (ref 8.9–10.3)
Creatinine, Ser: 1.44 mg/dL — ABNORMAL HIGH (ref 0.61–1.24)
GFR calc Af Amer: 48 mL/min — ABNORMAL LOW (ref >60)
GFR calc non Af Amer: 42 mL/min — ABNORMAL LOW (ref >60)
GLUCOSE: 91 mg/dL (ref 65–99)
POTASSIUM: 4.4 mmol/L (ref 3.5–5.1)
Sodium: 141 mmol/L (ref 135–145)

## 2017-03-23 LAB — GLUCOSE, CAPILLARY
GLUCOSE-CAPILLARY: 142 mg/dL — AB (ref 65–99)
Glucose-Capillary: 64 mg/dL — ABNORMAL LOW (ref 65–99)
Glucose-Capillary: 98 mg/dL (ref 65–99)
Glucose-Capillary: 105 mg/dL — ABNORMAL HIGH (ref 65–99)
Glucose-Capillary: 137 mg/dL — ABNORMAL HIGH (ref 65–99)

## 2017-03-23 MED ORDER — DOCUSATE SODIUM 100 MG PO CAPS
100.0000 mg | ORAL_CAPSULE | Freq: Two times a day (BID) | ORAL | Status: DC | PRN
  Administered 2017-03-23: 100 mg via ORAL
  Filled 2017-03-23: qty 1

## 2017-03-23 MED ORDER — AMOXICILLIN 250 MG PO CAPS
500.0000 mg | ORAL_CAPSULE | Freq: Two times a day (BID) | ORAL | Status: DC
  Administered 2017-03-23 – 2017-03-26 (×6): 500 mg via ORAL
  Filled 2017-03-23 (×6): qty 2

## 2017-03-23 MED ORDER — ACETAMINOPHEN 325 MG PO TABS
650.0000 mg | ORAL_TABLET | Freq: Four times a day (QID) | ORAL | Status: DC | PRN
  Filled 2017-03-23 (×2): qty 2

## 2017-03-23 NOTE — Progress Notes (Signed)
Physical Therapy Treatment Patient Details Name: Robert Salinas. MRN: [id number] DOB: 02-11-1927 Today's Date: 03/23/2017    History of Present Illness 83 yo male admitted with UTI, fall. Hx of Alz, A fib, DM, HTN, hypothyroidism    PT Comments    Increased assistance required on today. Mod assist to stand and take lateral steps along side of bed. Unable to safely attempt ambulation on today with +1 assist (+2 assist not available). Pt c/o pain in both feet-pain limited mobility. Continue to recommend SNF.     Follow Up Recommendations  SNF     Equipment Recommendations  None recommended by PT    Recommendations for Other Services       Precautions / Restrictions Precautions Precautions: Fall Restrictions Weight Bearing Restrictions: No    Mobility  Bed Mobility Overal bed mobility: Needs Assistance Bed Mobility: Supine to Sit;Sit to Supine     Supine to sit: Min guard;HOB elevated Sit to supine: HOB elevated;Min guard   General bed mobility comments: Increased time. Cues for completion of task.   Transfers Overall transfer level: Needs assistance Equipment used: Rolling walker (2 wheeled) Transfers: Sit to/from Stand Sit to Stand: Mod assist;From elevated surface         General transfer comment: highly elevated bed surface. Assist to rise, stabilize, control descent. VCS safety, hand placement.   Ambulation/Gait Ambulation/Gait assistance: Mod assist           General Gait Details: Side steps along side of bed with RW. Increased time. Cues for safety. Pt unable ambulate safely on today without +2 assist.    Stairs            Wheelchair Mobility    Modified Rankin (Stroke Patients Only)       Balance Overall balance assessment: Needs assistance         Standing balance support: Bilateral upper extremity supported Standing balance-Leahy Scale: Poor                              Cognition Arousal/Alertness:  Awake/alert Behavior During Therapy: WFL for tasks assessed/performed Overall Cognitive Status: History of cognitive impairments - at baseline                                        Exercises  Long Arc Quads, 10 reps, both, AROM, Seated  Marching/Hip flexion, 10 reps, both, AROM, Seated    General Comments        Pertinent Vitals/Pain Pain Assessment: Faces Faces Pain Scale: Hurts little more Pain Location: L lower back, both feet Pain Descriptors / Indicators: Sore Pain Intervention(s): Limited activity within patient's tolerance;Repositioned    Home Living                      Prior Function            PT Goals (current goals can now be found in the care plan section) Progress towards PT goals: Not progressing toward goals - comment (increased assistance required on today. )    Frequency    Min 3X/week      PT Plan Current plan remains appropriate    Co-evaluation              AM-PAC PT "6 Clicks" Daily Activity  Outcome Measure  Difficulty turning over in bed (including  adjusting bedclothes, sheets and blankets)?: A Little Difficulty moving from lying on back to sitting on the side of the bed? : A Little Difficulty sitting down on and standing up from a chair with arms (e.g., wheelchair, bedside commode, etc,.)?: Unable Help needed moving to and from a bed to chair (including a wheelchair)?: A Lot Help needed walking in hospital room?: A Lot Help needed climbing 3-5 steps with a railing? : A Lot 6 Click Score: 13    End of Session Equipment Utilized During Treatment: Gait belt Activity Tolerance: Patient limited by fatigue;Patient limited by pain Patient left: in bed;with family/visitor present;with call bell/phone within reach;with bed alarm set   PT Visit Diagnosis: Muscle weakness (generalized) (M62.81);Difficulty in walking, not elsewhere classified (R26.2)     Time: 2330-0762 PT Time Calculation (min) (ACUTE ONLY):  14 min  Charges:  $Therapeutic Activity: 8-22 mins                    G Codes:          Weston Anna, MPT Pager: [phone]

## 2017-03-23 NOTE — Progress Notes (Signed)
CSW contacted by APS and informed that patient's final disposition is [facility]. APS worker reported that this plan has been confirmed with all family. APS worker reported that patient's wife and step daughter were going to the facility to sign him in.   CSW spoke with staff member Crystal from [facility] and confirmed patient's bed offer. Staff requested clinical documents, CSW provided available requested clinical documents.   CSW updated patient's attending MD.  CSW contacted by patient's APS worker [facility]) and informed that patient will not be able to discharge to ALF until Monday. APS worker reported that [place] [facility] [place] legal department would not allow patient's family to sign him in and they are requiring a legal guardian to sign patient in. APS worker reported that she would filing for legal guardianship of patient on Monday.  CSW updated patient's attending MD.  CSW awaiting APS to obtain legal guardianship to discharge patient to ALF. CSW will continue to follow and assist with discharge planning.  Abundio Miu, [place] Social Worker [facility] Cell#: [phone]

## 2017-03-23 NOTE — NC FL2 (Signed)
[place] LEVEL OF CARE SCREENING TOOL     IDENTIFICATION  Patient Name: Robert Salinas. Birthdate: May 02, 1927 Sex: male Admission Date (Current Location): 03/20/2017  [facility] and [place] Number:  Herbalist and Address:  [facility],  [place], [place]      Provider Number: [id number]  Attending Physician Name and Address:  Patrecia Pour, MD  Relative Name and Phone Number:       Current Level of Care: Hospital Recommended Level of Care: [place] Prior Approval Number:    Date Approved/Denied:   PASRR Number: [id number]  Discharge Plan: Other (Comment) ([facility])    Current Diagnoses: Patient Active Problem List   Diagnosis Date Noted  . UTI (urinary tract infection) 03/20/2017  . DNR (do not resuscitate) 12/08/2013  . Hypothyroid 10/09/2011  . CKD (chronic kidney disease), stage III ([place]) 02/21/2011  . Diabetes mellitus type II, controlled ([place]) 06/09/2009  . Hyperlipidemia 06/09/2009  . ALZHEIMER'S DISEASE, EARLY 06/09/2009  . Essential hypertension 06/09/2009  . Atrial fibrillation ([place]) 06/09/2009    Orientation RESPIRATION BLADDER Height & Weight     Self  Normal Incontinent Weight: 179 lb 14.3 oz (81.6 kg) Height:  5\' 10"  (177.8 cm)  BEHAVIORAL SYMPTOMS/MOOD NEUROLOGICAL BOWEL NUTRITION STATUS  Wanderer     Diet (regular)  AMBULATORY STATUS COMMUNICATION OF NEEDS Skin   Limited Assist Verbally Normal                       Personal Care Assistance Level of Assistance  Bathing, Feeding, Dressing Bathing Assistance: Maximum assistance Feeding assistance: Limited assistance Dressing Assistance: Limited assistance     Functional Limitations Info  Hearing   Hearing Info: Impaired      SPECIAL CARE FACTORS FREQUENCY  PT (By licensed PT), OT (By licensed OT)     PT Frequency: HHPT OT Frequency: HHOT             Contractures Contractures Info: Not present    Additional Factors Info  Code Status, Allergies Code Status Info: Full Code Allergies Info: NKA           Current Medications (03/23/2017):  This is the current hospital active medication list Current Facility-Administered Medications  Medication Dose Route Frequency Provider Last Rate Last Dose  . amLODipine (NORVASC) tablet 2.5 mg  2.5 mg Oral Daily Dessa Phi Chahn-Yang, DO   2.5 mg at 03/23/17 1059  . amoxicillin (AMOXIL) capsule 500 mg  500 mg Oral Q12H Patrecia Pour, MD      . aspirin chewable tablet 81 mg  81 mg Oral Daily Dessa Phi Chahn-Yang, DO   81 mg at 03/23/17 1059  . atorvastatin (LIPITOR) tablet 10 mg  10 mg Oral Daily Dessa Phi Chahn-Yang, DO   10 mg at 03/23/17 1059  . docusate sodium (COLACE) capsule 100 mg  100 mg Oral BID PRN Patrecia Pour, MD   100 mg at 03/23/17 1307  . donepezil (ARICEPT) tablet 10 mg  10 mg Oral QHS Dessa Phi Chahn-Yang, DO   10 mg at 03/22/17 2112  . heparin injection 5,000 Units  5,000 Units Subcutaneous Q8H Dessa Phi [place], DO   5,000 Units at 03/23/17 1307  . insulin aspart (novoLOG) injection 0-9 Units  0-9 Units Subcutaneous TID WC Dessa Phi Chahn-Yang, DO   1 Units at 03/22/17 1629  . levothyroxine (SYNTHROID, LEVOTHROID) tablet 75 mcg  75 mcg Oral QAC breakfast Dessa Phi Chahn-Yang, DO   75 mcg at 03/23/17 0745     Discharge Medications: Please see discharge summary for a list of discharge medications.  Medication List     TAKE these medications   amLODipine 2.5 MG tablet Commonly known as:  NORVASC Take 1 tablet (2.5 mg total) by mouth daily.   aspirin 81 MG tablet Take 1 tablet (81 mg total) by mouth daily.   atorvastatin 10 MG tablet Commonly known as:  LIPITOR TAKE 1 TABLET DAILY   donepezil 10 MG tablet Commonly known as:  ARICEPT TAKE 1 TABLET AT BEDTIME   glimepiride 4 MG tablet Commonly known as:  AMARYL TAKE 1  TABLET DAILY WITH BREAKFAST What changed:  See the new instructions.   glucose blood test strip Commonly known as:  ONETOUCH VERIO Use to test blood sugars daily. Dx: E11.9   levothyroxine 75 MCG tablet Commonly known as:  SYNTHROID, LEVOTHROID TAKE 1 TABLET DAILY BEFORE BREAKFAST   naproxen sodium 220 MG tablet Commonly known as:  ANAPROX Take 220 mg by mouth daily as needed (used once for sleep).   onetouch ultrasoft lancets Use to test blood sugars daily. Dx: E11.9      Relevant Imaging Results:  Relevant Lab Results:   Additional Information SSN [id number]   Patient needs HHPT and [place], LCSW

## 2017-03-23 NOTE — Progress Notes (Signed)
Patient ID: Robert Colonel., male   DOB: [birth year], 83 y.o.   MRN: [id number]    PROGRESS NOTE  Robert Colonel.  OXB:[id number] DOB: 09-Aug-1926 DOA: 03/20/2017  PCP: [name], MD   Brief Narrative:  Robert Nigh. is a 83 y.o. male with a history of alzheimer's dementia, hypothyroidism, remote AFib on ASA, well-controlled T2DM, and HTN who presented 10/2 due to AMS. He was found to have a UTI and dehydration. With treatment, mentation has cleared, though he remains with cognitive impairment.   Acute metabolic encephalopathy: On chronic cognitive impairment, having been on aricept in the past. Acute component is resolving with Tx of UTI. PO intake improving. Other work up unremarkable. TSH mildly elevated with normal T4, CT head with progressive and advancing severe and diffuse brain atrophy in frontal, temporal and occipital lobes noted. No indication for neurology or psychiatry consultations while inpatient.  - Monitor in memory care unit - Continue UTI Tx as below  Enterococcus UTI: Pan-sensitive, taking better po. No fever, leukocytosis.  - Switch to amoxicillin, dosed renally at 500mg  BID.  - Consider repeat UA to monitor hematuria (though this was noted on catheterized specimen)  Alzheimer dementia: SW has been involved with Adult Protective Services involvement due to several wandering episodes. - Delirium precautions - Consider outpatient neurocognitive evaluation.  - Will need supervision at discharge, plan to DC to [facility] care unit, though legal department there requires that a guardian check him in (not himself or his family). Thus, he will be hospitalized until 10/8 as DC elsewhere is unsafe.  Paroxysmal A Fib: Has maintained NSR.  - Continuing ASA (fall risk precludes anticoagulation).   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  - Hold  [facility]. GFR borderline, so metformin not started.  - SSI as inpatient  AKI on CKD stage III: SCr 1.85 at admission, trending downward with IV fluids. No dedicated work up indicated given improvement.  - Monitor intermittently.  - Avoid nephrotoxins  Thrombocytopenia: Mild, chronic.  - Monitor - Continue ASA as long as platelets >50k.   HTN - Continue norvasc  HLD - Continue lipitor   Hypothyroidism: TSH 5.424 with normal free T4 (1.03) - Continue synthroid at current dose.   DVT prophylaxis: Heparin SQ Code Status: Son asked to have code changed to FULL CODE Family Communication: Son at bedside Disposition Plan: [facility] once APS obtains guardianship.   Consultants:   PT/OT  Procedures:   None  Antimicrobials:   Rocephin 10/03 --> 10/04  Unasyn 10/04 --> 10/05  Amoxicillin 10/05 --> 10/07  Subjective: Pt much more alert per Son at bedside, pt without complaints of pain or fever.   Objective: BP 128/63 (BP Location: Left Arm)   Pulse 74   Temp (!) 97.5 F (36.4 C) (Oral)   Resp 16   Ht 5\' 10"  (1.778 m)   Wt 81.6 kg (179 lb 14.3 oz)   SpO2 98%   BMI 25.81 kg/m   Gen: Elderly, pleasant 83 y.o.male in NAD HEENT: MMM, posterior oropharynx clear Pulm: Non-labored; CTAB, no wheezes  CV: Regular rate, no murmur appreciated; distal pulses intact/symmetric GI: + BS; soft, non-tender, non-distended Skin: No rashes, wounds, ulcers. Scattered senile purpura without wounds.  Neuro: Alert, not oriented, No focal sensorimotor deficits  CBC:  Recent Labs Lab 03/20/17 1006 03/21/17 0406 03/22/17 0409 03/23/17 0402  WBC 6.5 6.7 5.9 4.4  NEUTROABS 4.8  --   --   --   HGB 15.0 15.4 16.0 14.2  HCT 44.8 45.9 45.9 42.9  MCV 95.5 97.2 96.0 97.1  PLT 147* 145* 126* 622*   Basic Metabolic Panel:  Recent Labs Lab 03/20/17 1006 03/21/17 0406 03/22/17 0409 03/23/17 0402  NA 141 142 140 141  K 4.3 4.3 4.6 4.4  CL 106 106 106 109  CO2 26 27  23 24   GLUCOSE 304* 146* 86 91  BUN 49* 44* 38* 30*  CREATININE 1.85* 1.69* 1.64* 1.44*  CALCIUM 8.9 8.9 8.4* 8.3*   Liver Function Tests:  Recent Labs Lab 03/20/17 1006  AST 47*  ALT 32  ALKPHOS 94  BILITOT 1.3*  PROT 6.5  ALBUMIN 3.7    Recent Labs Lab 03/20/17 1006  LIPASE 21   Coagulation Profile:  Recent Labs Lab 03/20/17 1006  INR 1.01   CBG:  Recent Labs Lab 03/22/17 1629 03/22/17 2114 03/23/17 0735 03/23/17 0807 03/23/17 1154  GLUCAP 133* 127* 64* 98 105*   Thyroid Function Tests:  Recent Labs  03/20/17 1747  FREET4 1.03    Urine analysis:    Component Value Date/Time   COLORURINE YELLOW 03/21/2017 1930   APPEARANCEUR CLOUDY (A) 03/21/2017 1930   LABSPEC 1.016 03/21/2017 1930   PHURINE 5.0 03/21/2017 1930   GLUCOSEU NEGATIVE 03/21/2017 1930   HGBUR LARGE (A) 03/21/2017 1930   BILIRUBINUR NEGATIVE 03/21/2017 1930   BILIRUBINUR moderate 02/09/2011 1031   KETONESUR NEGATIVE 03/21/2017 1930   PROTEINUR 100 (A) 03/21/2017 1930   UROBILINOGEN 0.2 02/09/2011 1031   UROBILINOGEN 1.0 12/21/2008 2206   NITRITE NEGATIVE 03/21/2017 1930   LEUKOCYTESUR LARGE (A) 03/21/2017 1930    Recent Results (from the past 240 hour(s))  Urine culture     Status: Abnormal   Collection Time: 03/20/17 12:31 PM  Result Value Ref Range Status   Specimen Description URINE, CATHETERIZED  Final   Special Requests NONE  Final   Culture >=100,000 COLONIES/mL ENTEROCOCCUS FAECALIS (A)  Final   Report Status 03/23/2017 FINAL  Final   Organism ID, Bacteria ENTEROCOCCUS FAECALIS (A)  Final      Susceptibility   Enterococcus faecalis - MIC*    AMPICILLIN <=2 SENSITIVE Sensitive     LEVOFLOXACIN 0.5 SENSITIVE Sensitive     NITROFURANTOIN <=16 SENSITIVE Sensitive     VANCOMYCIN <=0.5 SENSITIVE Sensitive     * >=100,000 COLONIES/mL ENTEROCOCCUS FAECALIS     Radiology Studies: No results found. Scheduled Meds: . amLODipine  2.5 mg Oral Daily  . amoxicillin  500  mg Oral Q12H  . aspirin  81 mg Oral Daily  . atorvastatin  10 mg Oral Daily  . donepezil  10 mg Oral QHS  . heparin  5,000 Units Subcutaneous Q8H  . insulin aspart  0-9 Units Subcutaneous TID WC  . levothyroxine  75 mcg Oral QAC breakfast   Continuous Infusions:   LOS: 0 days   Time spent: 25 minutes   Vance Gather, MD [facility] Pager [phone]  If 7PM-7AM, please contact night-coverage [email or web address] Password [email or web address] 03/23/2017, 3:39 PM

## 2017-03-23 NOTE — Progress Notes (Addendum)
TB skin test read on RFA and it is negative with 25mm induration.  Othella [facility]

## 2017-03-24 DIAGNOSIS — W19XXXA Unspecified fall, initial encounter: Secondary | ICD-10-CM | POA: Diagnosis not present

## 2017-03-24 DIAGNOSIS — N3000 Acute cystitis without hematuria: Secondary | ICD-10-CM | POA: Diagnosis not present

## 2017-03-24 LAB — GLUCOSE, CAPILLARY
GLUCOSE-CAPILLARY: 103 mg/dL — AB (ref 65–99)
GLUCOSE-CAPILLARY: 162 mg/dL — AB (ref 65–99)
GLUCOSE-CAPILLARY: 176 mg/dL — AB (ref 65–99)
Glucose-Capillary: 119 mg/dL — ABNORMAL HIGH (ref 65–99)

## 2017-03-24 NOTE — Progress Notes (Signed)
Report received from [place], [place]. Agree with previous RN's assessment. Will continue to monitor pt closely. Carnella [place] I

## 2017-03-24 NOTE — Progress Notes (Signed)
Patient ID: Robert Salinas., male   DOB: 03-18-1927, 83 y.o.   MRN: [id number]    PROGRESS NOTE  Robert Salinas.  [id number]:[id number] DOB: 11/14/1926 DOA: 03/20/2017  PCP: Robert Olp, MD   Brief Narrative:  Robert Mascio. is a 83 y.o. male with a history of alzheimer's dementia, hypothyroidism, remote AFib on ASA, well-controlled T2DM, and HTN who presented 10/2 due to AMS. He was found to have a UTI and dehydration. With treatment, mentation has cleared, though he remains with cognitive impairment.   Acute metabolic encephalopathy: On chronic cognitive impairment, having been on aricept in the past. Acute component is resolving with Tx of UTI. PO intake improving. Other work up unremarkable. TSH mildly elevated with normal T4, CT head with progressive and advancing severe and diffuse brain atrophy in frontal, temporal and occipital lobes noted. No indication for neurology or psychiatry consultations while inpatient.  - Monitor in memory care unit - Continue UTI Tx as below  Enterococcus UTI: Pan-sensitive, taking better po. No fever, leukocytosis.  - Switch to amoxicillin, dosed renally at 500mg  BID.  - Consider repeat UA to monitor hematuria as an outpatient (though this was noted on catheterized specimen)  Alzheimer dementia: SW has been involved with Adult Protective Services involvement due to several wandering episodes. - Delirium precautions - Consider outpatient neurocognitive evaluation.  - Will need supervision at discharge, plan to DC to [facility] memory care unit, though legal department there requires that a guardian check him in (not himself or his family). Thus, he will be hospitalized until 10/8 as DC elsewhere is unsafe.  Paroxysmal A Fib: Has maintained NSR.  - Continuing ASA (fall risk precludes anticoagulation).   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  - Hold [facility]. GFR borderline, so metformin not started.  - SSI as inpatient  AKI on CKD stage III: SCr 1.85 at admission, trending downward with IV fluids. No dedicated work up indicated given improvement.  - Monitor intermittently.  - Avoid nephrotoxins  Thrombocytopenia: Mild, chronic.  - Monitor - Continue ASA as long as platelets >50k.   HTN - Continue norvasc  HLD - Continue lipitor   Hypothyroidism: TSH 5.424 with normal free T4 (1.03) - Continue synthroid at current dose.   DVT prophylaxis: Heparin SQ Code Status: Full code Family Communication: Son at bedside Disposition Plan: [place] [facility] once APS obtains guardianship.   Consultants:   PT/OT  Procedures:   None  Antimicrobials:   Rocephin 10/03 --> 10/04  Unasyn 10/04 --> 10/05  Amoxicillin 10/05 --> 10/07  Subjective: Returning to mental baseline, still needing assistance. No fevers or pain.   Objective: BP 118/67 (BP Location: Left Arm)   Pulse 60   Temp 97.9 F (36.6 C) (Oral)   Resp 16   Ht 5\' 10"  (1.778 m)   Wt 81.6 kg (179 lb 14.3 oz)   SpO2 96%   BMI 25.81 kg/m   Gen: Elderly, pleasant 83 y.o.male in NAD.  HEENT: MMM, posterior oropharynx clear Pulm: Non-labored; CTAB, no wheezes  CV: Regular rate, no murmur appreciated; distal pulses intact/symmetric GI: + BS; soft, non-tender, non-distended Skin: No rashes, wounds, ulcers. Toenails extensively thickened and dystrophic. Scattered senile purpura without wounds.  Neuro: Alert, not oriented, No focal sensorimotor deficits. Able to feed himself with reminders to do so.   CBC:  Recent Labs Lab 03/20/17  1006 03/21/17 0406 03/22/17 0409 03/23/17 0402  WBC 6.5 6.7 5.9 4.4  NEUTROABS 4.8  --   --   --   HGB 15.0 15.4 16.0 14.2  HCT 44.8 45.9 45.9 42.9  MCV 95.5 97.2 96.0 97.1  PLT 147* 145* 126* 017*   Basic Metabolic Panel:  Recent Labs Lab 03/20/17 1006 03/21/17 0406 03/22/17 0409  03/23/17 0402  NA 141 142 140 141  K 4.3 4.3 4.6 4.4  CL 106 106 106 109  CO2 26 27 23 24   GLUCOSE 304* 146* 86 91  BUN 49* 44* 38* 30*  CREATININE 1.85* 1.69* 1.64* 1.44*  CALCIUM 8.9 8.9 8.4* 8.3*   Liver Function Tests:  Recent Labs Lab 03/20/17 1006  AST 47*  ALT 32  ALKPHOS 94  BILITOT 1.3*  PROT 6.5  ALBUMIN 3.7    Recent Labs Lab 03/20/17 1006  LIPASE 21   Coagulation Profile:  Recent Labs Lab 03/20/17 1006  INR 1.01   CBG:  Recent Labs Lab 03/23/17 1154 03/23/17 1642 03/23/17 2113 03/24/17 0810 03/24/17 1218  GLUCAP 105* 137* 142* 103* 162*   Urine analysis:    Component Value Date/Time   COLORURINE YELLOW 03/21/2017 1930   APPEARANCEUR CLOUDY (A) 03/21/2017 1930   LABSPEC 1.016 03/21/2017 1930   PHURINE 5.0 03/21/2017 1930   GLUCOSEU NEGATIVE 03/21/2017 1930   HGBUR LARGE (A) 03/21/2017 1930   BILIRUBINUR NEGATIVE 03/21/2017 1930   BILIRUBINUR moderate 02/09/2011 1031   KETONESUR NEGATIVE 03/21/2017 1930   PROTEINUR 100 (A) 03/21/2017 1930   UROBILINOGEN 0.2 02/09/2011 1031   UROBILINOGEN 1.0 12/21/2008 2206   NITRITE NEGATIVE 03/21/2017 1930   LEUKOCYTESUR LARGE (A) 03/21/2017 1930    Recent Results (from the past 240 hour(s))  Urine culture     Status: Abnormal   Collection Time: 03/20/17 12:31 PM  Result Value Ref Range Status   Specimen Description URINE, CATHETERIZED  Final   Special Requests NONE  Final   Culture >=100,000 COLONIES/mL ENTEROCOCCUS FAECALIS (A)  Final   Report Status 03/23/2017 FINAL  Final   Organism ID, Bacteria ENTEROCOCCUS FAECALIS (A)  Final      Susceptibility   Enterococcus faecalis - MIC*    AMPICILLIN <=2 SENSITIVE Sensitive     LEVOFLOXACIN 0.5 SENSITIVE Sensitive     NITROFURANTOIN <=16 SENSITIVE Sensitive     VANCOMYCIN <=0.5 SENSITIVE Sensitive     * >=100,000 COLONIES/mL ENTEROCOCCUS FAECALIS     Radiology Studies: No results found. Scheduled Meds: . amLODipine  2.5 mg Oral Daily  .  amoxicillin  500 mg Oral Q12H  . aspirin  81 mg Oral Daily  . atorvastatin  10 mg Oral Daily  . donepezil  10 mg Oral QHS  . heparin  5,000 Units Subcutaneous Q8H  . insulin aspart  0-9 Units Subcutaneous TID WC  . levothyroxine  75 mcg Oral QAC breakfast   Continuous Infusions:   LOS: 0 days   Time spent: 25 minutes   Vance Gather, MD [facility] Pager [phone]  If 7PM-7AM, please contact night-coverage [email or web address] Password [email or web address] 03/24/2017, 4:12 PM

## 2017-03-25 ENCOUNTER — Other Ambulatory Visit: Payer: Self-pay | Admitting: Family Medicine

## 2017-03-25 DIAGNOSIS — N3000 Acute cystitis without hematuria: Secondary | ICD-10-CM | POA: Diagnosis not present

## 2017-03-25 DIAGNOSIS — W19XXXA Unspecified fall, initial encounter: Secondary | ICD-10-CM | POA: Diagnosis not present

## 2017-03-25 LAB — GLUCOSE, CAPILLARY
GLUCOSE-CAPILLARY: 139 mg/dL — AB (ref 65–99)
Glucose-Capillary: 92 mg/dL (ref 65–99)
Glucose-Capillary: 156 mg/dL — ABNORMAL HIGH (ref 65–99)
Glucose-Capillary: 124 mg/dL — ABNORMAL HIGH (ref 65–99)

## 2017-03-25 NOTE — Progress Notes (Signed)
CSW met with patient and son, Robert Salinas, at bedside. Patient lethargic and sleeping most of meeting. CSW assessed payer source for planned discharge to [facility]. Son indicated payment would have to be made from patient's accounts. Son with questions about guardianship process with APS. Son indicated he and other family members are unable to take legal guardianship of patient. CSW explained that since family unable to, APS would be filing for guardianship. Son aware of APS case and discussed his conversations with APS worker. Son prefers patient to discharge to [facility] for rehab, but understanding of APS plan to discharge to [place]. CSW staffed case with supervisor. CSW to follow and support with disposition planning.  [name], [place]

## 2017-03-25 NOTE — Progress Notes (Signed)
Patient ID: Robert Colonel., male   DOB: 1926-12-21, 83 y.o.   MRN: [id number]    PROGRESS NOTE  Robert Colonel.  QJJ:[id number] DOB: 1927-02-17 DOA: 03/20/2017  PCP: Robert Olp, MD   Brief Narrative:  Robert Salinas. is a 83 y.o. male with a history of alzheimer's dementia, hypothyroidism, remote AFib on ASA, well-controlled T2DM, and HTN who presented 10/2 due to AMS. He was found to have a UTI and dehydration. With treatment, mentation has cleared, though he remains with cognitive impairment which is at his baseline.  Acute metabolic encephalopathy superimposed on chronic cognitive impairment: Has been on aricept in the past. Acute component resolved with Tx of UTI. PO intake improving. Other work up unremarkable. TSH mildly elevated with normal T4, CT head with progressive and advancing severe and diffuse brain atrophy in frontal, temporal and occipital lobes noted. No indication for neurology or psychiatry consultations while inpatient.  - Resolved. Monitor in memory care unit - Continue UTI Tx as below  Enterococcus UTI: Pan-sensitive, taking better po. No fever, leukocytosis.  - Switch to amoxicillin, dosed renally at 500mg  BID.  - Consider repeat UA as outpatient to monitor hematuria as an outpatient (though this was noted on catheterized specimen)  Alzheimer dementia: SW has been involved with Adult Protective Services involvement due to several wandering episodes. - Delirium precautions - Consider outpatient neurocognitive evaluation.  - Will need supervision at discharge, plan to DC to [facility] memory care unit, though legal department there requires that a guardian check him in (not himself or his family). Thus, he will be hospitalized until 10/8 as DC elsewhere is unsafe.  Paroxysmal A Fib: Has maintained NSR.  - Continuing ASA (fall risk precludes anticoagulation).   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  - Hold [facility]. GFR borderline, so metformin not started.  - SSI as inpatient  AKI on CKD stage III: SCr 1.85 at admission, trending downward with IV fluids. No dedicated work up indicated given improvement.  - Monitor intermittently.  - Avoid nephrotoxins  Thrombocytopenia: Mild, chronic.  - Monitor - Continue ASA as long as platelets >50k.   HTN - Continue norvasc  HLD - Continue lipitor   Hypothyroidism: TSH 5.424 with normal free T4 (1.03) - Continue synthroid at current dose.   DVT prophylaxis: Heparin SQ Code Status: Full code Family Communication: Robert Salinas at bedside Disposition Plan: [place] [facility] once APS obtains guardianship.   Consultants:   PT/OT  Procedures:   None  Antimicrobials:   Rocephin 10/03 --> 10/04  Unasyn 10/04 --> 10/05  Amoxicillin 10/05 --> 10/07  Subjective: Returned to mental baseline. No fevers or pain.   Objective: BP 104/68   Pulse 73   Temp 97.9 F (36.6 C) (Oral)   Resp 16   Ht 5\' 10"  (1.778 m)   Wt 81.6 kg (179 lb 14.3 oz)   SpO2 99%   BMI 25.81 kg/m   Gen: Elderly, pleasant 83 y.o.male in NAD.  HEENT: MMM, posterior oropharynx clear Pulm: Non-labored; CTAB, no wheezes  CV: Regular rate, no murmur appreciated; distal pulses intact/symmetric GI: + BS; soft, non-tender, non-distended Skin: No rashes, wounds, ulcers. Toenails extensively thickened and dystrophic without erythema. Scattered senile purpura resolving. No ulcerations.  Neuro: Alert, not oriented, misidentifies Robert Salinas as Robert Salinas. No focal sensorimotor deficits.  CBC:  Recent Labs Lab 03/20/17 1006 03/21/17 0406  03/22/17 0409 03/23/17 0402  WBC 6.5 6.7 5.9 4.4  NEUTROABS 4.8  --   --   --   HGB 15.0 15.4 16.0 14.2  HCT 44.8 45.9 45.9 42.9  MCV 95.5 97.2 96.0 97.1  PLT 147* 145* 126* 017*   Basic Metabolic Panel:  Recent Labs Lab 03/20/17 1006 03/21/17 0406 03/22/17 0409  03/23/17 0402  NA 141 142 140 141  K 4.3 4.3 4.6 4.4  CL 106 106 106 109  CO2 26 27 23 24   GLUCOSE 304* 146* 86 91  BUN 49* 44* 38* 30*  CREATININE 1.85* 1.69* 1.64* 1.44*  CALCIUM 8.9 8.9 8.4* 8.3*   Liver Function Tests:  Recent Labs Lab 03/20/17 1006  AST 47*  ALT 32  ALKPHOS 94  BILITOT 1.3*  PROT 6.5  ALBUMIN 3.7   CBG:  Recent Labs Lab 03/24/17 1218 03/24/17 1630 03/24/17 2202 03/25/17 0733 03/25/17 1133  GLUCAP 162* 176* 119* 92 156*   Urine analysis:    Component Value Date/Time   COLORURINE YELLOW 03/21/2017 1930   APPEARANCEUR CLOUDY (A) 03/21/2017 1930   LABSPEC 1.016 03/21/2017 1930   PHURINE 5.0 03/21/2017 1930   GLUCOSEU NEGATIVE 03/21/2017 1930   HGBUR LARGE (A) 03/21/2017 1930   BILIRUBINUR NEGATIVE 03/21/2017 1930   BILIRUBINUR moderate 02/09/2011 1031   KETONESUR NEGATIVE 03/21/2017 1930   PROTEINUR 100 (A) 03/21/2017 1930   UROBILINOGEN 0.2 02/09/2011 1031   UROBILINOGEN 1.0 12/21/2008 2206   NITRITE NEGATIVE 03/21/2017 1930   LEUKOCYTESUR LARGE (A) 03/21/2017 1930    Recent Results (from the past 240 hour(s))  Urine culture     Status: Abnormal   Collection Time: 03/20/17 12:31 PM  Result Value Ref Range Status   Specimen Description URINE, CATHETERIZED  Final   Special Requests NONE  Final   Culture >=100,000 COLONIES/mL ENTEROCOCCUS FAECALIS (A)  Final   Report Status 03/23/2017 FINAL  Final   Organism ID, Bacteria ENTEROCOCCUS FAECALIS (A)  Final      Susceptibility   Enterococcus faecalis - MIC*    AMPICILLIN <=2 SENSITIVE Sensitive     LEVOFLOXACIN 0.5 SENSITIVE Sensitive     NITROFURANTOIN <=16 SENSITIVE Sensitive     VANCOMYCIN <=0.5 SENSITIVE Sensitive     * >=100,000 COLONIES/mL ENTEROCOCCUS FAECALIS     Time spent: 15 minutes   Robert Gather, MD [facility] Pager [phone]  If 7PM-7AM, please contact night-coverage [email or web address] Password [email or web address] 03/25/2017, 1:53 PM

## 2017-03-26 ENCOUNTER — Telehealth: Payer: Self-pay | Admitting: Family Medicine

## 2017-03-26 DIAGNOSIS — E119 Type 2 diabetes mellitus without complications: Secondary | ICD-10-CM

## 2017-03-26 DIAGNOSIS — W19XXXA Unspecified fall, initial encounter: Secondary | ICD-10-CM | POA: Diagnosis not present

## 2017-03-26 DIAGNOSIS — N3000 Acute cystitis without hematuria: Secondary | ICD-10-CM | POA: Diagnosis not present

## 2017-03-26 LAB — GLUCOSE, CAPILLARY
Glucose-Capillary: 95 mg/dL (ref 65–99)
Glucose-Capillary: 115 mg/dL — ABNORMAL HIGH (ref 65–99)

## 2017-03-26 MED ORDER — GLIMEPIRIDE 4 MG PO TABS: 4.0000 mg | ORAL_TABLET | Freq: Every day | ORAL | Status: DC

## 2017-03-26 NOTE — Progress Notes (Signed)
Report called to [facility]. Robert Salinas

## 2017-03-26 NOTE — Telephone Encounter (Signed)
[facility] assisted [facility] called in reference to needing to know if patient needed finger sticks daily. Stepdaughter is currently there and if needed she is going to pick up the glucometer before leaving. Please call and advise.

## 2017-03-26 NOTE — Progress Notes (Signed)
PROGRESS NOTE  Robert Salinas.  OMV:[id number] DOB: [birth year] DOA: 03/20/2017  PCP: [name], MD   Brief Narrative:  Robert Salinas. is a 83 y.o. male with a history of alzheimer's dementia, hypothyroidism, remote AFib on ASA, well-controlled T2DM, and HTN who presented 10/2 due to AMS. He was found to have a UTI and dehydration. With treatment, mentation has cleared, though he remains with cognitive impairment which is at his baseline.  Subjective: No concerns. Eating well.  Objective: BP (!) 109/57 (BP Location: Left Arm)   Pulse (!) 57   Temp 98.6 F (37 C) (Oral)   Resp 20   Ht 5\' 10"  (1.778 m)   Wt 81.6 kg (179 lb 14.3 oz)   SpO2 98%   BMI 25.81 kg/m   Gen: Elderly, pleasant 83 y.o.male in NAD.  HEENT: MMM, posterior oropharynx clear Pulm: Non-labored; CTAB, no wheezes  CV: Regular rate, no murmur appreciated; distal pulses intact/symmetric GI: + BS; soft, non-tender, non-distended Skin: Scattered senile purpura resolving. No ulcerations.  Neuro: Alert, not oriented. No focal sensorimotor deficits.  Assessment & Plan: Acute metabolic encephalopathy superimposed on chronic cognitive impairment: Has been on aricept in the past. Acute component resolved with Tx of UTI. PO intake improving. Other work up unremarkable. TSH mildly elevated with normal T4, CT head with progressive and advancing severe and diffuse brain atrophy in frontal, temporal and occipital lobes noted. No indication for neurology or psychiatry consultations while inpatient.  - Resolved. Monitor in memory care unit - Continue UTI Tx as below  Enterococcus UTI: Pan-sensitive, taking better po. No fever, leukocytosis.  - Completed culture-guided therapy 10/8.  Rocephin 10/03 --> 10/04 Unasyn 10/04 --> 10/05 Amoxicillin 10/05 --> 10/08 - Consider repeat UA as outpatient to monitor hematuria as an outpatient (though this was noted on catheterized specimen)  Alzheimer dementia: SW has been  involved with Adult Protective Services involvement due to several wandering episodes. - Delirium precautions - Consider outpatient neurocognitive evaluation.  - Will need supervision at discharge, plan to DC to [name], though legal department there requires that a guardian (i.e. APS) check him in (not himself or his family).  Paroxysmal A Fib: Has maintained NSR.  - Continuing ASA (fall risk precludes anticoagulation).   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  - Has been at inpatient goal requiring very little insulin, will DC insulin and restart [facility] and monitor.  AKI on CKD stage III: SCr 1.85 at admission, trending downward with IV fluids. No dedicated work up indicated given improvement.  - Monitor intermittently.  - Avoid nephrotoxins  Thrombocytopenia: Mild, chronic.  - Continue ASA as long as platelets >50k.   HTN - Continue norvasc  HLD - Continue lipitor   Hypothyroidism: TSH 5.424 with normal free T4 (1.03) - Continue synthroid at current dose.   Family Communication: None at bedside Disposition Plan: Patient no longer requires inpatient treatment. [name] once APS obtains guardianship.   Robert Gather, MD [facility] Pager [phone]  If 7PM-7AM, please contact night-coverage [email or web address] Password [email or web address] 03/26/2017, 12:48 PM

## 2017-03-26 NOTE — Discharge Summary (Signed)
Physician Discharge Summary  Robert Salinas. UXL:[id number] DOB: 09/23/1926 DOA: 03/20/2017  PCP: Robert Olp, MD  Admit date: 03/20/2017 Discharge date: 03/26/2017  Admitted From: Home Disposition: ALF Memory Care   Recommendations for Outpatient Follow-up:  1. Follow up as needed 2. Please obtain BMP/CBC in one week  Home Health: N/A Equipment/Devices: N/A Discharge Condition: Stable CODE STATUS: Full Diet recommendation: Carbohydrate limited  Brief/Interim Summary: Robert Salinas. is a 83 y.o. male with a history of alzheimer's dementia, hypothyroidism, remote AFib on ASA, well-controlled T2DM, and HTN who presented 10/2 due to AMS. He was found to have a UTI and dehydration. With treatment, mentation has cleared, though he remains with cognitive impairment which is at his baseline.  Discharge Diagnoses:  Active Problems:   UTI (urinary tract infection)  Acute metabolic encephalopathy superimposed on chronic cognitive impairment: Has been on aricept in the past. Acute component resolved with Tx of UTI. PO intake improving. Other work up unremarkable. TSH mildly elevated with normal T4, CT head with progressive and advancing severe and diffuse brain atrophy in frontal, temporal and occipital lobes noted. No indication for neurology or psychiatry consultations while inpatient.  - Resolved. Monitor in memory care unit - Continue UTI Tx as below  Enterococcus UTI: Pan-sensitive, taking better po. No fever, leukocytosis.  - Completed antibiotic therapy targeted with culture data. - Consider repeat UA as outpatient to monitor hematuria as an outpatient (though this was noted on catheterized specimen)  Alzheimer dementia: SW has been involved with Adult Protective Services involvement due to several wandering episodes. - Delirium precautions - Continue aricept. Consider outpatient neurocognitive evaluation. - Will need supervision at discharge, plan to DC to [facility]  memory care unit, though legal department there requires that a guardian (i.e. APS) check him in (not himself or his family).  Paroxysmal A Fib: Has maintained NSR.  - Continuing ASA (fall risk precludes anticoagulation).   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  - Has been at inpatient goal requiring very little insulin, will DC insulin and restart [facility] and monitor.  AKI on CKD stage III: SCr 1.85 at admission, trending downward with IV fluids. No dedicated work up indicated given improvement.  - Monitor intermittently.  - Avoid nephrotoxins  Thrombocytopenia: Mild, chronic.  - Monitor - Continue ASA as long as platelets >50k.   HTN - Continue norvasc  HLD - Continue lipitor   Hypothyroidism: TSH 5.424 with normal free T4 (1.03) - Continue synthroid at current dose.   Discharge Instructions Discharge Instructions    Diet Carb Modified    Complete by:  As directed      Allergies as of 03/26/2017   No Known Allergies     Medication List    TAKE these medications   amLODipine 2.5 MG tablet Commonly known as:  NORVASC Take 1 tablet (2.5 mg total) by mouth daily.   aspirin 81 MG tablet Take 1 tablet (81 mg total) by mouth daily.   atorvastatin 10 MG tablet Commonly known as:  LIPITOR TAKE 1 TABLET DAILY   donepezil 10 MG tablet Commonly known as:  ARICEPT TAKE 1 TABLET AT BEDTIME   glimepiride 4 MG tablet Commonly known as:  AMARYL TAKE 1 TABLET DAILY WITH BREAKFAST What changed:  See the new instructions.   glucose blood test strip Commonly known as:  ONETOUCH VERIO Use to test blood sugars daily.  Dx: E11.9   levothyroxine 75 MCG tablet Commonly known as:  SYNTHROID, LEVOTHROID TAKE 1 TABLET DAILY BEFORE BREAKFAST   naproxen sodium 220 MG tablet Commonly known as:  ANAPROX Take 220 mg by mouth daily as needed (used once for sleep).    onetouch ultrasoft lancets Use to test blood sugars daily. Dx: E11.9      Follow-up Information    Robert Olp, MD Follow up.   Specialty:  Family Medicine Contact information: [place] [phone]          No Known Allergies  Consultations:  None  Procedures/Studies: Dg Chest 2 View  Result Date: 03/20/2017 CLINICAL DATA:  Unwitnessed fall EXAM: CHEST  2 VIEW COMPARISON:  PA and lateral chest x-ray of February 21, 2017 FINDINGS: The lungs are mildly hypoinflated but clear. The heart and pulmonary vascularity are normal. The mediastinum is normal in width. There is calcification in the wall of the aortic arch. There is mild multilevel degenerative disc disease of the thoracic spine. IMPRESSION: There is no acute cardiopulmonary abnormality. Stable hypoinflation accentuated by kyphosis. Thoracic aortic atherosclerosis. Electronically Signed   By: David  Martinique M.D.   On: 03/20/2017 09:40   Ct Head Wo Contrast  Result Date: 03/20/2017 CLINICAL DATA:  Unwitnessed fall. EXAM: CT HEAD WITHOUT CONTRAST CT CERVICAL SPINE WITHOUT CONTRAST TECHNIQUE: Multidetector CT imaging of the head and cervical spine was performed following the standard protocol without intravenous contrast. Multiplanar CT image reconstructions of the cervical spine were also generated. COMPARISON:  02/21/2017 FINDINGS: CT HEAD FINDINGS Brain: There is no evidence for acute hemorrhage, hydrocephalus, mass lesion, or abnormal extra-axial fluid collection. No definite CT evidence for acute infarction. Diffuse loss of parenchymal volume is consistent with atrophy. Patchy low attenuation in the deep hemispheric and periventricular white matter is nonspecific, but likely reflects chronic microvascular ischemic demyelination. Vascular: No hyperdense vessel or unexpected calcification. Skull: No evidence for fracture. No worrisome lytic or sclerotic lesion. Sinuses/Orbits: The visualized  paranasal sinuses and mastoid air cells are clear. Visualized portions of the globes and intraorbital fat are unremarkable. Other: None. CT CERVICAL SPINE FINDINGS Alignment: Slight accentuation normal cervical lordosis. Skull base and vertebrae: No acute fracture. No primary bone lesion or focal pathologic process. Soft tissues and spinal canal: No prevertebral fluid or swelling. No visible canal hematoma. Disc levels: Loss of disc height at C6-7 and to lesser degree at C5-6 is associated with endplate degeneration. Multilevel facet osteoarthritis is evident bilaterally. Upper chest: Negative. Other: None. IMPRESSION: 1. No acute intracranial abnormality. 2. Atrophy with chronic small vessel white matter ischemic disease. 3. Degenerative changes in the cervical spine without acute fracture. Electronically Signed   By: Misty Stanley M.D.   On: 03/20/2017 10:04   Ct Cervical Spine Wo Contrast  Result Date: 03/20/2017 CLINICAL DATA:  Unwitnessed fall. EXAM: CT HEAD WITHOUT CONTRAST CT CERVICAL SPINE WITHOUT CONTRAST TECHNIQUE: Multidetector CT imaging of the head and cervical spine was performed following the standard protocol without intravenous contrast. Multiplanar CT image reconstructions of the cervical spine were also generated. COMPARISON:  02/21/2017 FINDINGS: CT HEAD FINDINGS Brain: There is no evidence for acute hemorrhage, hydrocephalus, mass lesion, or abnormal extra-axial fluid collection. No definite CT evidence for acute infarction. Diffuse loss of parenchymal volume is consistent with atrophy. Patchy low attenuation in the deep hemispheric and periventricular white matter is nonspecific, but likely reflects chronic microvascular ischemic demyelination. Vascular: No hyperdense vessel or unexpected calcification. Skull: No evidence for fracture.  No worrisome lytic or sclerotic lesion. Sinuses/Orbits: The visualized paranasal sinuses and mastoid air cells are clear. Visualized portions of the globes  and intraorbital fat are unremarkable. Other: None. CT CERVICAL SPINE FINDINGS Alignment: Slight accentuation normal cervical lordosis. Skull base and vertebrae: No acute fracture. No primary bone lesion or focal pathologic process. Soft tissues and spinal canal: No prevertebral fluid or swelling. No visible canal hematoma. Disc levels: Loss of disc height at C6-7 and to lesser degree at C5-6 is associated with endplate degeneration. Multilevel facet osteoarthritis is evident bilaterally. Upper chest: Negative. Other: None. IMPRESSION: 1. No acute intracranial abnormality. 2. Atrophy with chronic small vessel white matter ischemic disease. 3. Degenerative changes in the cervical spine without acute fracture. Electronically Signed   By: Misty Stanley M.D.   On: 03/20/2017 10:04     Subjective: Feels well, no complaints.   Discharge Exam: Vitals:   03/25/17 2024 03/26/17 0438  BP: 127/65 (!) 109/57  Pulse: 63 (!) 57  Resp: 18 20  Temp: 98.3 F (36.8 C) 98.6 F (37 C)  SpO2: 100% 98%   Gen: Elderly, pleasant 83 y.o.male in NAD.  HEENT: MMM, posterior oropharynx clear Pulm: Non-labored; CTAB, no wheezes  CV: Regular rate, no murmur appreciated; distal pulses intact/symmetric GI: + BS; soft, non-tender, non-distended Skin: Scattered senile purpura resolving. No ulcerations.  Neuro: Alert, not oriented. No focal sensorimotor deficits.  Labs: Basic Metabolic Panel:  Recent Labs Lab 03/20/17 1006 03/21/17 0406 03/22/17 0409 03/23/17 0402  NA 141 142 140 141  K 4.3 4.3 4.6 4.4  CL 106 106 106 109  CO2 26 27 23 24   GLUCOSE 304* 146* 86 91  BUN 49* 44* 38* 30*  CREATININE 1.85* 1.69* 1.64* 1.44*  CALCIUM 8.9 8.9 8.4* 8.3*   Liver Function Tests:  Recent Labs Lab 03/20/17 1006  AST 47*  ALT 32  ALKPHOS 94  BILITOT 1.3*  PROT 6.5  ALBUMIN 3.7    Recent Labs Lab 03/20/17 1006  LIPASE 21   CBC:  Recent Labs Lab 03/20/17 1006 03/21/17 0406 03/22/17 0409  03/23/17 0402  WBC 6.5 6.7 5.9 4.4  NEUTROABS 4.8  --   --   --   HGB 15.0 15.4 16.0 14.2  HCT 44.8 45.9 45.9 42.9  MCV 95.5 97.2 96.0 97.1  PLT 147* 145* 126* 143*   CBG:  Recent Labs Lab 03/25/17 1133 03/25/17 1649 03/25/17 2305 03/26/17 0717 03/26/17 1156  GLUCAP 156* 124* 139* 95 115*   Urinalysis    Component Value Date/Time   COLORURINE YELLOW 03/21/2017 1930   APPEARANCEUR CLOUDY (A) 03/21/2017 1930   LABSPEC 1.016 03/21/2017 1930   PHURINE 5.0 03/21/2017 1930   GLUCOSEU NEGATIVE 03/21/2017 1930   HGBUR LARGE (A) 03/21/2017 1930   BILIRUBINUR NEGATIVE 03/21/2017 1930   BILIRUBINUR moderate 02/09/2011 1031   KETONESUR NEGATIVE 03/21/2017 1930   PROTEINUR 100 (A) 03/21/2017 1930   UROBILINOGEN 0.2 02/09/2011 1031   UROBILINOGEN 1.0 12/21/2008 2206   NITRITE NEGATIVE 03/21/2017 1930   LEUKOCYTESUR LARGE (A) 03/21/2017 1930    Microbiology Recent Results (from the past 240 hour(s))  Urine culture     Status: Abnormal   Collection Time: 03/20/17 12:31 PM  Result Value Ref Range Status   Specimen Description URINE, CATHETERIZED  Final   Special Requests NONE  Final   Culture >=100,000 COLONIES/mL ENTEROCOCCUS FAECALIS (A)  Final   Report Status 03/23/2017 FINAL  Final   Organism ID, Bacteria ENTEROCOCCUS FAECALIS (A)  Final  Susceptibility   Enterococcus faecalis - MIC*    AMPICILLIN <=2 SENSITIVE Sensitive     LEVOFLOXACIN 0.5 SENSITIVE Sensitive     NITROFURANTOIN <=16 SENSITIVE Sensitive     VANCOMYCIN <=0.5 SENSITIVE Sensitive     * >=100,000 COLONIES/mL ENTEROCOCCUS FAECALIS    Time coordinating discharge: Approximately 40 minutes  Vance Gather, MD  [facility] 03/26/2017, 1:55 PM Pager [phone]

## 2017-03-26 NOTE — Progress Notes (Signed)
Occupational Therapy Treatment Patient Details Name: Robert Salinas. MRN: [id number] DOB: [birth year] Today's Date: 03/26/2017    History of present illness 83 yo male admitted with UTI, fall. Hx of Alz, A fib, DM, HTN, hypothyroidism   OT comments  Pt progressing towards goals, sat EOB to complete grooming ADLs with setup assist and minguard for safety with static sitting balance. Pt completed sit<>stand with ModA +2 at RW, completed side steps along bedside with MinA +2 prior to return to sitting. Pt continues to require increased assist for LB dressing and multimodal cues to complete tasks. POC remains appropriate at this time. Will continue to follow acutely.    Follow Up Recommendations  SNF;Supervision/Assistance - 24 hour    Equipment Recommendations  Other (comment) (defer to next venue )          Precautions / Restrictions Precautions Precautions: Fall Restrictions Weight Bearing Restrictions: No       Mobility Bed Mobility Overal bed mobility: Needs Assistance Bed Mobility: Supine to Sit;Sit to Supine     Supine to sit: Min guard;HOB elevated Sit to supine: HOB elevated;Min guard   General bed mobility comments: Increased time. Cues for completion of task.   Transfers Overall transfer level: Needs assistance Equipment used: Rolling walker (2 wheeled) Transfers: Sit to/from Stand Sit to Stand: Mod assist;From elevated surface;+2 physical assistance;+2 safety/equipment         General transfer comment: assist to rise, stabilize, control descent; verbal cues for sequencing, safety, hand placement     Balance Overall balance assessment: Needs assistance Sitting-balance support: Feet supported Sitting balance-Leahy Scale: Fair     Standing balance support: Bilateral upper extremity supported Standing balance-Leahy Scale: Poor                             ADL either performed or assessed with clinical judgement   ADL Overall ADL's : Needs  assistance/impaired     Grooming: Set up;Sitting;Min guard;Wash/dry face;Oral care Grooming Details (indicate cue type and reason): Pt completed seated grooming ADLs sitting EOB with close guard for safety during static sitting balance              Lower Body Dressing: Total assistance;Sit to/from stand Lower Body Dressing Details (indicate cue type and reason): total assist to doff socks this session seated EOB              Functional mobility during ADLs: Maximal assistance;+2 for safety/equipment;Rolling walker General ADL Comments: Pt sat EOB approx 7-10 min with close guard for safety while Pt completed seated grooming ADLs, completed sit<>stand with Mod-MaxA +2 at RW and side steps along EOB with MinA +2 prior to return to sitting/supine in bed                        Cognition Arousal/Alertness: Awake/alert Behavior During Therapy: WFL for tasks assessed/performed Overall Cognitive Status: History of cognitive impairments - at baseline                                                            Pertinent Vitals/ Pain       Pain Assessment: Faces Faces Pain Scale: No hurt  Frequency  Min 2X/week        Progress Toward Goals  OT Goals(current goals can now be found in the care plan section)  Progress towards OT goals: Progressing toward goals  Acute Rehab OT Goals Patient Stated Goal: none stated OT Goal Formulation: With patient Time For Goal Achievement: 04/04/17 Potential to Achieve Goals: Good  Plan Discharge plan remains appropriate                     AM-PAC PT "6 Clicks" Daily Activity     Outcome Measure   Help from another person eating meals?: A Little Help from another person taking care of personal grooming?: A Little Help from another person toileting, which includes using toliet, bedpan, or urinal?: A Lot Help from  another person bathing (including washing, rinsing, drying)?: A Lot Help from another person to put on and taking off regular upper body clothing?: A Little Help from another person to put on and taking off regular lower body clothing?: A Lot 6 Click Score: 15    End of Session Equipment Utilized During Treatment: Rolling walker  OT Visit Diagnosis: Unsteadiness on feet (R26.81);History of falling (Z91.81);Muscle weakness (generalized) (M62.81)   Activity Tolerance Patient tolerated treatment well   Patient Left in bed;with call bell/phone within reach;with nursing/sitter in room;with bed alarm set   Nurse Communication Mobility status    Functional Assessment Tool Used: AM-PAC 6 Clicks Daily Activity;Clinical judgement Functional Limitation: Self care Self Care Current Status (W6568): At least 60 percent but less than 80 percent impaired, limited or restricted Self Care Goal Status (L2751): At least 20 percent but less than 40 percent impaired, limited or restricted   Time: 1356-1417 OT Time Calculation (min): 21 min  Charges: OT G-codes **NOT FOR INPATIENT CLASS** Functional Assessment Tool Used: AM-PAC 6 Clicks Daily Activity;Clinical judgement Functional Limitation: Self care Self Care Current Status (Z0017): At least 60 percent but less than 80 percent impaired, limited or restricted Self Care Goal Status (C9449): At least 20 percent but less than 40 percent impaired, limited or restricted OT General Charges $OT Visit: 1 Visit OT Treatments $Self Care/Home Management : 8-22 mins  Lou Cal, [place] Pager [phone] 03/26/2017    Raymondo Band 03/26/2017, 3:19 PM

## 2017-03-26 NOTE — Progress Notes (Signed)
CSW notified by APS worker that the disposition plan continues to be [facility]. APS worker reported that they are filing for a protective order for patient and that they will provide to CSW once received.  CSW spoke with patient's son at bedside who confirmed patient's plan to discharge to [facility].   CSW received protective order from [place] worker. CSW placed on patient's chart.   CSW spoke with staff member Albina Billet from [facility] who confirmed patient's bed offer.   CSW provided requested clinical documents. Staff confirmed receipt and patient's ability to dc to facility.   CSW spoke with patient's APS worker who confirmed that patient will need PTAR for transport.   PTAR contacted, facility aware and patient's APS worker Morey [employer]) updated. Patient's RN provided with number to call report, packet complete.  CSW signing off, no other needs identified at this time.   Abundio Miu, [place] Social Worker [facility] Cell#: [phone]

## 2017-03-26 NOTE — Telephone Encounter (Signed)
I think checking CBGs daily is reasonable and then prn for concern of hypoglycemia

## 2017-03-28 ENCOUNTER — Telehealth: Payer: Self-pay | Admitting: Family Medicine

## 2017-03-28 NOTE — Telephone Encounter (Signed)
Robert Salinas from [facility] called to get a verbal order for the start of care for physical therapy to start tomorrow 03/29/17. Call to give order.

## 2017-03-29 NOTE — Telephone Encounter (Signed)
Called and spoke with nurse Rip Harbour and provided verbal orders

## 2017-03-29 NOTE — Telephone Encounter (Signed)
Called and provided order for physical care to start. They will evaluate and request more orders after evaluation.

## 2017-03-30 NOTE — Telephone Encounter (Signed)
Amy from [facility] called to get verbal orders for PT for 1x week 1 week  2x week 3 weeks 1x week 1 weeks  Also, blood sugar checks are supposed to be done daily, family has not provided the glucometer. Social Worker has been contact but, at this time the blood sugars are not being checked.   Call [phone] to advise.

## 2017-03-30 NOTE — Telephone Encounter (Signed)
Okay for verbal orders. 

## 2017-03-30 NOTE — Telephone Encounter (Signed)
Yes thanks, can provide verbals

## 2017-04-03 ENCOUNTER — Telehealth: Payer: Self-pay | Admitting: Family Medicine

## 2017-04-03 ENCOUNTER — Ambulatory Visit: Payer: Medicare Other | Admitting: Family Medicine

## 2017-04-03 NOTE — Telephone Encounter (Signed)
Michelle with Home Health calling about patient. States she needs verbal orders for home occupational therapy 2x a week for 3 weeks, and 1x a week for 1 week. Callback # [phone]

## 2017-04-03 NOTE — Telephone Encounter (Signed)
Called and left a voicemail providing verbal order for home occupational therapy

## 2017-04-03 NOTE — Telephone Encounter (Signed)
Called and left a detailed voicemail message providing verbal orders and a call back number for questions

## 2017-04-10 ENCOUNTER — Ambulatory Visit (INDEPENDENT_AMBULATORY_CARE_PROVIDER_SITE_OTHER): Payer: Medicare Other | Admitting: Family Medicine

## 2017-04-10 ENCOUNTER — Encounter: Payer: Self-pay | Admitting: Family Medicine

## 2017-04-10 VITALS — BP 118/66 | HR 62 | Ht 70.0 in | Wt 185.8 lb

## 2017-04-10 DIAGNOSIS — I10 Essential (primary) hypertension: Secondary | ICD-10-CM | POA: Diagnosis not present

## 2017-04-10 DIAGNOSIS — R319 Hematuria, unspecified: Secondary | ICD-10-CM

## 2017-04-10 DIAGNOSIS — E034 Atrophy of thyroid (acquired): Secondary | ICD-10-CM | POA: Diagnosis not present

## 2017-04-10 DIAGNOSIS — E119 Type 2 diabetes mellitus without complications: Secondary | ICD-10-CM | POA: Diagnosis not present

## 2017-04-10 LAB — POC URINALSYSI DIPSTICK (AUTOMATED)
BILIRUBIN UA: NEGATIVE
Blood, UA: NEGATIVE
GLUCOSE UA: NEGATIVE
KETONES UA: NEGATIVE
NITRITE UA: NEGATIVE
PH UA: 6 (ref 5.0–8.0)
Protein, UA: 30
Spec Grav, UA: >=1.03 — AB (ref 1.010–1.025)
Urobilinogen, UA: 0.2 E.U./dL

## 2017-04-10 MED ORDER — METFORMIN HCL 500 MG PO TABS: 250.0000 mg | ORAL_TABLET | Freq: Two times a day (BID) | ORAL | 3 refills | Status: DC

## 2017-04-10 NOTE — Patient Instructions (Signed)
Drop off urine before you leave  Stop glimepiride 4mg   Start metformin 500mg  tab- take half tablet twice a day with meals  This change can reduce risk of getting a low blood sugar

## 2017-04-10 NOTE — Assessment & Plan Note (Signed)
S: controlled on amlodipine 2.5mg  (down from 5mg ) .  BP Readings from Last 3 Encounters:  04/10/17 118/66  03/26/17 (!) 109/57  03/15/17 100/64  A/P: We discussed blood pressure goal of <140/90. Continue current meds

## 2017-04-10 NOTE — Assessment & Plan Note (Signed)
S:  controlled. On glimepiride 4 mg Lab Results  Component Value Date   HGBA1C 7.1 (H) 03/15/2017   HGBA1C 6.4 03/03/2016   HGBA1C 10.3 10/27/2015    A/P: Due to his dementia, we will stop glimepiride 4 mg with hypoglycemia risk.  GFR has remained over 30-we will start metformin 250 mg twice a day.  If has issues with this may trial once a day after a week break.

## 2017-04-10 NOTE — Assessment & Plan Note (Signed)
TSH is slightly off in the hospital.  He was ill at the time-we will repeat with future labs

## 2017-04-10 NOTE — Progress Notes (Signed)
Subjective:  Robert Salinas. is a 83 y.o. year old very pleasant male patient who presents for/with See problem oriented charting ROS-denies polyuria or dysuria.  No fever or chills.  Past Medical History-  Patient Active Problem List   Diagnosis Date Noted  . DNR (do not resuscitate) 12/08/2013    Priority: High  . CKD (chronic kidney disease), stage III ([place]) 02/21/2011    Priority: High  . Diabetes mellitus type II, controlled ([place]) 06/09/2009    Priority: High  . ALZHEIMER'S DISEASE, EARLY 06/09/2009    Priority: High  . Atrial fibrillation ([place]) 06/09/2009    Priority: High  . Hypothyroid 10/09/2011    Priority: Medium  . Hyperlipidemia 06/09/2009    Priority: Medium  . Essential hypertension 06/09/2009    Priority: Medium  . UTI (urinary tract infection) 03/20/2017    Medications- reviewed and updated Current Outpatient Prescriptions  Medication Sig Dispense Refill  . amLODipine (NORVASC) 2.5 MG tablet Take 1 tablet (2.5 mg total) by mouth daily. 90 tablet 3  . aspirin 81 MG tablet Take 1 tablet (81 mg total) by mouth daily. 1 tablet   . atorvastatin (LIPITOR) 10 MG tablet TAKE 1 TABLET DAILY 90 tablet 2  . donepezil (ARICEPT) 10 MG tablet TAKE 1 TABLET AT BEDTIME 90 tablet 3  . glimepiride (AMARYL) 4 MG tablet TAKE 1 TABLET DAILY WITH BREAKFAST 90 tablet 3  . glucose blood (ONETOUCH VERIO) test strip Use to test blood sugars daily. Dx: E11.9 100 each 12  . Lancets (ONETOUCH ULTRASOFT) lancets Use to test blood sugars daily. Dx: E11.9 100 each 12  . levothyroxine (SYNTHROID, LEVOTHROID) 75 MCG tablet TAKE 1 TABLET DAILY BEFORE BREAKFAST 90 tablet 3   No current facility-administered medications for this visit.    Objective: BP 118/66 (BP Location: Left Arm, Patient Position: Sitting, Cuff Size: Large)   Pulse 62   Ht 5\' 10"  (1.778 m)   Wt 185 lb 12.8 oz (84.3 kg)   SpO2 100%   BMI 26.66 kg/m  Gen: NAD, resting comfortably CV: RRR no murmurs rubs or  gallops Lungs: CTAB no crackles, wheeze, rhonchi Abdomen: soft/nontender including suprapubic/nondistended/normal bowel sounds. No rebound or guarding.  Ext: no edema Skin: warm, dry Pleasantly confused  Assessment/Plan:  At visit with [facility] staff member.  No family present  Hematuria S:  hospitalized for 6 days through 03/26/17 for dehydration and UTI. Symptoms resolved with antibiotic treatment- pan sensitive enterococcus. Did have some hematuria so outpatient UA was encouraged.  A/P: UTI appears to have resolved.  Will get urinalysis to evaluate for prior noted hematuria-though not to evaluate for clearance of UTI.  If unable to obtain here- we will have obtained at [facility] voice is May the you kdale.  Diabetes mellitus type II, controlled S:  controlled. On glimepiride 4 mg Lab Results  Component Value Date   HGBA1C 7.1 (H) 03/15/2017   HGBA1C 6.4 03/03/2016   HGBA1C 10.3 10/27/2015    A/P: Due to his dementia, we will stop glimepiride 4 mg with hypoglycemia risk.  GFR has remained over 30-we will start metformin 250 mg twice a day.  If has issues with this may trial once a day after a week break.   Essential hypertension S: controlled on amlodipine 2.5mg  (down from 5mg ) .  BP Readings from Last 3 Encounters:  04/10/17 118/66  03/26/17 (!) 109/57  03/15/17 100/64  A/P: We discussed blood pressure goal of <140/90. Continue current meds  Hypothyroid TSH is slightly  off in the hospital.  He was ill at the time-we will repeat with future labs  Return in about 4 months (around 08/11/2017) for follow up- or sooner if needed.  Orders Placed This Encounter  Procedures  . POCT Urinalysis Dipstick (Automated)    Meds ordered this encounter  Medications  . metFORMIN (GLUCOPHAGE) 500 MG tablet    Sig: Take 0.5 tablets (250 mg total) by mouth 2 (two) times daily with a meal.    Dispense:  45 tablet    Refill:  3    Return precautions advised.  [name], MD

## 2017-04-11 ENCOUNTER — Telehealth: Payer: Self-pay | Admitting: Family Medicine

## 2017-04-11 NOTE — Telephone Encounter (Signed)
Pt missed the appointment today at 10:30.

## 2017-04-30 ENCOUNTER — Ambulatory Visit: Payer: Medicare Other | Admitting: Podiatry

## 2017-05-02 ENCOUNTER — Telehealth: Payer: Self-pay | Admitting: Family Medicine

## 2017-05-02 NOTE — Telephone Encounter (Signed)
Robert Salinas with Robert Salinas called in reference to Dr. Yong Channel stating patient needed to check blood sugar at night time. Robert Salinas would like to know if this can be changed to early morning or if there is a specific reason Dr. Yong Channel would like patient's blood sugar checked at night time. Please advise.

## 2017-05-02 NOTE — Telephone Encounter (Signed)
Order faxed over to [facility]

## 2017-05-02 NOTE — Telephone Encounter (Signed)
Morning is fine.

## 2017-05-14 ENCOUNTER — Telehealth: Payer: Self-pay | Admitting: Family Medicine

## 2017-05-14 ENCOUNTER — Other Ambulatory Visit: Payer: Self-pay

## 2017-05-14 MED ORDER — LEVOTHYROXINE SODIUM 75 MCG PO TABS: 75.0000 ug | ORAL_TABLET | Freq: Every day | ORAL | 3 refills | Status: DC

## 2017-05-14 NOTE — Telephone Encounter (Signed)
Prescription sent to pharmacy as requested.

## 2017-05-14 NOTE — Telephone Encounter (Signed)
MEDICATION: levothyroxine (SYNTHROID, LEVOTHROID) 75 MCG tablet  PHARMACY:  [facility], [place] - [address]  IS THIS A 90 DAY SUPPLY : yes  IS PATIENT OUT OF MEDICATION: yes  IF NOT; HOW MUCH IS LEFT: n/a  LAST APPOINTMENT DATE: @10 /23/18  NEXT APPOINTMENT DATE:@Visit  date not found  OTHER COMMENTS:    **Let patient know to contact pharmacy at the end of the day to make sure medication is ready. **  ** Please notify patient to allow 48-72 hours to process**  **Encourage patient to contact the pharmacy for refills or they can request refills through [facility]**

## 2017-05-18 ENCOUNTER — Telehealth: Payer: Self-pay

## 2017-05-18 NOTE — Telephone Encounter (Signed)
Called and no answer. Amber- what evidence is there that I have permission to speak with this person about patient?  [name]

## 2017-05-18 NOTE — Telephone Encounter (Signed)
Copied from [place]. Topic: Inquiry >> May 18, 2017  1:07 PM Conception Chancy, NT wrote: Reason for CRM: Gaylord Shih Mcdonough has questions to ask [name] in regards to this patient about the level of care for the patient. He is needing a call back as soon as dr Retail banker can contact him.   [phone]

## 2017-05-18 NOTE — Telephone Encounter (Signed)
Please see message. °

## 2017-05-21 NOTE — Telephone Encounter (Signed)
Dr. Yong Channel - On a cursory look through her chart, I do not see a DPR or any other signed document giving you or any other healthcare provider permission to talk to this person.  I will do a deeper look, however, and let you know if I find anything.

## 2017-05-21 NOTE — Telephone Encounter (Signed)
Sorry - his chart.

## 2017-05-21 NOTE — Telephone Encounter (Signed)
Thanks amber- just want to make sure I have family permission before discussing case with them- havent had many circumstances like this

## 2017-05-21 NOTE — Telephone Encounter (Signed)
Dr. Yong Channel, I have not been able to find any documentation in this patient's chart giving permission for you to talk to this attorney.

## 2017-06-05 ENCOUNTER — Encounter: Payer: Self-pay | Admitting: Podiatry

## 2017-06-05 ENCOUNTER — Ambulatory Visit (INDEPENDENT_AMBULATORY_CARE_PROVIDER_SITE_OTHER): Payer: Medicare Other | Admitting: Podiatry

## 2017-06-05 DIAGNOSIS — E1159 Type 2 diabetes mellitus with other circulatory complications: Secondary | ICD-10-CM | POA: Diagnosis not present

## 2017-06-05 DIAGNOSIS — B351 Tinea unguium: Secondary | ICD-10-CM | POA: Diagnosis not present

## 2017-06-05 DIAGNOSIS — M79676 Pain in unspecified toe(s): Secondary | ICD-10-CM

## 2017-06-05 NOTE — Progress Notes (Addendum)
Patient ID: Robert Salinas., male   DOB: 09/22/1926, 83 y.o.   MRN: [id number] Complaint:  Visit Type: Patient returns to my office for continued preventative foot care services. Complaint: Patient states" my nails have grown long and thick and become painful to walk and wear shoes" Patient has been diagnosed with DM with vascular problems. The patient presents for preventative foot care services. No changes to ROS.  Patient has not been seen in over 14 months.  Podiatric Exam: Vascular: dorsalis pedis  are palpable bilateral  Posterior tibial pulses are absent  B/L. Capillary return is immediate. Temperature gradient is WNL. Skin turgor WNL  Sensorium: Normal Semmes Weinstein monofilament test. Normal tactile sensation bilaterally. Nail Exam: Pt has thick disfigured discolored nails with subungual debris noted bilateral entire nail hallux through fifth toenails Ulcer Exam: There is no evidence of ulcer or pre-ulcerative changes or infection. Orthopedic Exam: Muscle tone and strength are WNL. No limitations in general ROM. No crepitus or effusions noted. Foot type and digits show no abnormalities. Bony prominences are unremarkable. Skin: No Porokeratosis. No infection or ulcers  Diagnosis:  Onychomycosis, , Pain in right toe, pain in left toes  Treatment & Plan Procedures and Treatment: Consent by patient was obtained for treatment procedures. The patient understood the discussion of treatment and procedures well. All questions were answered thoroughly reviewed. Debridement of mycotic and hypertrophic toenails, 1 through 5 bilateral and clearing of subungual debris. No ulceration, no infection noted.  Return Visit-Office Procedure: Patient instructed to return to the office for a follow up visit 3 months for continued evaluation and treatment.    Gardiner Barefoot DPM

## 2017-09-05 ENCOUNTER — Ambulatory Visit: Payer: Medicare Other | Admitting: Podiatry

## 2018-06-18 ENCOUNTER — Emergency Department (HOSPITAL_COMMUNITY)
Admission: EM | Admit: 2018-06-18 | Discharge: 2018-06-19 | Disposition: A | Discharge status: Home / Self Care | Payer: Medicare Other | Attending: Emergency Medicine | Admitting: Emergency Medicine

## 2018-06-18 ENCOUNTER — Other Ambulatory Visit: Payer: Self-pay

## 2018-06-18 ENCOUNTER — Encounter (HOSPITAL_COMMUNITY): Payer: Self-pay

## 2018-06-18 ENCOUNTER — Emergency Department (HOSPITAL_COMMUNITY): Payer: Medicare Other

## 2018-06-18 DIAGNOSIS — G300 Alzheimer's disease with early onset: Secondary | ICD-10-CM | POA: Diagnosis not present

## 2018-06-18 DIAGNOSIS — S0990XA Unspecified injury of head, initial encounter: Secondary | ICD-10-CM

## 2018-06-18 DIAGNOSIS — W19XXXA Unspecified fall, initial encounter: Secondary | ICD-10-CM

## 2018-06-18 DIAGNOSIS — Y999 Unspecified external cause status: Secondary | ICD-10-CM | POA: Insufficient documentation

## 2018-06-18 DIAGNOSIS — E1122 Type 2 diabetes mellitus with diabetic chronic kidney disease: Secondary | ICD-10-CM | POA: Insufficient documentation

## 2018-06-18 DIAGNOSIS — S0083XA Contusion of other part of head, initial encounter: Secondary | ICD-10-CM | POA: Insufficient documentation

## 2018-06-18 DIAGNOSIS — I129 Hypertensive chronic kidney disease with stage 1 through stage 4 chronic kidney disease, or unspecified chronic kidney disease: Secondary | ICD-10-CM | POA: Insufficient documentation

## 2018-06-18 DIAGNOSIS — Z7984 Long term (current) use of oral hypoglycemic drugs: Secondary | ICD-10-CM | POA: Insufficient documentation

## 2018-06-18 DIAGNOSIS — N183 Chronic kidney disease, stage 3 (moderate): Secondary | ICD-10-CM | POA: Diagnosis not present

## 2018-06-18 DIAGNOSIS — Y939 Activity, unspecified: Secondary | ICD-10-CM | POA: Diagnosis not present

## 2018-06-18 DIAGNOSIS — Y92129 Unspecified place in nursing home as the place of occurrence of the external cause: Secondary | ICD-10-CM | POA: Diagnosis not present

## 2018-06-18 DIAGNOSIS — S41111A Laceration without foreign body of right upper arm, initial encounter: Secondary | ICD-10-CM | POA: Diagnosis not present

## 2018-06-18 DIAGNOSIS — F0280 Dementia in other diseases classified elsewhere without behavioral disturbance: Secondary | ICD-10-CM | POA: Diagnosis not present

## 2018-06-18 DIAGNOSIS — N3001 Acute cystitis with hematuria: Secondary | ICD-10-CM

## 2018-06-18 HISTORY — DX: Alzheimer's disease, unspecified: G30.9

## 2018-06-18 HISTORY — DX: Dementia in other diseases classified elsewhere, unspecified severity, without behavioral disturbance, psychotic disturbance, mood disturbance, and anxiety: F02.80

## 2018-06-18 HISTORY — DX: Chronic kidney disease, unspecified: N18.9

## 2018-06-18 LAB — COMPREHENSIVE METABOLIC PANEL
ALT: 17 U/L (ref 0–44)
AST: 21 U/L (ref 15–41)
Albumin: 4.4 g/dL (ref 3.5–5.0)
Alkaline Phosphatase: 67 U/L (ref 38–126)
Anion gap: 9 (ref 5–15)
BUN: 38 mg/dL — ABNORMAL HIGH (ref 8–23)
CO2: 24 mmol/L (ref 22–32)
Calcium: 9.2 mg/dL (ref 8.9–10.3)
Chloride: 104 mmol/L (ref 98–111)
Creatinine, Ser: 1.75 mg/dL — ABNORMAL HIGH (ref 0.61–1.24)
GFR calc Af Amer: 39 mL/min — ABNORMAL LOW (ref >60)
GFR calc non Af Amer: 34 mL/min — ABNORMAL LOW (ref >60)
Glucose, Bld: 148 mg/dL — ABNORMAL HIGH (ref 70–99)
Potassium: 4.9 mmol/L (ref 3.5–5.1)
Sodium: 137 mmol/L (ref 135–145)
Total Bilirubin: 0.9 mg/dL (ref 0.3–1.2)
Total Protein: 7.1 g/dL (ref 6.5–8.1)

## 2018-06-18 LAB — CBC WITH DIFFERENTIAL/PLATELET
Abs Immature Granulocytes: 0.01 10*3/uL (ref 0.00–0.07)
Basophils Absolute: 0 10*3/uL (ref 0.0–0.1)
Basophils Relative: 1 %
Eosinophils Absolute: 0.1 10*3/uL (ref 0.0–0.5)
Eosinophils Relative: 1 %
HCT: 45.2 % (ref 39.0–52.0)
Hemoglobin: 14.1 g/dL (ref 13.0–17.0)
Immature Granulocytes: 0 %
Lymphocytes Relative: 17 %
Lymphs Abs: 1.3 10*3/uL (ref 0.7–4.0)
MCH: 30.6 pg (ref 26.0–34.0)
MCHC: 31.2 g/dL (ref 30.0–36.0)
MCV: 98 fL (ref 80.0–100.0)
MONO ABS: 0.6 10*3/uL (ref 0.1–1.0)
Monocytes Relative: 7 %
NEUTROS ABS: 5.7 10*3/uL (ref 1.7–7.7)
Neutrophils Relative %: 74 %
Platelets: 135 10*3/uL — ABNORMAL LOW (ref 150–400)
RBC: 4.61 MIL/uL (ref 4.22–5.81)
RDW: 13.7 % (ref 11.5–15.5)
WBC: 7.6 10*3/uL (ref 4.0–10.5)
nRBC: 0 % (ref 0.0–0.2)

## 2018-06-18 MED ORDER — BACITRACIN ZINC 500 UNIT/GM EX OINT
TOPICAL_OINTMENT | Freq: Two times a day (BID) | CUTANEOUS | Status: DC
  Administered 2018-06-18: 22:00:00 via TOPICAL
  Filled 2018-06-18: qty 1.8

## 2018-06-18 NOTE — ED Provider Notes (Signed)
[facility] DEPT Provider Note   CSN: [id number] Arrival date & time: 06/18/18  2034     History   Chief Complaint Chief Complaint  Patient presents with  . Fall    HPI Robert Salinas. is a 84 y.o. male presenting for evaluation after fall.  Level 5 caveat, patient with dementia.  History provided by son.  Per son, patient is a resident at [employer].  He had an unwitnessed fall today, no known loss of consciousness.  This happened just prior to arrival.  Per son, patient has been acting normal.  Per son, patient is not fall frequently.  Son is unsure if patient is on blood thinners.  Patient at some point states he hurts all over, and at other times states he has no pain.  Additional history obtained from chart review.  Patient with a history of paroxysmal A. fib, BPH, CKD, dementia, diabetes, hypertension, hypothyroidism, hyperlipidemia.  Per chart review, he is not on blood thinners, takes aspirin.  HPI  Past Medical History:  Diagnosis Date  . Alzheimer disease ([place])   . Atrial fibrillation ([place])   . BPH (benign prostatic hyperplasia)   . Chickenpox   . Chronic kidney disease   . COLONIC POLYPS, HX OF 06/09/2009  . Dementia ([place])   . Diabetes mellitus   . Hx of colonic polyps   . Hyperlipidemia   . Hypertension   . Hypothyroidism   . Hypothyroidism   . Thrombocytopenia [facility])     Patient Active Problem List   Diagnosis Date Noted  . UTI (urinary tract infection) 03/20/2017  . DNR (do not resuscitate) 12/08/2013  . Hypothyroid 10/09/2011  . CKD (chronic kidney disease), stage III ([place]) 02/21/2011  . Diabetes mellitus type II, controlled ([place]) 06/09/2009  . Hyperlipidemia 06/09/2009  . ALZHEIMER'S DISEASE, EARLY 06/09/2009  . Essential hypertension 06/09/2009  . Atrial fibrillation ([place]) 06/09/2009    Past Surgical History:  Procedure Laterality Date  . APPENDECTOMY    . PROSTATECTOMY     BPH cause?        Home  Medications    Prior to Admission medications   Medication Sig Start Date End Date Taking? Authorizing Provider  amLODipine (NORVASC) 2.5 MG tablet Take 1 tablet (2.5 mg total) by mouth daily. 03/15/17   [name], MD  aspirin 81 MG tablet Take 1 tablet (81 mg total) by mouth daily. 03/03/16   [name], MD  atorvastatin (LIPITOR) 10 MG tablet TAKE 1 TABLET DAILY 08/13/15   [name], MD  donepezil (ARICEPT) 10 MG tablet TAKE 1 TABLET AT BEDTIME 03/13/16   [name], MD  glucose blood (ONETOUCH VERIO) test strip Use to test blood sugars daily. Dx: E11.9 10/27/15   [name], MD  Lancets [facility] ULTRASOFT) lancets Use to test blood sugars daily. Dx: E11.9 10/27/15   [name], MD  levothyroxine (SYNTHROID, LEVOTHROID) 75 MCG tablet Take 1 tablet (75 mcg total) by mouth daily before breakfast. 05/14/17   [name], MD  metFORMIN (GLUCOPHAGE) 500 MG tablet Take 0.5 tablets (250 mg total) by mouth 2 (two) times daily with a meal. 04/10/17   [name], MD    Family History No family history on file.  Social History Social History   Tobacco Use  . Smoking status: Never Smoker  . Smokeless tobacco: Never Used  Substance Use Topics  . Alcohol use: No  . Drug use: No  Allergies   Patient has no known allergies.   Review of Systems Review of Systems  Unable to perform ROS: Dementia     Physical Exam Updated Vital Signs BP 140/63 (BP Location: Left Arm)   Pulse 68   Temp 97.7 F (36.5 C) (Axillary)   Resp 16   SpO2 100%   Physical Exam Vitals signs and nursing note reviewed.  Constitutional:      General: He is not in acute distress.    Appearance: He is well-developed.     Comments: Elderly male who appears nontoxic.  HENT:     Head: Normocephalic.     Comments: Hematoma to occiput.  No laceration.  No injury noted elsewhere in the head.    Right Ear: Tympanic membrane, ear canal and external ear normal.      Left Ear: Tympanic membrane, ear canal and external ear normal.     Nose: Nose normal.     Mouth/Throat:     Mouth: Mucous membranes are moist.     Pharynx: Oropharynx is clear.  Eyes:     Extraocular Movements: Extraocular movements intact.     Conjunctiva/sclera: Conjunctivae normal.     Pupils: Pupils are equal, round, and reactive to light.     Comments: EOMI and PERRLA.  Neck:     Musculoskeletal: Normal range of motion and neck supple.     Comments: Moving head easily during exam.  No signs of tenderness of the C-spine. Cardiovascular:     Rate and Rhythm: Normal rate and regular rhythm.  Pulmonary:     Effort: Pulmonary effort is normal. No respiratory distress.     Breath sounds: Normal breath sounds. No wheezing.  Abdominal:     General: There is no distension.     Palpations: Abdomen is soft.     Tenderness: There is no abdominal tenderness.  Musculoskeletal: Normal range of motion.     Comments: No obvious deformity of upper or lower extremities.  Strength intact bilaterally.  Sensation intact x4.   Skin:    General: Skin is warm and dry.     Capillary Refill: Capillary refill takes less than 2 seconds.     Comments: Skin tear on right elbow and right upper arm.  Bruising noted on the left elbow without skin tear.  No skin tears noted elsewhere.  Neurological:     General: No focal deficit present.     Mental Status: He is alert and oriented to person, place, and time.      ED Treatments / Results  Labs (all labs ordered are listed, but only abnormal results are displayed) Labs Reviewed  URINALYSIS, ROUTINE W REFLEX MICROSCOPIC  CBC WITH DIFFERENTIAL/PLATELET  COMPREHENSIVE METABOLIC PANEL    EKG None  Radiology Dg Chest 2 View  Result Date: 06/18/2018 CLINICAL DATA:  Unwitnessed fall EXAM: CHEST - 2 VIEW COMPARISON:  03/20/2017 FINDINGS: The heart size and mediastinal contours are within normal limits. Both lungs are clear. Mild degenerative changes of  the spine. IMPRESSION: No active cardiopulmonary disease. Electronically Signed   By: Donavan Foil M.D.   On: 06/18/2018 21:32   Ct Head Wo Contrast  Result Date: 06/18/2018 CLINICAL DATA:  Status post unwitnessed fall, with scalp hematoma at the vertex and right lateral aspect of the head. Concern for cervical spine injury. Initial encounter. EXAM: CT HEAD WITHOUT CONTRAST CT CERVICAL SPINE WITHOUT CONTRAST TECHNIQUE: Multidetector CT imaging of the head and cervical spine was performed following the standard protocol without  intravenous contrast. Multiplanar CT image reconstructions of the cervical spine were also generated. COMPARISON:  CT of the head and cervical spine performed 03/20/2017 FINDINGS: CT HEAD FINDINGS Brain: No evidence of acute infarction, hemorrhage, hydrocephalus, extra-axial collection or mass lesion / mass effect. Prominence of the ventricles and sulci reflects moderate cortical volume loss. Cerebellar atrophy is noted. Diffuse periventricular and subcortical white matter change likely reflects small vessel ischemic microangiopathy. Chronic ischemic change is noted at the basal ganglia bilaterally. The brainstem and fourth ventricle are within normal limits. The basal ganglia are unremarkable in appearance. The cerebral hemispheres demonstrate grossly normal gray-white differentiation. No mass effect or midline shift is seen. Vascular: No hyperdense vessel or unexpected calcification. Skull: There is no evidence of fracture; visualized osseous structures are unremarkable in appearance. Sinuses/Orbits: The visualized portions of the orbits are within normal limits. The paranasal sinuses and mastoid air cells are well-aerated. Other: Soft tissue swelling is noted at the posterior vertex and overlying the right parietal calvarium. CT CERVICAL SPINE FINDINGS Alignment: Normal. Skull base and vertebrae: No acute fracture. No primary bone lesion or focal pathologic process. Soft tissues and  spinal canal: No prevertebral fluid or swelling. No visible canal hematoma. Disc levels: Intervertebral disc space narrowing is noted at C6-C7. Scattered anterior and posterior disc osteophyte complexes are seen along the cervical spine. Underlying facet disease is noted. Upper chest: Calcification is noted at the carotid bifurcations bilaterally. The thyroid gland is grossly unremarkable in appearance. The visualized lung apices are clear. Other: No additional soft tissue abnormalities are seen. IMPRESSION: 1. No evidence of traumatic intracranial injury or fracture. 2. No evidence of fracture or subluxation along the cervical spine. 3. Soft tissue swelling at the posterior vertex and overlying the right parietal calvarium. 4. Moderate cortical volume loss and diffuse small vessel ischemic microangiopathy. 5. Chronic ischemic change at the basal ganglia bilaterally. 6. Mild degenerative change along the cervical spine. 7. Calcification at the carotid bifurcations bilaterally. Carotid ultrasound would be helpful for further evaluation, when and as deemed clinically appropriate. Electronically Signed   By: Garald Balding M.D.   On: 06/18/2018 21:53   Ct Cervical Spine Wo Contrast  Result Date: 06/18/2018 CLINICAL DATA:  Status post unwitnessed fall, with scalp hematoma at the vertex and right lateral aspect of the head. Concern for cervical spine injury. Initial encounter. EXAM: CT HEAD WITHOUT CONTRAST CT CERVICAL SPINE WITHOUT CONTRAST TECHNIQUE: Multidetector CT imaging of the head and cervical spine was performed following the standard protocol without intravenous contrast. Multiplanar CT image reconstructions of the cervical spine were also generated. COMPARISON:  CT of the head and cervical spine performed 03/20/2017 FINDINGS: CT HEAD FINDINGS Brain: No evidence of acute infarction, hemorrhage, hydrocephalus, extra-axial collection or mass lesion / mass effect. Prominence of the ventricles and sulci  reflects moderate cortical volume loss. Cerebellar atrophy is noted. Diffuse periventricular and subcortical white matter change likely reflects small vessel ischemic microangiopathy. Chronic ischemic change is noted at the basal ganglia bilaterally. The brainstem and fourth ventricle are within normal limits. The basal ganglia are unremarkable in appearance. The cerebral hemispheres demonstrate grossly normal gray-white differentiation. No mass effect or midline shift is seen. Vascular: No hyperdense vessel or unexpected calcification. Skull: There is no evidence of fracture; visualized osseous structures are unremarkable in appearance. Sinuses/Orbits: The visualized portions of the orbits are within normal limits. The paranasal sinuses and mastoid air cells are well-aerated. Other: Soft tissue swelling is noted at the posterior vertex and overlying the right  parietal calvarium. CT CERVICAL SPINE FINDINGS Alignment: Normal. Skull base and vertebrae: No acute fracture. No primary bone lesion or focal pathologic process. Soft tissues and spinal canal: No prevertebral fluid or swelling. No visible canal hematoma. Disc levels: Intervertebral disc space narrowing is noted at C6-C7. Scattered anterior and posterior disc osteophyte complexes are seen along the cervical spine. Underlying facet disease is noted. Upper chest: Calcification is noted at the carotid bifurcations bilaterally. The thyroid gland is grossly unremarkable in appearance. The visualized lung apices are clear. Other: No additional soft tissue abnormalities are seen. IMPRESSION: 1. No evidence of traumatic intracranial injury or fracture. 2. No evidence of fracture or subluxation along the cervical spine. 3. Soft tissue swelling at the posterior vertex and overlying the right parietal calvarium. 4. Moderate cortical volume loss and diffuse small vessel ischemic microangiopathy. 5. Chronic ischemic change at the basal ganglia bilaterally. 6. Mild  degenerative change along the cervical spine. 7. Calcification at the carotid bifurcations bilaterally. Carotid ultrasound would be helpful for further evaluation, when and as deemed clinically appropriate. Electronically Signed   By: Garald Balding M.D.   On: 06/18/2018 21:53    Procedures Procedures (including critical care time)  Medications Ordered in ED Medications  bacitracin ointment ( Topical Given 06/18/18 2144)     Initial Impression / Assessment and Plan / ED Course  I have reviewed the triage vital signs and the nursing notes.  Pertinent labs & imaging results that were available during my care of the patient were reviewed by me and considered in my medical decision making (see chart for details).     Pt presenting for evaluation after an unwitnessed fall.  On exam, patient without focal neurologic deficits.  Son states patient is acting baseline.  Hematoma to the occiput and skin tears to the right upper extremity.  No other injury noted.  As follows unwitnessed, will obtain CT head and neck and chest x-ray.  Will obtain ekg, basic labs and urine.  EKG without STEMI or concerning findings.  Chest x-ray viewed interpreted by me, no fracture or dislocation.  No pneumothorax.  CT head and neck negative for acute findings.  Does show bilateral carotid calcifications, discussed with patient and son importance of follow-up with PCP for ultrasound for further evaluation of the carotids.  Discussed with attending, Dr. Tyrone Nine evaluated the patient.  Pt signed out to Robert Key, PA-C for f/u on labs and UA. Plan for d/c if normal.   Final Clinical Impressions(s) / ED Diagnoses   Final diagnoses:  None    ED Discharge Orders    None       Franchot Heidelberg, PA-C 06/18/18 [place], [place], DO 06/18/18 2330

## 2018-06-18 NOTE — Discharge Instructions (Addendum)
Continue taking home medications as prescribed. You have a urinary tract infection, take keflex for this.  Your kidney function today showed that you are a little dehydrated.  Make sure you are drinking enough water. Use Tylenol as needed for pain. Wash the skin tears once a day with soap and water, reapplying a new dressing each day. Follow-up with your primary care doctor for further evaluation of your carotids.  These will likely need an ultrasound. Return to the emergency room with any new, worsening, concerning symptoms.

## 2018-06-18 NOTE — ED Notes (Signed)
Bed: [facility] Expected date:  Expected time:  Means of arrival:  Comments: 55M fall from bed

## 2018-06-18 NOTE — ED Notes (Signed)
EKG given to EDP,Floyd,MD., for review. 

## 2018-06-18 NOTE — ED Notes (Signed)
Patient transported to CT 

## 2018-06-18 NOTE — ED Triage Notes (Signed)
Pt arrived via [facility] due to an unwitnessed fall today. Pt has skin tears to the right arm and elbow. No complaints of pain at this time.

## 2018-06-19 LAB — URINALYSIS, ROUTINE W REFLEX MICROSCOPIC
Bilirubin Urine: NEGATIVE
Glucose, UA: NEGATIVE mg/dL
Ketones, ur: NEGATIVE mg/dL
Nitrite: NEGATIVE
Protein, ur: 30 mg/dL — AB
Specific Gravity, Urine: 1.013 (ref 1.005–1.030)
WBC, UA: >50 WBC/hpf — ABNORMAL HIGH (ref 0–5)
pH: 5 (ref 5.0–8.0)

## 2018-06-19 MED ORDER — CEPHALEXIN 500 MG PO CAPS: 500.0000 mg | ORAL_CAPSULE | Freq: Two times a day (BID) | ORAL | 0 refills | Status: AC

## 2018-06-19 MED ORDER — CEPHALEXIN 500 MG PO CAPS: 500.0000 mg | ORAL_CAPSULE | Freq: Two times a day (BID) | ORAL | 0 refills | Status: DC

## 2018-06-19 NOTE — ED Notes (Signed)
[facility] called for transport.  

## 2018-06-19 NOTE — ED Provider Notes (Signed)
Pt handed off to me by previous EDPA at shift change pending UA and discharge. Please see previous note for full details  Physical Exam  BP 140/63 (BP Location: Left Arm)   Pulse 68   Temp 97.7 F (36.5 C) (Axillary)   Resp 16   SpO2 100%   Physical Exam Constitutional:      Appearance: He is well-developed. He is not toxic-appearing.     Comments: Alert, in no distress. Pleasant. Family at bedside.  HENT:     Head: Normocephalic.     Right Ear: External ear normal.     Left Ear: External ear normal.     Nose: Nose normal.  Eyes:     Conjunctiva/sclera: Conjunctivae normal.  Neck:     Musculoskeletal: Full passive range of motion without pain.  Cardiovascular:     Rate and Rhythm: Normal rate.  Pulmonary:     Effort: Pulmonary effort is normal. No tachypnea or respiratory distress.  Musculoskeletal: Normal range of motion.  Skin:    General: Skin is warm and dry.     Capillary Refill: Capillary refill takes less than 2 seconds.  Neurological:     Mental Status: He is alert and oriented to person, place, and time.  Psychiatric:        Behavior: Behavior normal.        Thought Content: Thought content normal.     ED Course/Procedures   Clinical Course as of Jun 19 28  Wed Jun 19, 2018  0024 Bacteria, UA(!): MANY [CG]  0024 WBC, UA(!): >50 [CG]  0024 Leukocytes, UA(!): LARGE [CG]  0024 Hgb urine dipstick(!): SMALL [CG]    Clinical Course User Index [CG] Kinnie Feil, PA-C    Procedures  MDM    0030: UA consistent with infection.  Discussed with pt and family at bedside. Dc with keflex. Urine culture sent.  Pt and family comfortable with discharge.      Kinnie Feil, PA-C 06/19/18 Sharl Ma    Veryl Speak, MD 06/19/18 [phone]

## 2018-06-27 LAB — SUSCEPTIBILITY, AER + ANAEROB

## 2018-06-27 LAB — SUSCEPTIBILITY RESULT

## 2018-07-01 LAB — URINE CULTURE

## 2019-02-26 IMAGING — CT CT HEAD W/O CM
4 series · 16 of 47 positions shown, 18 images · non-contrast
Comparison: CT head 05/15/2015

CLINICAL DATA: Altered level of consciousness.

EXAM:
CT HEAD WITHOUT CONTRAST
TECHNIQUE: Contiguous axial images were obtained from the base of the skull
through the vertex without intravenous contrast.

[Series 3: head wo · axial · 0.49mm/px · z∈[-140,-15]mm · 7 of 35 slices shown, 9 images]
[im 5/35  brain]
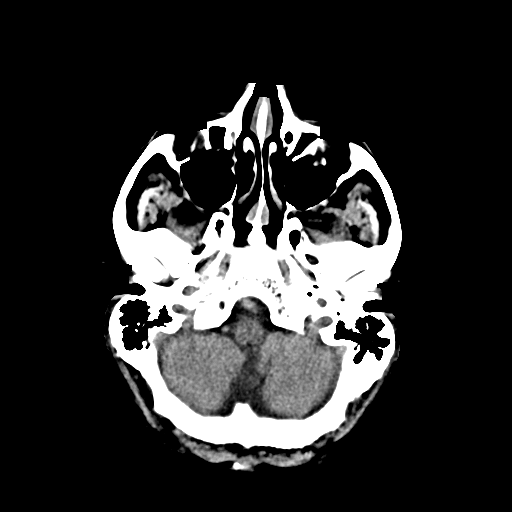
[im 5/35  bone]
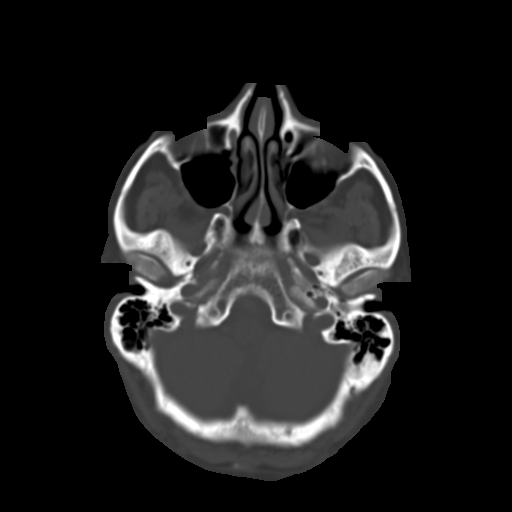
[im 9/35  brain]
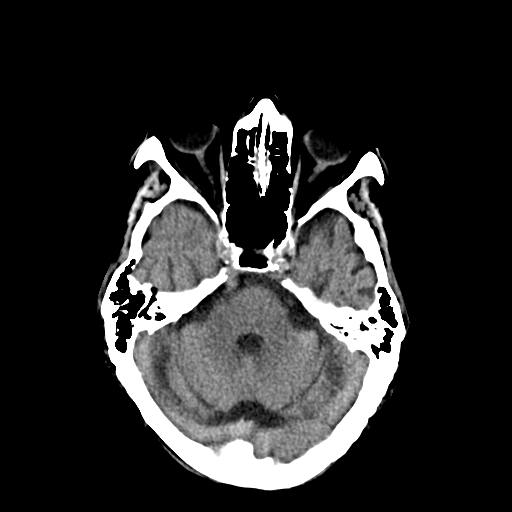
[im 13/35  brain]
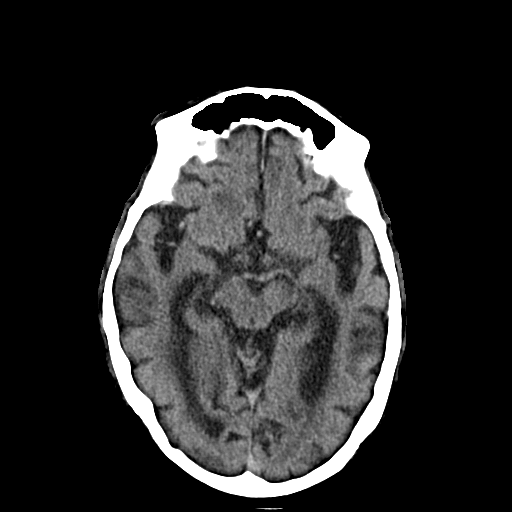
[im 18/35  brain]
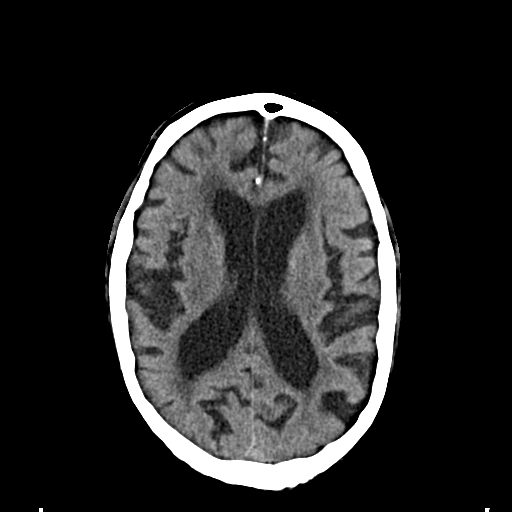
[im 22/35  brain]
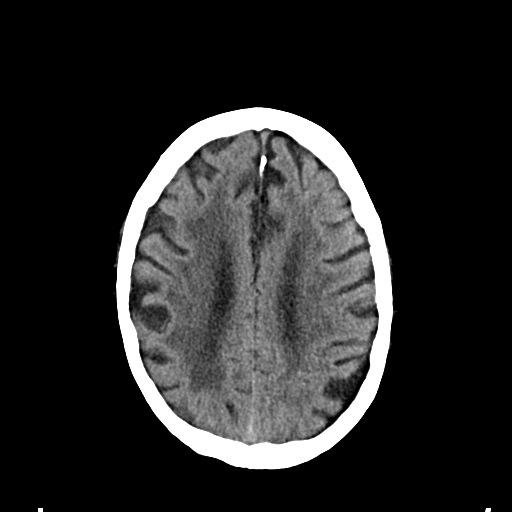
[im 22/35  bone]
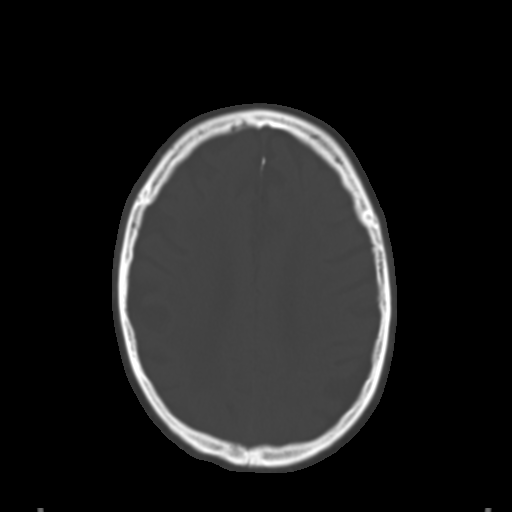
[im 26/35  brain]
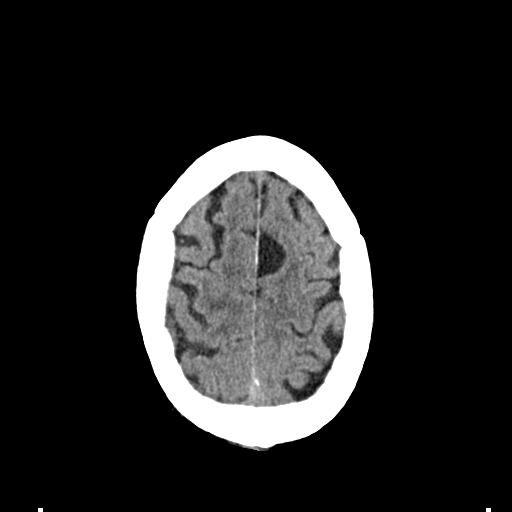
[im 30/35  brain]
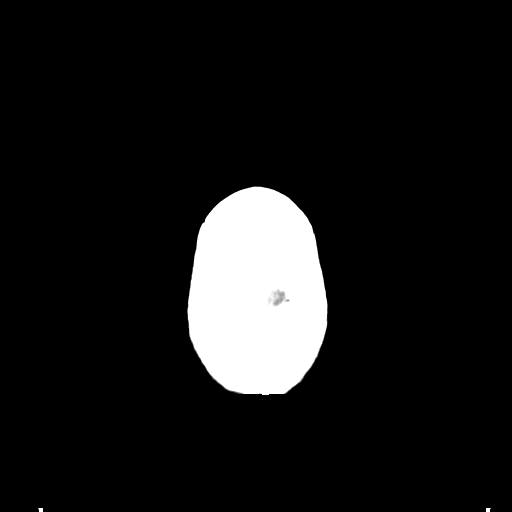

[Series 4: head bone · axial · 0.49mm/px · z∈[-144,-110]mm · 3 of 85 slices shown]
[im 9/85  bone]
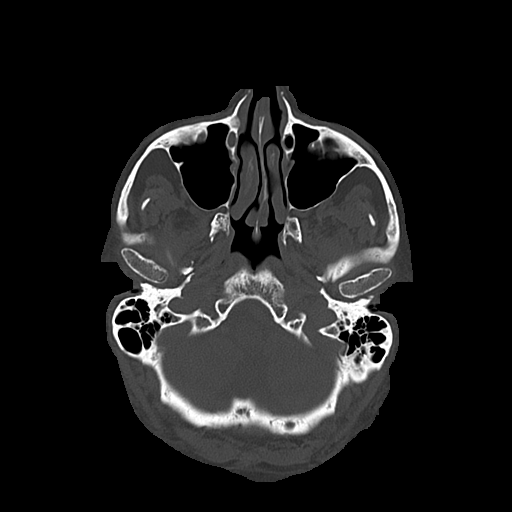
[im 17/85  bone]
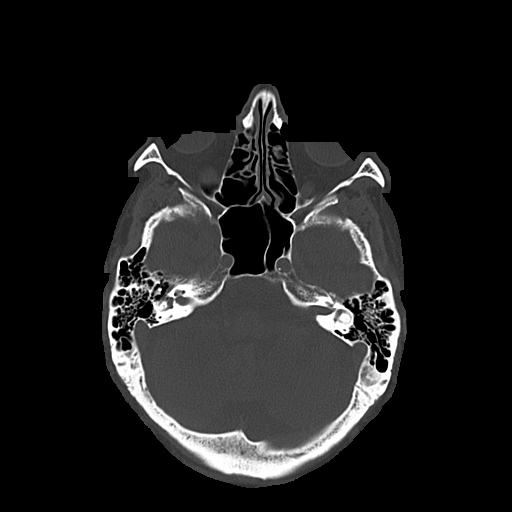
[im 26/85  bone]
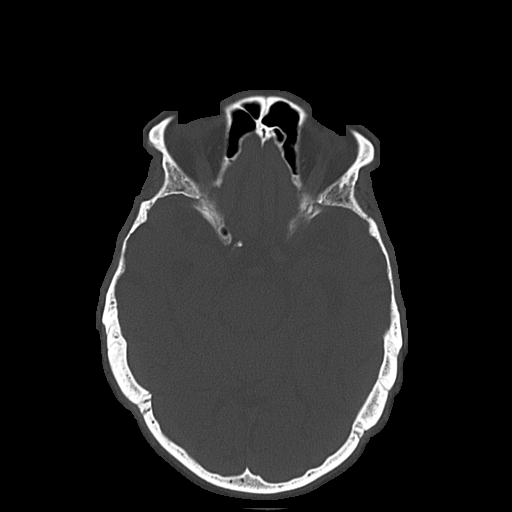

[Series 5: cor soft · coronal · 0.38mm/px · 3 of 81 slices shown]
[im 27/81  brain]
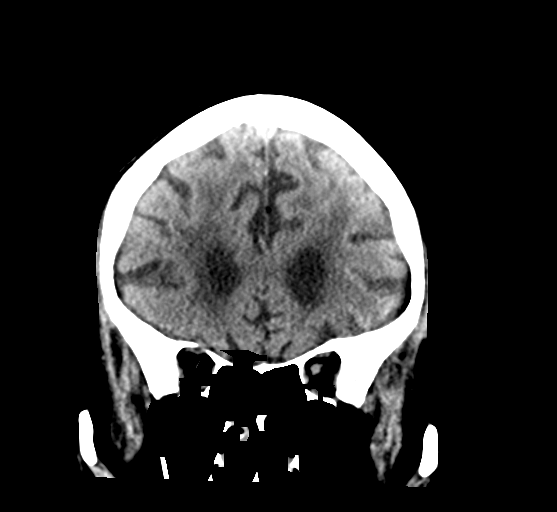
[im 36/81  brain]
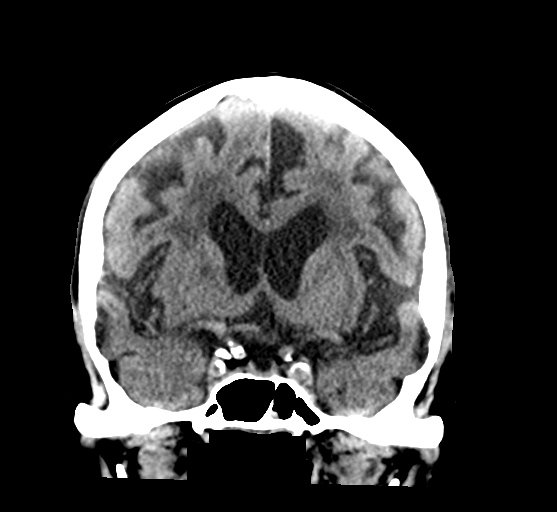
[im 45/81  brain]
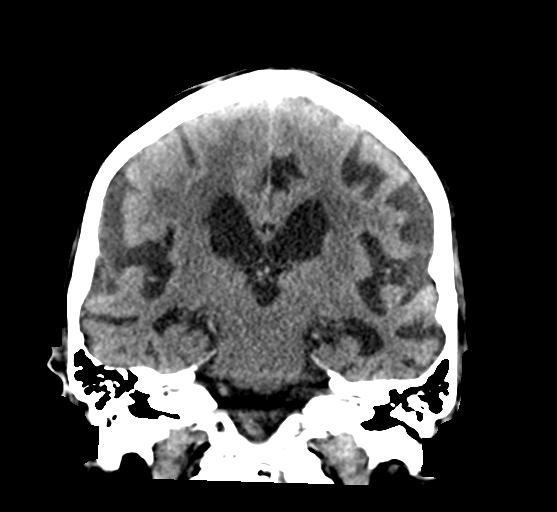

[Series 6: sag soft · sagittal · 0.37mm/px · 3 of 67 slices shown]
[im 23/67  brain]
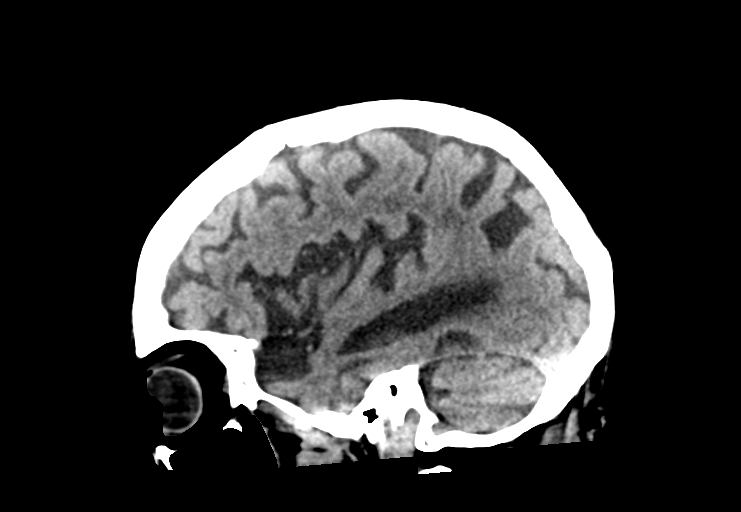
[im 34/67  brain]
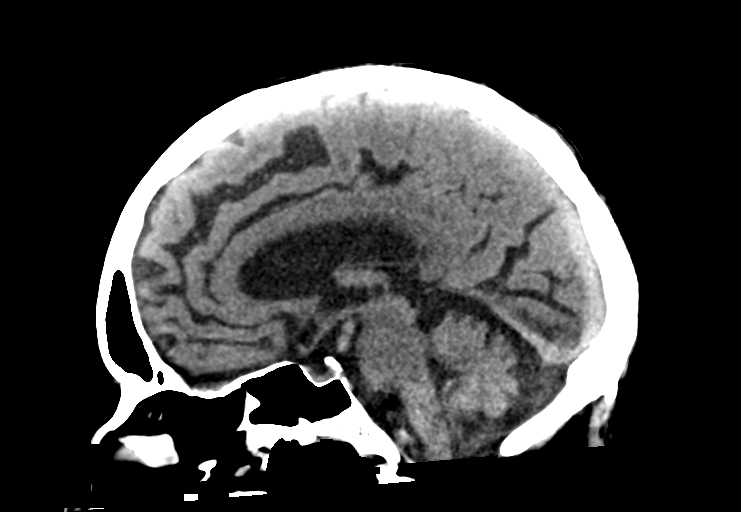
[im 45/67  brain]
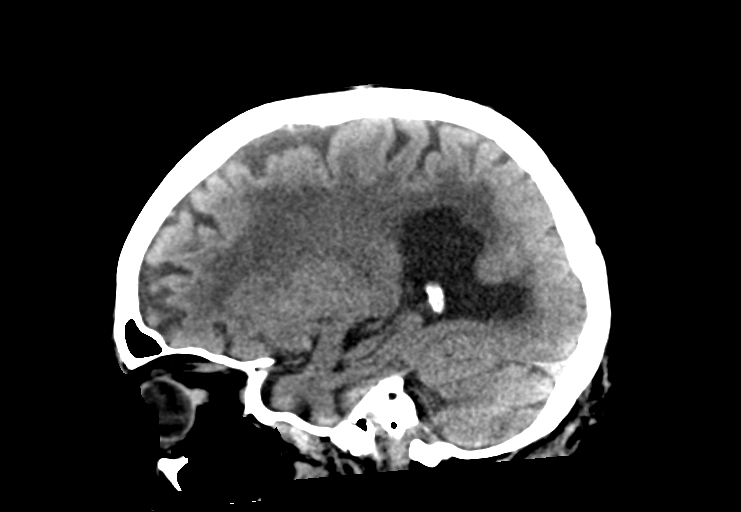

[16 of 47 positions shown; findings below may reference images not displayed]

FINDINGS: Brain: Moderate to advanced atrophy similar to the prior study.
Extensive chronic microvascular ischemia throughout the white matter
unchanged. Negative for acute infarct, hemorrhage, or mass.

Vascular: Negative for hyperdense vessel

Skull: Negative

Sinuses/Orbits: Bilateral cataract removal.  Paranasal sinuses clear

Other: None
IMPRESSION: Extensive atrophy and chronic microvascular ischemia. No acute
abnormality.

## 2019-02-26 IMAGING — DX DG CHEST 2V
2 series · 2 of 2 positions shown · non-contrast
Comparison: 12/25/2008

CLINICAL DATA: Dizziness and confusion beginning today. Atrial
fibrillation.

EXAM:
CHEST  2 VIEW

[chest lat]
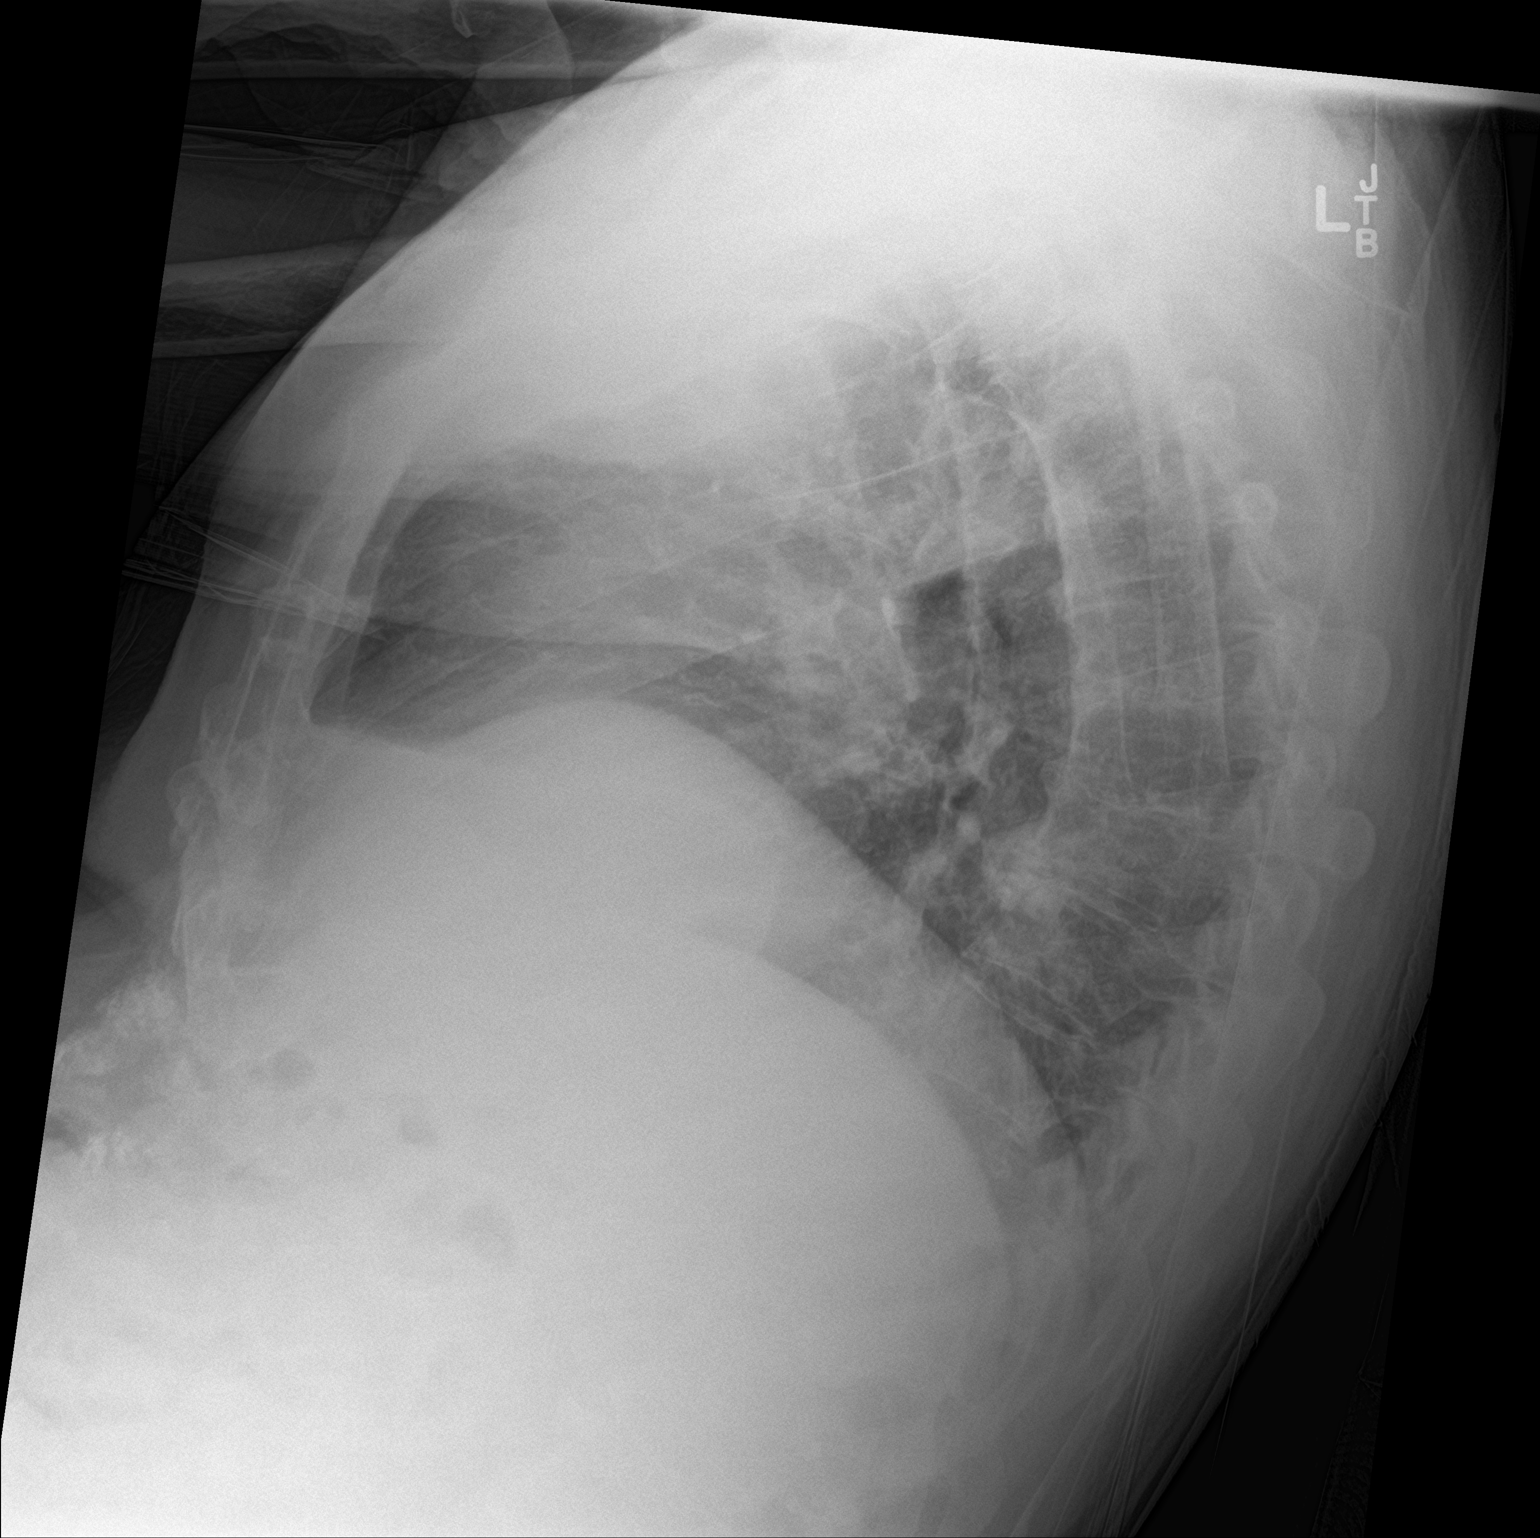

[chest ap]
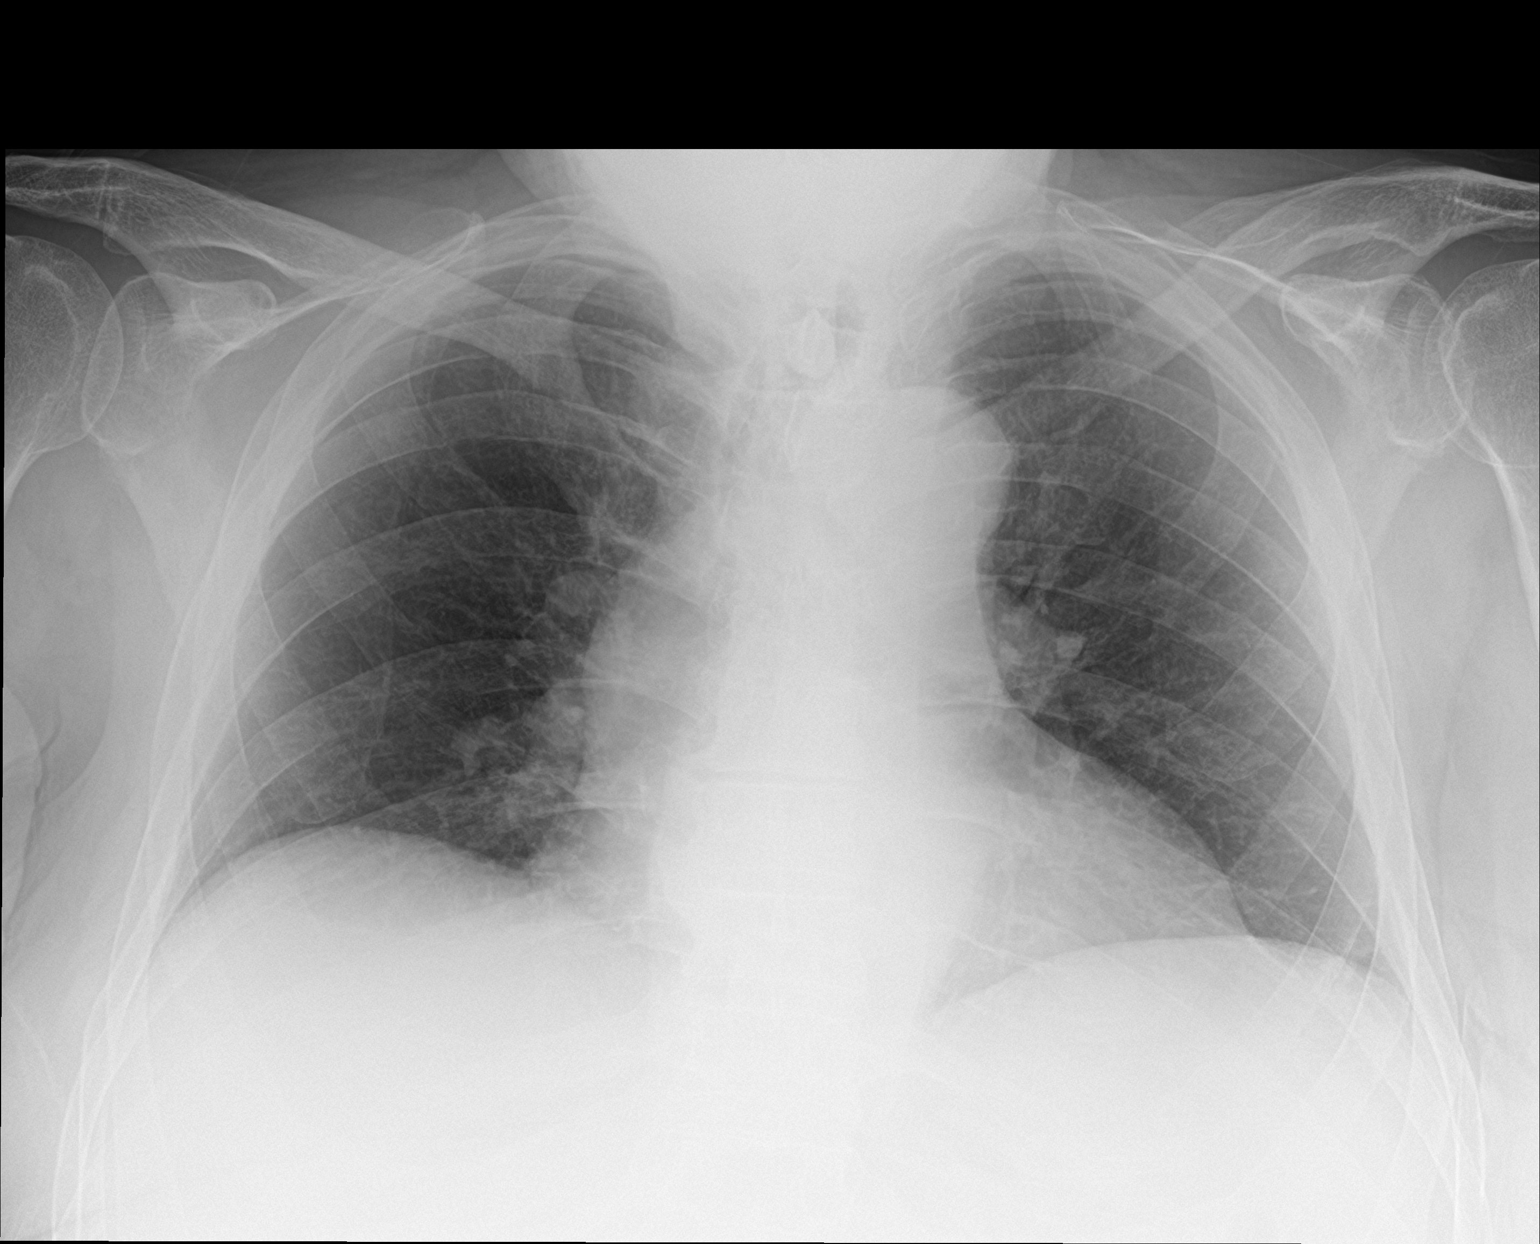

[2 of 2 positions shown; findings below may reference images not displayed]

FINDINGS: Low lung volumes again noted. Heart size remains stable. Both lungs
are clear.
IMPRESSION: Low lung volumes.  No active disease.

## 2019-06-25 ENCOUNTER — Emergency Department (HOSPITAL_COMMUNITY)
Admission: EM | Admit: 2019-06-25 | Discharge: 2019-06-26 | Disposition: A | Discharge status: Home / Self Care | Payer: Medicare Other | Attending: Emergency Medicine | Admitting: Emergency Medicine

## 2019-06-25 ENCOUNTER — Emergency Department (HOSPITAL_COMMUNITY): Payer: Medicare Other

## 2019-06-25 ENCOUNTER — Other Ambulatory Visit: Payer: Self-pay

## 2019-06-25 DIAGNOSIS — N183 Chronic kidney disease, stage 3 unspecified: Secondary | ICD-10-CM | POA: Diagnosis not present

## 2019-06-25 DIAGNOSIS — E1122 Type 2 diabetes mellitus with diabetic chronic kidney disease: Secondary | ICD-10-CM | POA: Insufficient documentation

## 2019-06-25 DIAGNOSIS — Z20822 Contact with and (suspected) exposure to covid-19: Secondary | ICD-10-CM | POA: Diagnosis not present

## 2019-06-25 DIAGNOSIS — R400 Somnolence: Secondary | ICD-10-CM | POA: Diagnosis not present

## 2019-06-25 DIAGNOSIS — N3001 Acute cystitis with hematuria: Secondary | ICD-10-CM | POA: Insufficient documentation

## 2019-06-25 DIAGNOSIS — R4182 Altered mental status, unspecified: Secondary | ICD-10-CM | POA: Diagnosis present

## 2019-06-25 DIAGNOSIS — I129 Hypertensive chronic kidney disease with stage 1 through stage 4 chronic kidney disease, or unspecified chronic kidney disease: Secondary | ICD-10-CM | POA: Insufficient documentation

## 2019-06-25 DIAGNOSIS — G309 Alzheimer's disease, unspecified: Secondary | ICD-10-CM | POA: Insufficient documentation

## 2019-06-25 DIAGNOSIS — E039 Hypothyroidism, unspecified: Secondary | ICD-10-CM | POA: Diagnosis not present

## 2019-06-25 DIAGNOSIS — F0280 Dementia in other diseases classified elsewhere without behavioral disturbance: Secondary | ICD-10-CM | POA: Diagnosis not present

## 2019-06-25 LAB — URINALYSIS, ROUTINE W REFLEX MICROSCOPIC
Bilirubin Urine: NEGATIVE
Glucose, UA: NEGATIVE mg/dL
Ketones, ur: NEGATIVE mg/dL
Nitrite: NEGATIVE
Protein, ur: 100 mg/dL — AB
Specific Gravity, Urine: 1.016 (ref 1.005–1.030)
WBC, UA: >50 WBC/hpf — ABNORMAL HIGH (ref 0–5)
pH: 5 (ref 5.0–8.0)

## 2019-06-25 LAB — BASIC METABOLIC PANEL
Anion gap: 11 (ref 5–15)
BUN: 38 mg/dL — ABNORMAL HIGH (ref 8–23)
CO2: 24 mmol/L (ref 22–32)
Calcium: 9.5 mg/dL (ref 8.9–10.3)
Chloride: 108 mmol/L (ref 98–111)
Creatinine, Ser: 1.85 mg/dL — ABNORMAL HIGH (ref 0.61–1.24)
GFR calc Af Amer: 36 mL/min — ABNORMAL LOW (ref >60)
GFR calc non Af Amer: 31 mL/min — ABNORMAL LOW (ref >60)
Glucose, Bld: 121 mg/dL — ABNORMAL HIGH (ref 70–99)
Potassium: 4 mmol/L (ref 3.5–5.1)
Sodium: 143 mmol/L (ref 135–145)

## 2019-06-25 LAB — CBC
HCT: 42.9 % (ref 39.0–52.0)
Hemoglobin: 13.9 g/dL (ref 13.0–17.0)
MCH: 31.7 pg (ref 26.0–34.0)
MCHC: 32.4 g/dL (ref 30.0–36.0)
MCV: 97.9 fL (ref 80.0–100.0)
Platelets: 193 10*3/uL (ref 150–400)
RBC: 4.38 MIL/uL (ref 4.22–5.81)
RDW: 13.3 % (ref 11.5–15.5)
WBC: 5.1 10*3/uL (ref 4.0–10.5)
nRBC: 0 % (ref 0.0–0.2)

## 2019-06-25 LAB — SARS CORONAVIRUS 2 (TAT 6-24 HRS): SARS Coronavirus 2: NEGATIVE

## 2019-06-25 MED ORDER — LEVOFLOXACIN IN D5W 500 MG/100ML IV SOLN
500.0000 mg | Freq: Once | INTRAVENOUS | Status: AC
  Administered 2019-06-26: 500 mg via INTRAVENOUS
  Filled 2019-06-25: qty 100

## 2019-06-25 MED ORDER — SODIUM CHLORIDE 0.9 % IV BOLUS (SEPSIS)
250.0000 mL | Freq: Once | INTRAVENOUS | Status: AC
  Administered 2019-06-25: 250 mL via INTRAVENOUS

## 2019-06-25 MED ORDER — SODIUM CHLORIDE 0.9 % IV SOLN
1000.0000 mL | INTRAVENOUS | Status: DC
  Administered 2019-06-25: 1000 mL via INTRAVENOUS

## 2019-06-25 MED ORDER — LEVOFLOXACIN 750 MG PO TABS: 750.0000 mg | ORAL_TABLET | Freq: Every day | ORAL | 0 refills | Status: DC

## 2019-06-25 NOTE — ED Provider Notes (Signed)
[facility] EMERGENCY DEPARTMENT Provider Note   CSN: [id number] Arrival date & time: 06/25/19  1257     History Chief Complaint  Patient presents with   Altered Mental Status    Robert Salinas. is a 86 y.o. male.  HPI Patient is sent from nursing home facility with report of altered mental status but limited details.  I have been able to speak with the patient's daughter-in-law who is power of attorney in conjunction with her husband.  She reports for 2 days that they have reported that he is not eating much at all.  They report he has been slightly confused and sleeping more than usual.  She reports at baseline he is usually pretty alert and active although has severe dementia.  She reports he often does not recognize his family members.  She reports at times when he has gotten this way is been because of a UTI.  He also informs that the patient was diagnosed with Covid 2 weeks ago.  Patient's nurse reports that she was told he had Covid a month ago.  Patient's daughter-in-law reports however that he never seem to develop any symptoms with it.    Past Medical History:  Diagnosis Date   Alzheimer disease ([place])    Atrial fibrillation ([place])    BPH (benign prostatic hyperplasia)    Chickenpox    Chronic kidney disease    COLONIC POLYPS, HX OF 06/09/2009   Dementia ([place])    Diabetes mellitus    Hx of colonic polyps    Hyperlipidemia    Hypertension    Hypothyroidism    Hypothyroidism    Thrombocytopenia ([place])     Patient Active Problem List   Diagnosis Date Noted   UTI (urinary tract infection) 03/20/2017   DNR (do not resuscitate) 12/08/2013   Hypothyroid 10/09/2011   CKD (chronic kidney disease), stage III 02/21/2011   Diabetes mellitus type II, controlled ([place]) 06/09/2009   Hyperlipidemia 06/09/2009   ALZHEIMER'S DISEASE, EARLY 06/09/2009   Essential hypertension 06/09/2009   Atrial fibrillation ([place]) 06/09/2009    Past  Surgical History:  Procedure Laterality Date   APPENDECTOMY     PROSTATECTOMY     BPH cause?       No family history on file.  Social History   Tobacco Use   Smoking status: Never Smoker   Smokeless tobacco: Never Used  Substance Use Topics   Alcohol use: No   Drug use: No    Home Medications Prior to Admission medications   Medication Sig Start Date End Date Taking? Authorizing Provider  amLODipine (NORVASC) 2.5 MG tablet Take 1 tablet (2.5 mg total) by mouth daily. 03/15/17   [name], MD  aspirin 81 MG tablet Take 1 tablet (81 mg total) by mouth daily. 03/03/16   [name], MD  atorvastatin (LIPITOR) 10 MG tablet TAKE 1 TABLET DAILY 08/13/15   [name], MD  donepezil (ARICEPT) 10 MG tablet TAKE 1 TABLET AT BEDTIME 03/13/16   [name], MD  glucose blood (ONETOUCH VERIO) test strip Use to test blood sugars daily. Dx: E11.9 10/27/15   [name], MD  Lancets [facility] ULTRASOFT) lancets Use to test blood sugars daily. Dx: E11.9 10/27/15   [name], MD  levofloxacin (LEVAQUIN) 750 MG tablet Take 1 tablet (750 mg total) by mouth daily. X 7 days 06/25/19   Charlesetta Shanks, MD  levothyroxine (SYNTHROID, LEVOTHROID) 75 MCG tablet Take 1 tablet (75  mcg total) by mouth daily before breakfast. 05/14/17   [name], MD  metFORMIN (GLUCOPHAGE) 500 MG tablet Take 0.5 tablets (250 mg total) by mouth 2 (two) times daily with a meal. 04/10/17   [name], MD    Allergies    Patient has no known allergies.  Review of Systems   Review of Systems 10 Systems reviewed and are negative for acute change except as noted in the HPI.  Physical Exam Updated Vital Signs BP (!) 145/70    Pulse 71    Temp (!) 97.3 F (36.3 C) (Oral)    Resp 15    SpO2 100%   Physical Exam Constitutional:      Comments: Patient is well-nourished well-developed and in good condition for physical age.  No respiratory distress.  He is resting as I  enter the room but awakens to voice.  He does interact with me and answers questions although he does not seem to have very good recall.  HENT:     Head: Normocephalic and atraumatic.     Nose: Nose normal.     Mouth/Throat:     Mouth: Mucous membranes are moist.     Pharynx: Oropharynx is clear.  Eyes:     Extraocular Movements: Extraocular movements intact.  Neck:     Comments: Patient does have significant appearance of arthritic changes at the neck holding his head somewhat forward with prominent hypertrophy around the C4-C5 area. Cardiovascular:     Rate and Rhythm: Normal rate and regular rhythm.     Heart sounds: Normal heart sounds.  Pulmonary:     Effort: Pulmonary effort is normal.     Breath sounds: Normal breath sounds.  Abdominal:     General: There is no distension.     Palpations: Abdomen is soft.     Tenderness: There is no abdominal tenderness. There is no guarding.  Musculoskeletal:     Comments: Feet are very dry and scaling.  No wounds on either foot and no appearance of cellulitis although with extensive dry scaling and what appears likely fungal diffusely over the feet there is some diffuse erythema of the toes and soles although this does not appear cellulitic.  Lower legs have no edema.  Skin condition of lower legs is good.  No effusions at the knees.  Patient has a healing ulcer or abrasion on the left lateral elbow.  This is nearly healed and innocuous in appearance at this time.  No effusion at the joints.  Neurological:     Comments: Patient does go to sleep easily.  He however awakens to voice and answers questions.  Albeit, he does not know the answer to many questions he does admit this or try to generate an answer.  No focal motor deficit.     ED Results / Procedures / Treatments   Labs (all labs ordered are listed, but only abnormal results are displayed) Labs Reviewed  BASIC METABOLIC PANEL - Abnormal; Notable for the following components:       Result Value   Glucose, Bld 121 (*)    BUN 38 (*)    Creatinine, Ser 1.85 (*)    GFR calc non Af Amer 31 (*)    GFR calc Af Amer 36 (*)    All other components within normal limits  URINALYSIS, ROUTINE W REFLEX MICROSCOPIC - Abnormal; Notable for the following components:   APPearance CLOUDY (*)    Hgb urine dipstick SMALL (*)  Protein, ur 100 (*)    Leukocytes,Ua LARGE (*)    WBC, UA >50 (*)    Bacteria, UA FEW (*)    All other components within normal limits  SARS CORONAVIRUS 2 (TAT 6-24 HRS)  URINE CULTURE  CBC    EKG None  Radiology CT Head Wo Contrast  Result Date: 06/25/2019 CLINICAL DATA:  Altered mental status. Neck pain. EXAM: CT HEAD WITHOUT CONTRAST CT CERVICAL SPINE WITHOUT CONTRAST TECHNIQUE: Multidetector CT imaging of the head and cervical spine was performed following the standard protocol without intravenous contrast. Multiplanar CT image reconstructions of the cervical spine were also generated. COMPARISON:  None. FINDINGS: CT HEAD FINDINGS Brain: No acute intracranial hemorrhage. No focal mass lesion. No CT evidence of acute infarction. No midline shift or mass effect. No hydrocephalus. Basilar cisterns are patent. There are periventricular and subcortical white matter hypodensities. Generalized cortical atrophy. Vascular: No hyperdense vessel or unexpected calcification. Skull: Normal. Negative for fracture or focal lesion. Sinuses/Orbits: Paranasal sinuses and mastoid air cells are clear. Orbits are clear. Other: None. CT CERVICAL SPINE FINDINGS Alignment: Normal alignment of the cervical vertebral bodies. Skull base and vertebrae: Normal craniocervical junction. No loss of vertebral body height or disc height. Normal facet articulation. No evidence of fracture. Soft tissues and spinal canal: No prevertebral soft tissue swelling. No perispinal or epidural hematoma. Disc levels: Disc space narrowing and endplate osteophytosis from C5-C7 with bulky osteophytes. No  subluxation. Upper chest: Clear Other: None IMPRESSION: 1. No acute intracranial findings. 2. Atrophy and white matter microvascular disease. 3. No cervical spine fracture. 4. Multilevel disc osteophytic disease. Electronically Signed   By: Suzy Bouchard M.D.   On: 06/25/2019 19:02   CT Cervical Spine Wo Contrast  Result Date: 06/25/2019 CLINICAL DATA:  Altered mental status. Neck pain. EXAM: CT HEAD WITHOUT CONTRAST CT CERVICAL SPINE WITHOUT CONTRAST TECHNIQUE: Multidetector CT imaging of the head and cervical spine was performed following the standard protocol without intravenous contrast. Multiplanar CT image reconstructions of the cervical spine were also generated. COMPARISON:  None. FINDINGS: CT HEAD FINDINGS Brain: No acute intracranial hemorrhage. No focal mass lesion. No CT evidence of acute infarction. No midline shift or mass effect. No hydrocephalus. Basilar cisterns are patent. There are periventricular and subcortical white matter hypodensities. Generalized cortical atrophy. Vascular: No hyperdense vessel or unexpected calcification. Skull: Normal. Negative for fracture or focal lesion. Sinuses/Orbits: Paranasal sinuses and mastoid air cells are clear. Orbits are clear. Other: None. CT CERVICAL SPINE FINDINGS Alignment: Normal alignment of the cervical vertebral bodies. Skull base and vertebrae: Normal craniocervical junction. No loss of vertebral body height or disc height. Normal facet articulation. No evidence of fracture. Soft tissues and spinal canal: No prevertebral soft tissue swelling. No perispinal or epidural hematoma. Disc levels: Disc space narrowing and endplate osteophytosis from C5-C7 with bulky osteophytes. No subluxation. Upper chest: Clear Other: None IMPRESSION: 1. No acute intracranial findings. 2. Atrophy and white matter microvascular disease. 3. No cervical spine fracture. 4. Multilevel disc osteophytic disease. Electronically Signed   By: Suzy Bouchard M.D.   On:  06/25/2019 19:02   DG Chest Port 1 View  Result Date: 06/25/2019 CLINICAL DATA:  Altered mental status EXAM: PORTABLE CHEST 1 VIEW COMPARISON:  06/18/2018 FINDINGS: Cardiac shadow is stable. Tortuosity of the thoracic aorta is noted. Mild calcifications are seen. The lungs are well aerated bilaterally. No focal infiltrate or sizable effusion is seen. No acute bony abnormality is noted. IMPRESSION: No acute abnormality seen. Electronically Signed  By: Inez Catalina M.D.   On: 06/25/2019 17:32    Procedures Procedures (including critical care time)  Medications Ordered in ED Medications  sodium chloride 0.9 % bolus 250 mL (0 mLs Intravenous Stopped 06/25/19 1847)    Followed by  0.9 %  sodium chloride infusion (0 mLs Intravenous Stopped 06/25/19 2114)  levofloxacin (LEVAQUIN) IVPB 500 mg (has no administration in time range)    ED Course  I have reviewed the triage vital signs and the nursing notes.  Pertinent labs & imaging results that were available during my care of the patient were reviewed by me and considered in my medical decision making (see chart for details).    MDM Rules/Calculators/A&P                      Patient presents with general constitutional symptoms with decreased oral intake and less interactive.  Vital signs are stable.  No hypoxia, fever, hypotension or tachycardia.  Labs within normal limits.  Patient does have grossly positive urinalysis.  Patient at baseline has significant Alzheimer's disease with poor recognition of family members but otherwise fairly good physical function.  At this time, with no signs of respiratory distress and otherwise normal physical exam, symptoms appear secondary to UTI with grossly positive urinalysis.  Will get culture.  Review of prior culture shows Enterococcus faecalis with susceptibility to Levaquin.  Will give first dose IV and discharged with outpatient Levaquin.  Return instructions included in discharge instructions. Final  Clinical Impression(s) / ED Diagnoses Final diagnoses:  Somnolence  Acute cystitis with hematuria    Rx / DC Orders ED Discharge Orders         Ordered    levofloxacin (LEVAQUIN) 750 MG tablet  Daily     06/25/19 2331           Charlesetta Shanks, MD 06/25/19 2339

## 2019-06-25 NOTE — ED Notes (Signed)
Pt found climbing out of bed. Pt had removed all cardiac monitoring, IV, and condom cath. RN and NT put pt back on monitor and put on a new condom cath.

## 2019-06-25 NOTE — ED Notes (Signed)
Pt given crackers and water. 

## 2019-06-25 NOTE — ED Notes (Signed)
Patient transported to CT 

## 2019-06-25 NOTE — Discharge Instructions (Addendum)
1.  Urinalysis is positive for urinary tract infection.  Start Levaquin as prescribed. 2.  Other labs are normal today.  Please watch for signs of fever, change in vital signs or other concerning changes. 3.  Schedule follow-up with facility physician as soon as possible.

## 2019-06-25 NOTE — ED Triage Notes (Signed)
To triage via EMS.  Onset 3 days not eating or drinking and only saying "yes" when asked question.  Pt normally talkative and able to feed self.  EMS BP 138/72  BS 148 COVID + - 1 month ago.  No COVID symptoms at this time.

## 2019-06-26 NOTE — ED Notes (Signed)
Report given to [facility].

## 2019-06-26 NOTE — ED Notes (Signed)
Attempted to call report

## 2019-06-26 NOTE — ED Notes (Signed)
Called [facility] for transport to the [facility]--Andrea Colglazier

## 2019-07-02 LAB — SUSCEPTIBILITY RESULT

## 2019-07-02 LAB — SUSCEPTIBILITY, AER + ANAEROB

## 2019-07-04 LAB — URINE CULTURE: Culture: >=100000 — AB

## 2019-07-07 ENCOUNTER — Telehealth: Payer: Self-pay | Admitting: Emergency Medicine

## 2019-07-07 NOTE — Telephone Encounter (Signed)
Post ED Visit - Positive Culture Follow-up  Culture report reviewed by antimicrobial stewardship pharmacist: [place] Team []  Elenor Quinones, Pharm.D. []  Heide Guile, Pharm.D., BCPS AQ-ID []  Parks Neptune, Pharm.D., BCPS []  Alycia Rossetti, Pharm.D., BCPS []  [place], Pharm.D., BCPS, AAHIVP []  Legrand Como, Pharm.D., BCPS, AAHIVP []  Salome Arnt, PharmD, BCPS []  Johnnette Gourd, PharmD, BCPS []  Hughes Better, PharmD, BCPS []  Leeroy Cha, PharmD []  Laqueta Linden, PharmD, BCPS []  Albertina Parr, PharmD  [place] Team []  Leodis Sias, PharmD []  Lindell Spar, PharmD []  Royetta Asal, PharmD []  Graylin Shiver, Rph []  Rema Fendt) Glennon Mac, PharmD []  Arlyn Dunning, PharmD []  Netta Cedars, PharmD []  Dia Sitter, PharmD []  Leone Haven, PharmD []  Gretta Arab, PharmD []  Theodis Shove, PharmD []  Peggyann Juba, PharmD []  Reuel Boom, PharmD   Positive urine culture Treated with levofloxacin, organism sensitive to the same and no further patient follow-up is required at this time.  Hazle Nordmann 07/07/2019, 9:04 AM

## 2019-12-31 ENCOUNTER — Inpatient Hospital Stay (HOSPITAL_COMMUNITY): Payer: Medicare Other

## 2019-12-31 ENCOUNTER — Other Ambulatory Visit: Payer: Self-pay

## 2019-12-31 ENCOUNTER — Encounter (HOSPITAL_COMMUNITY): Payer: Self-pay

## 2019-12-31 ENCOUNTER — Emergency Department (HOSPITAL_COMMUNITY): Payer: Medicare Other

## 2019-12-31 ENCOUNTER — Inpatient Hospital Stay (HOSPITAL_COMMUNITY)
Admission: EM | Admit: 2019-12-31 | Discharge: 2020-01-05 | DRG: 065 | Disposition: A | Discharge status: Home Health | Payer: Medicare Other | Attending: Internal Medicine | Admitting: Internal Medicine

## 2019-12-31 DIAGNOSIS — I480 Paroxysmal atrial fibrillation: Secondary | ICD-10-CM | POA: Diagnosis present

## 2019-12-31 DIAGNOSIS — E039 Hypothyroidism, unspecified: Secondary | ICD-10-CM | POA: Diagnosis present

## 2019-12-31 DIAGNOSIS — R4182 Altered mental status, unspecified: Secondary | ICD-10-CM | POA: Diagnosis present

## 2019-12-31 DIAGNOSIS — R29717 NIHSS score 17: Secondary | ICD-10-CM | POA: Diagnosis not present

## 2019-12-31 DIAGNOSIS — I1310 Hypertensive heart and chronic kidney disease without heart failure, with stage 1 through stage 4 chronic kidney disease, or unspecified chronic kidney disease: Secondary | ICD-10-CM | POA: Diagnosis present

## 2019-12-31 DIAGNOSIS — R29707 NIHSS score 7: Secondary | ICD-10-CM | POA: Diagnosis not present

## 2019-12-31 DIAGNOSIS — Z66 Do not resuscitate: Secondary | ICD-10-CM | POA: Diagnosis present

## 2019-12-31 DIAGNOSIS — R296 Repeated falls: Secondary | ICD-10-CM | POA: Diagnosis present

## 2019-12-31 DIAGNOSIS — I63412 Cerebral infarction due to embolism of left middle cerebral artery: Secondary | ICD-10-CM | POA: Diagnosis not present

## 2019-12-31 DIAGNOSIS — Z86010 Personal history of colonic polyps: Secondary | ICD-10-CM

## 2019-12-31 DIAGNOSIS — N1832 Chronic kidney disease, stage 3b: Secondary | ICD-10-CM | POA: Diagnosis present

## 2019-12-31 DIAGNOSIS — Z8619 Personal history of other infectious and parasitic diseases: Secondary | ICD-10-CM

## 2019-12-31 DIAGNOSIS — I6389 Other cerebral infarction: Secondary | ICD-10-CM

## 2019-12-31 DIAGNOSIS — I6340 Cerebral infarction due to embolism of unspecified cerebral artery: Secondary | ICD-10-CM | POA: Insufficient documentation

## 2019-12-31 DIAGNOSIS — R569 Unspecified convulsions: Secondary | ICD-10-CM | POA: Diagnosis not present

## 2019-12-31 DIAGNOSIS — R29710 NIHSS score 10: Secondary | ICD-10-CM | POA: Diagnosis not present

## 2019-12-31 DIAGNOSIS — I6522 Occlusion and stenosis of left carotid artery: Secondary | ICD-10-CM

## 2019-12-31 DIAGNOSIS — Z87440 Personal history of urinary (tract) infections: Secondary | ICD-10-CM

## 2019-12-31 DIAGNOSIS — N1831 Chronic kidney disease, stage 3a: Secondary | ICD-10-CM

## 2019-12-31 DIAGNOSIS — R404 Transient alteration of awareness: Secondary | ICD-10-CM

## 2019-12-31 DIAGNOSIS — C7931 Secondary malignant neoplasm of brain: Secondary | ICD-10-CM | POA: Diagnosis present

## 2019-12-31 DIAGNOSIS — Z9079 Acquired absence of other genital organ(s): Secondary | ICD-10-CM

## 2019-12-31 DIAGNOSIS — Z515 Encounter for palliative care: Secondary | ICD-10-CM | POA: Diagnosis not present

## 2019-12-31 DIAGNOSIS — F0280 Dementia in other diseases classified elsewhere without behavioral disturbance: Secondary | ICD-10-CM | POA: Diagnosis present

## 2019-12-31 DIAGNOSIS — R29706 NIHSS score 6: Secondary | ICD-10-CM | POA: Diagnosis not present

## 2019-12-31 DIAGNOSIS — D496 Neoplasm of unspecified behavior of brain: Secondary | ICD-10-CM | POA: Diagnosis not present

## 2019-12-31 DIAGNOSIS — R29720 NIHSS score 20: Secondary | ICD-10-CM | POA: Diagnosis present

## 2019-12-31 DIAGNOSIS — E1122 Type 2 diabetes mellitus with diabetic chronic kidney disease: Secondary | ICD-10-CM | POA: Diagnosis present

## 2019-12-31 DIAGNOSIS — E861 Hypovolemia: Secondary | ICD-10-CM | POA: Diagnosis not present

## 2019-12-31 DIAGNOSIS — R1310 Dysphagia, unspecified: Secondary | ICD-10-CM | POA: Diagnosis present

## 2019-12-31 DIAGNOSIS — E785 Hyperlipidemia, unspecified: Secondary | ICD-10-CM | POA: Diagnosis present

## 2019-12-31 DIAGNOSIS — R451 Restlessness and agitation: Secondary | ICD-10-CM | POA: Diagnosis not present

## 2019-12-31 DIAGNOSIS — Z79899 Other long term (current) drug therapy: Secondary | ICD-10-CM

## 2019-12-31 DIAGNOSIS — G301 Alzheimer's disease with late onset: Secondary | ICD-10-CM | POA: Diagnosis not present

## 2019-12-31 DIAGNOSIS — Z7951 Long term (current) use of inhaled steroids: Secondary | ICD-10-CM

## 2019-12-31 DIAGNOSIS — D330 Benign neoplasm of brain, supratentorial: Secondary | ICD-10-CM

## 2019-12-31 DIAGNOSIS — R4701 Aphasia: Secondary | ICD-10-CM | POA: Diagnosis present

## 2019-12-31 DIAGNOSIS — Z20822 Contact with and (suspected) exposure to covid-19: Secondary | ICD-10-CM | POA: Diagnosis present

## 2019-12-31 DIAGNOSIS — G309 Alzheimer's disease, unspecified: Secondary | ICD-10-CM | POA: Diagnosis present

## 2019-12-31 DIAGNOSIS — F0281 Dementia in other diseases classified elsewhere with behavioral disturbance: Secondary | ICD-10-CM | POA: Diagnosis not present

## 2019-12-31 DIAGNOSIS — E1165 Type 2 diabetes mellitus with hyperglycemia: Secondary | ICD-10-CM | POA: Diagnosis present

## 2019-12-31 DIAGNOSIS — I63512 Cerebral infarction due to unspecified occlusion or stenosis of left middle cerebral artery: Secondary | ICD-10-CM | POA: Diagnosis present

## 2019-12-31 DIAGNOSIS — Z79890 Hormone replacement therapy (postmenopausal): Secondary | ICD-10-CM

## 2019-12-31 DIAGNOSIS — G459 Transient cerebral ischemic attack, unspecified: Secondary | ICD-10-CM | POA: Diagnosis present

## 2019-12-31 DIAGNOSIS — I4821 Permanent atrial fibrillation: Secondary | ICD-10-CM

## 2019-12-31 LAB — DIFFERENTIAL
Abs Immature Granulocytes: 0.01 10*3/uL (ref 0.00–0.07)
Basophils Absolute: 0 10*3/uL (ref 0.0–0.1)
Basophils Relative: 0 %
Eosinophils Absolute: 0 10*3/uL (ref 0.0–0.5)
Eosinophils Relative: 1 %
Immature Granulocytes: 0 %
Lymphocytes Relative: 26 %
Lymphs Abs: 1.2 10*3/uL (ref 0.7–4.0)
Monocytes Absolute: 0.3 10*3/uL (ref 0.1–1.0)
Monocytes Relative: 5 %
Neutro Abs: 3.1 10*3/uL (ref 1.7–7.7)
Neutrophils Relative %: 68 %

## 2019-12-31 LAB — CBC
HCT: 35.4 % — ABNORMAL LOW (ref 39.0–52.0)
Hemoglobin: 11.2 g/dL — ABNORMAL LOW (ref 13.0–17.0)
MCH: 29.6 pg (ref 26.0–34.0)
MCHC: 31.6 g/dL (ref 30.0–36.0)
MCV: 93.7 fL (ref 80.0–100.0)
Platelets: 171 10*3/uL (ref 150–400)
RBC: 3.78 MIL/uL — ABNORMAL LOW (ref 4.22–5.81)
RDW: 13.8 % (ref 11.5–15.5)
WBC: 4.6 10*3/uL (ref 4.0–10.5)
nRBC: 0 % (ref 0.0–0.2)

## 2019-12-31 LAB — CBG MONITORING, ED
Glucose-Capillary: 275 mg/dL — ABNORMAL HIGH (ref 70–99)
Glucose-Capillary: 195 mg/dL — ABNORMAL HIGH (ref 70–99)

## 2019-12-31 LAB — COMPREHENSIVE METABOLIC PANEL
ALT: 21 U/L (ref 0–44)
AST: 24 U/L (ref 15–41)
Albumin: 3.2 g/dL — ABNORMAL LOW (ref 3.5–5.0)
Alkaline Phosphatase: 98 U/L (ref 38–126)
Anion gap: 10 (ref 5–15)
BUN: 35 mg/dL — ABNORMAL HIGH (ref 8–23)
CO2: 23 mmol/L (ref 22–32)
Calcium: 8.8 mg/dL — ABNORMAL LOW (ref 8.9–10.3)
Chloride: 108 mmol/L (ref 98–111)
Creatinine, Ser: 1.69 mg/dL — ABNORMAL HIGH (ref 0.61–1.24)
GFR calc Af Amer: 41 mL/min — ABNORMAL LOW (ref >60)
GFR calc non Af Amer: 35 mL/min — ABNORMAL LOW (ref >60)
Glucose, Bld: 307 mg/dL — ABNORMAL HIGH (ref 70–99)
Potassium: 5 mmol/L (ref 3.5–5.1)
Sodium: 141 mmol/L (ref 135–145)
Total Bilirubin: 0.7 mg/dL (ref 0.3–1.2)
Total Protein: 6.1 g/dL — ABNORMAL LOW (ref 6.5–8.1)

## 2019-12-31 LAB — I-STAT CHEM 8, ED
BUN: 37 mg/dL — ABNORMAL HIGH (ref 8–23)
Calcium, Ion: 1.11 mmol/L — ABNORMAL LOW (ref 1.15–1.40)
Chloride: 107 mmol/L (ref 98–111)
Creatinine, Ser: 1.8 mg/dL — ABNORMAL HIGH (ref 0.61–1.24)
Glucose, Bld: 297 mg/dL — ABNORMAL HIGH (ref 70–99)
HCT: 33 % — ABNORMAL LOW (ref 39.0–52.0)
Hemoglobin: 11.2 g/dL — ABNORMAL LOW (ref 13.0–17.0)
Potassium: 4.9 mmol/L (ref 3.5–5.1)
Sodium: 142 mmol/L (ref 135–145)
TCO2: 24 mmol/L (ref 22–32)

## 2019-12-31 LAB — ECHOCARDIOGRAM COMPLETE
Height: 70 in
Weight: 2973.56 oz

## 2019-12-31 LAB — APTT: aPTT: 35 seconds (ref 24–36)

## 2019-12-31 LAB — PROTIME-INR
INR: 1 (ref 0.8–1.2)
Prothrombin Time: 13.2 seconds (ref 11.4–15.2)

## 2019-12-31 LAB — SARS CORONAVIRUS 2 BY RT PCR (HOSPITAL ORDER, PERFORMED IN ~~LOC~~ HOSPITAL LAB): SARS Coronavirus 2: NEGATIVE

## 2019-12-31 MED ORDER — IPRATROPIUM-ALBUTEROL 0.5-2.5 (3) MG/3ML IN SOLN: 3.0000 mL | Freq: Four times a day (QID) | RESPIRATORY_TRACT | Status: DC | PRN

## 2019-12-31 MED ORDER — BOOST PO LIQD
237.0000 mL | Freq: Two times a day (BID) | ORAL | Status: DC
  Administered 2020-01-02 – 2020-01-05 (×7): 237 mL via ORAL
  Filled 2019-12-31 (×9): qty 237

## 2019-12-31 MED ORDER — SODIUM CHLORIDE 0.9% FLUSH
3.0000 mL | Freq: Once | INTRAVENOUS | Status: AC
Start: 2019-12-31 — End: 2019-12-31
  Administered 2019-12-31: 3 mL via INTRAVENOUS

## 2019-12-31 MED ORDER — LEVOTHYROXINE SODIUM 100 MCG/5ML IV SOLN
44.0000 ug | Freq: Every day | INTRAVENOUS | Status: DC
  Administered 2020-01-01: 44 ug via INTRAVENOUS
  Filled 2019-12-31 (×2): qty 5

## 2019-12-31 MED ORDER — SODIUM CHLORIDE 0.9 % IV BOLUS
500.0000 mL | Freq: Once | INTRAVENOUS | Status: AC
  Administered 2019-12-31: 500 mL via INTRAVENOUS

## 2019-12-31 MED ORDER — INSULIN ASPART 100 UNIT/ML ~~LOC~~ SOLN
0.0000 [IU] | Freq: Three times a day (TID) | SUBCUTANEOUS | Status: DC
  Administered 2019-12-31: 2 [IU] via SUBCUTANEOUS
  Administered 2020-01-02: 1 [IU] via SUBCUTANEOUS
  Administered 2020-01-03: 7 [IU] via SUBCUTANEOUS
  Administered 2020-01-03 – 2020-01-04 (×2): 3 [IU] via SUBCUTANEOUS
  Administered 2020-01-04: 5 [IU] via SUBCUTANEOUS

## 2019-12-31 MED ORDER — ACETAMINOPHEN 650 MG RE SUPP: 650.0000 mg | RECTAL | Status: DC | PRN

## 2019-12-31 MED ORDER — LEVOTHYROXINE SODIUM 88 MCG PO TABS: 88.0000 ug | ORAL_TABLET | Freq: Every day | ORAL | Status: DC

## 2019-12-31 MED ORDER — ASPIRIN EC 81 MG PO TBEC: 81.0000 mg | DELAYED_RELEASE_TABLET | Freq: Every day | ORAL | Status: DC

## 2019-12-31 MED ORDER — ASPIRIN EC 325 MG PO TBEC
325.0000 mg | DELAYED_RELEASE_TABLET | Freq: Every day | ORAL | Status: DC
  Administered 2020-01-02 – 2020-01-05 (×4): 325 mg via ORAL
  Filled 2019-12-31 (×4): qty 1

## 2019-12-31 MED ORDER — AMLODIPINE BESYLATE 5 MG PO TABS: 2.5000 mg | ORAL_TABLET | Freq: Every day | ORAL | Status: DC

## 2019-12-31 MED ORDER — HEPARIN SODIUM (PORCINE) 5000 UNIT/ML IJ SOLN
5000.0000 [IU] | Freq: Two times a day (BID) | INTRAMUSCULAR | Status: DC
  Administered 2019-12-31 – 2020-01-05 (×10): 5000 [IU] via SUBCUTANEOUS
  Filled 2019-12-31 (×10): qty 1

## 2019-12-31 MED ORDER — VITAMIN D 25 MCG (1000 UNIT) PO TABS: 2000.0000 [IU] | ORAL_TABLET | Freq: Every day | ORAL | Status: DC

## 2019-12-31 MED ORDER — SODIUM CHLORIDE 0.9 % IV SOLN: INTRAVENOUS | Status: AC

## 2019-12-31 MED ORDER — STROKE: EARLY STAGES OF RECOVERY BOOK: Freq: Once | Status: DC

## 2019-12-31 MED ORDER — QUETIAPINE FUMARATE 25 MG PO TABS
25.0000 mg | ORAL_TABLET | Freq: Two times a day (BID) | ORAL | Status: DC | PRN
  Filled 2019-12-31: qty 1

## 2019-12-31 MED ORDER — ALBUTEROL SULFATE HFA 108 (90 BASE) MCG/ACT IN AERS: 2.0000 | INHALATION_SPRAY | Freq: Four times a day (QID) | RESPIRATORY_TRACT | Status: DC | PRN

## 2019-12-31 MED ORDER — IOHEXOL 350 MG/ML SOLN
100.0000 mL | Freq: Once | INTRAVENOUS | Status: AC | PRN
  Administered 2019-12-31: 100 mL via INTRAVENOUS

## 2019-12-31 MED ORDER — ACETAMINOPHEN 160 MG/5ML PO SOLN: 650.0000 mg | ORAL | Status: DC | PRN

## 2019-12-31 MED ORDER — ASPIRIN 300 MG RE SUPP
300.0000 mg | Freq: Every day | RECTAL | Status: DC
  Administered 2020-01-01: 300 mg via RECTAL
  Filled 2019-12-31 (×2): qty 1

## 2019-12-31 MED ORDER — DICLOFENAC SODIUM 1 % EX GEL
2.0000 g | Freq: Three times a day (TID) | CUTANEOUS | Status: DC | PRN
  Filled 2019-12-31: qty 100

## 2019-12-31 MED ORDER — MONTELUKAST SODIUM 10 MG PO TABS: 10.0000 mg | ORAL_TABLET | Freq: Every day | ORAL | Status: DC

## 2019-12-31 MED ORDER — SENNOSIDES-DOCUSATE SODIUM 8.6-50 MG PO TABS: 1.0000 | ORAL_TABLET | Freq: Every evening | ORAL | Status: DC | PRN

## 2019-12-31 MED ORDER — DEXTROMETHORPHAN-GUAIFENESIN 10-100 MG/5ML PO LIQD
5.0000 mL | Freq: Three times a day (TID) | ORAL | Status: DC | PRN
  Filled 2019-12-31: qty 5

## 2019-12-31 MED ORDER — ATORVASTATIN CALCIUM 10 MG PO TABS
10.0000 mg | ORAL_TABLET | Freq: Every day | ORAL | Status: DC
  Administered 2020-01-02 – 2020-01-05 (×4): 10 mg via ORAL
  Filled 2019-12-31 (×4): qty 1

## 2019-12-31 MED ORDER — ACETAMINOPHEN 325 MG PO TABS: 650.0000 mg | ORAL_TABLET | ORAL | Status: DC | PRN

## 2019-12-31 MED ORDER — DONEPEZIL HCL 10 MG PO TABS
10.0000 mg | ORAL_TABLET | Freq: Every day | ORAL | Status: DC
  Administered 2020-01-02 – 2020-01-04 (×3): 10 mg via ORAL
  Filled 2019-12-31 (×5): qty 1

## 2019-12-31 MED ORDER — AMMONIUM LACTATE 12 % EX LOTN
1.0000 "application " | TOPICAL_LOTION | Freq: Every day | CUTANEOUS | Status: DC
  Administered 2020-01-01 – 2020-01-04 (×4): 1 via TOPICAL
  Filled 2019-12-31: qty 225

## 2019-12-31 NOTE — Consult Note (Signed)
Stroke Neurology Consultation Note  Consult Requested by: Dr. Laverta [place]  Reason for Consult: code stroke  Consult Date: 12/31/19  The history was obtained from the EMS, nursing home nurse, and patient daughter-in-law.  During history and examination, all items were able to obtain unless otherwise noted.  History of Present Illness:  Robert Salinas. is a 86 y.o. Caucasian male with PMH of alzheimer's dementia, hypothyroidism, remote AFib on ASA, well-controlled T2DM, and HTN presenting to ED for aphasia and altered mental status.  As per EMS and nursing home nurse, patient at baseline able to talk in sentences, however very confused.  He was able to stand and walk a little bit with walker however most the time he is in wheelchair.  About noon time, patient was last seen at baseline.  However around 12:45 PM when he was wheeled to cafeteria, he was found to be very lethargic, not able to arouse, nonverbal and altered mental status.  EMS was called, on arrival, patient nonverbal, not following commands, BP 120/66, heart rate 66, no A. fib, O2 sats 97%, however, glucose 413.  In ED, glucose 307.  Spoke with daughter-in-law, patient at baseline able to talk words or sentences but confused.  Confirmed that he is in wheelchair most the time.  She stated that 2 weeks ago patient was concerned to have UTI, to 3 days ago he was given antibiotic treatment.  He is DNR/DNI.  LSN: 12:00 noon tPA Given: No: concerning for brain tumor  mRS = 4  Past Medical History:  Diagnosis Date  . Alzheimer disease ([place])   . Atrial fibrillation ([place])   . BPH (benign prostatic hyperplasia)   . Chickenpox   . Chronic kidney disease   . COLONIC POLYPS, HX OF 06/09/2009  . Dementia ([place])   . Diabetes mellitus   . Hx of colonic polyps   . Hyperlipidemia   . Hypertension   . Hypothyroidism   . Hypothyroidism   . Thrombocytopenia ([place])     Past Surgical History:  Procedure Laterality Date  . APPENDECTOMY    .  PROSTATECTOMY     BPH cause?    History reviewed. No pertinent family history.  Social History:  reports that he has never smoked. He has never used smokeless tobacco. He reports that he does not drink alcohol and does not use drugs.  Allergies: No Known Allergies  No current facility-administered medications on file prior to encounter.   Current Outpatient Medications on File Prior to Encounter  Medication Sig Dispense Refill  . amLODipine (NORVASC) 2.5 MG tablet Take 1 tablet (2.5 mg total) by mouth daily. 90 tablet 3  . aspirin 81 MG tablet Take 1 tablet (81 mg total) by mouth daily. 1 tablet   . atorvastatin (LIPITOR) 10 MG tablet TAKE 1 TABLET DAILY 90 tablet 2  . donepezil (ARICEPT) 10 MG tablet TAKE 1 TABLET AT BEDTIME 90 tablet 3  . glucose blood (ONETOUCH VERIO) test strip Use to test blood sugars daily. Dx: E11.9 100 each 12  . Lancets (ONETOUCH ULTRASOFT) lancets Use to test blood sugars daily. Dx: E11.9 100 each 12  . levofloxacin (LEVAQUIN) 750 MG tablet Take 1 tablet (750 mg total) by mouth daily. X 7 days 7 tablet 0  . levothyroxine (SYNTHROID, LEVOTHROID) 75 MCG tablet Take 1 tablet (75 mcg total) by mouth daily before breakfast. 90 tablet 3  . metFORMIN (GLUCOPHAGE) 500 MG tablet Take 0.5 tablets (250 mg total) by mouth 2 (two) times daily with  a meal. 45 tablet 3    Review of Systems: A full ROS was attempted today and was not able to be performed due to aphasia and AMS.  Physical Examination: Pulse Rate:  [69] 69 (07/14 1410) Resp:  [15] 15 (07/14 1410) BP: (140)/(83) 140/83 (07/14 1410) SpO2:  [100 %] 100 % (07/14 1410) Weight:  [84.3 kg] 84.3 kg (07/14 1418)  General - mildly cachectic, well developed, drowsy sleepy.    Ophthalmologic - fundi not visualized due to noncooperation.    Cardiovascular - regular rhythm and rate  Neuro - lethargic, intermittently open eyes, nonverbal, not following commands.  With intermittent eye opening, patient seems able to  gaze bilaterally, however, inconsistently blinking to visual threat bilaterally.  No significant facial droop.  Tongue protrusion not corporative.  Bilateral l upper extremity at least 3/5, symmetrical.  Bilateral lower extremity at least 3-/5, symmetrical. Sensation, coordination and gait not tested.  NIH Stroke Scale  Level Of Consciousness 0=Alert; keenly responsive 1=Arouse to minor stimulation 2=Requires repeated stimulation to arouse or movements to pain 3=postures or unresponsive 2  LOC Questions to Month and Age 35=Answers both questions correctly 1=Answers one question correctly or dysarthria/intubated/trauma/language barrier 2=Answers neither question correctly or aphasia 2  LOC Commands      -Open/Close eyes     -Open/close grip     -Pantomime commands if communication barrier 0=Performs both tasks correctly 1=Performs one task correctly 2=Performs neighter task correctly 2  Best Gaze     -Only assess horizontal gaze 0=Normal 1=Partial gaze palsy 2=Forced deviation, or total gaze paresis 0  Visual 0=No visual loss 1=Partial hemianopia 2=Complete hemianopia 3=Bilateral hemianopia (blind including cortical blindness) 0  Facial Palsy     -Use grimace if obtunded 0=Normal symmetrical movement 1=Minor paralysis (asymmetry) 2=Partial paralysis (lower face) 3=Complete paralysis (upper and lower face) 0  Motor  0=No drift for 10/5 seconds 1=Drift, but does not hit bed 2=Some antigravity effort, hits  bed 3=No effort against gravity, limb falls 4=No movement 0=Amputation/joint fusion Right Arm 1     Leg 1    Left Arm 1     Leg 1  Limb Ataxia     - FNT/HTS 0=Absent or does not understand or paralyzed or amputation/joint fusion 1=Present in one limb 2=Present in two limbs 0  Sensory 0=Normal 1=Mild to moderate sensory loss 2=Severe to total sensory loss or coma/unresponsive 0  Best Language 0=No aphasia, normal 1=Mild to moderate aphasia 2=Severe aphasia 3=Mute,  global aphasia, or coma/unresponsive 3  Dysarthria 0=Normal 1=Mild to moderate 2=Severe, unintelligible or mute/anarthric 0=intubated/unable to test 2  Extinction/Neglect 0=No abnormality 1=visual/tactile/auditory/spatia/personal inattention/Extinction to bilateral simultaneous stimulation 2=Profound neglect/extinction more than 1 modality  2  Total   17     Data Reviewed: CT ANGIO HEAD W OR WO CONTRAST  Result Date: 12/31/2019 CLINICAL DATA:  Code stroke follow-up EXAM: CT ANGIOGRAPHY HEAD AND NECK CT PERFUSION BRAIN TECHNIQUE: Multidetector CT imaging of the head and neck was performed using the standard protocol during bolus administration of intravenous contrast. Multiplanar CT image reconstructions and MIPs were obtained to evaluate the vascular anatomy. Carotid stenosis measurements (when applicable) are obtained utilizing NASCET criteria, using the distal internal carotid diameter as the denominator. Multiphase CT imaging of the brain was performed following IV bolus contrast injection. Subsequent parametric perfusion maps were calculated using RAPID software. CONTRAST:  100 mL Omnipaque 350 COMPARISON:  None. FINDINGS: CTA NECK FINDINGS Aortic arch: Mild calcified plaque along the arch and at the patent great vessel  origins. Right carotid system: Patent. Mild plaque the carotid. There is primarily calcified plaque at the ICA origin causing nearly 70% stenosis. Left carotid system: Patent. Mild plaque along the common carotid. There is mild calcified plaque at the ICA origin without high-grade stenosis. Just beyond the origin, there is additional primarily noncalcified plaque with resulting near occlusion over a short segment with subsequent reconstitution. Vertebral arteries: Patent. Left vertebral artery is dominant. There is calcified plaque at the vertebral artery origins causing mild stenosis. Skeleton: Multilevel degenerative changes of the cervical spine. Other neck: No mass or  adenopathy. Upper chest: Included upper lungs are clear. Review of the MIP images confirms the above findings CTA HEAD FINDINGS Anterior circulation: Intracranial internal carotid arteries patent with calcified plaque. There is moderate to severe stenosis of the proximal supraclinoid portions bilaterally. Anterior cerebral arteries are patent. There is severe stenosis of the left A1 ACA. Middle cerebral arteries are patent with left M1 MCA atherosclerotic irregularity. Posterior circulation: Intracranial vertebral arteries are patent minimal calcified plaque. Basilar artery is patent with atherosclerotic irregularity and mild to moderate stenosis. Posterior cerebral arteries are patent atherosclerotic irregularity and mild stenosis. Venous sinuses: As permitted by contrast timing, patent. Corresponding to suspected abnormality on noncontrast CT, there remains a possible mass lesion along the left precentral gyrus. Review of the MIP images confirms the above findings CT Brain Perfusion Findings: CBF (<30%) Volume: 82mL Perfusion (Tmax>6.0s) volume: 53mL Mismatch Volume: 14mL Infarction Location: None IMPRESSION: No large vessel occlusion. Plaque at the right ICA origin causes nearly 70% stenosis. There is short segment near occlusion of the proximal left ICA with reconstitution. Intracranial atherosclerosis including moderate to severe stenoses of the proximal supraclinoid ICAs and severe stenosis of the left A1 ACA. Perfusion imaging demonstrates no evidence of core infarction. There is calculated territory at risk in the left MCA territory and left MCA/PCA watershed. Persistent possible small mass lesion along the left precentral gyrus. MRI with contrast is recommended for further evaluation. These results were called by telephone at the time of interpretation on 12/31/2019 at 2:02 pm to provider [facility] , who verbally acknowledged these results. Electronically Signed   By: Macy Mis M.D.   On: 12/31/2019  14:17   CT ANGIO NECK W OR WO CONTRAST  Result Date: 12/31/2019 CLINICAL DATA:  Code stroke follow-up EXAM: CT ANGIOGRAPHY HEAD AND NECK CT PERFUSION BRAIN TECHNIQUE: Multidetector CT imaging of the head and neck was performed using the standard protocol during bolus administration of intravenous contrast. Multiplanar CT image reconstructions and MIPs were obtained to evaluate the vascular anatomy. Carotid stenosis measurements (when applicable) are obtained utilizing NASCET criteria, using the distal internal carotid diameter as the denominator. Multiphase CT imaging of the brain was performed following IV bolus contrast injection. Subsequent parametric perfusion maps were calculated using RAPID software. CONTRAST:  100 mL Omnipaque 350 COMPARISON:  None. FINDINGS: CTA NECK FINDINGS Aortic arch: Mild calcified plaque along the arch and at the patent great vessel origins. Right carotid system: Patent. Mild plaque the carotid. There is primarily calcified plaque at the ICA origin causing nearly 70% stenosis. Left carotid system: Patent. Mild plaque along the common carotid. There is mild calcified plaque at the ICA origin without high-grade stenosis. Just beyond the origin, there is additional primarily noncalcified plaque with resulting near occlusion over a short segment with subsequent reconstitution. Vertebral arteries: Patent. Left vertebral artery is dominant. There is calcified plaque at the vertebral artery origins causing mild stenosis. Skeleton: Multilevel degenerative changes  of the cervical spine. Other neck: No mass or adenopathy. Upper chest: Included upper lungs are clear. Review of the MIP images confirms the above findings CTA HEAD FINDINGS Anterior circulation: Intracranial internal carotid arteries patent with calcified plaque. There is moderate to severe stenosis of the proximal supraclinoid portions bilaterally. Anterior cerebral arteries are patent. There is severe stenosis of the left A1  ACA. Middle cerebral arteries are patent with left M1 MCA atherosclerotic irregularity. Posterior circulation: Intracranial vertebral arteries are patent minimal calcified plaque. Basilar artery is patent with atherosclerotic irregularity and mild to moderate stenosis. Posterior cerebral arteries are patent atherosclerotic irregularity and mild stenosis. Venous sinuses: As permitted by contrast timing, patent. Corresponding to suspected abnormality on noncontrast CT, there remains a possible mass lesion along the left precentral gyrus. Review of the MIP images confirms the above findings CT Brain Perfusion Findings: CBF (<30%) Volume: 48mL Perfusion (Tmax>6.0s) volume: 70mL Mismatch Volume: 67mL Infarction Location: None IMPRESSION: No large vessel occlusion. Plaque at the right ICA origin causes nearly 70% stenosis. There is short segment near occlusion of the proximal left ICA with reconstitution. Intracranial atherosclerosis including moderate to severe stenoses of the proximal supraclinoid ICAs and severe stenosis of the left A1 ACA. Perfusion imaging demonstrates no evidence of core infarction. There is calculated territory at risk in the left MCA territory and left MCA/PCA watershed. Persistent possible small mass lesion along the left precentral gyrus. MRI with contrast is recommended for further evaluation. These results were called by telephone at the time of interpretation on 12/31/2019 at 2:02 pm to provider [facility] , who verbally acknowledged these results. Electronically Signed   By: Macy Mis M.D.   On: 12/31/2019 14:17   CT CEREBRAL PERFUSION W CONTRAST  Result Date: 12/31/2019 CLINICAL DATA:  Code stroke follow-up EXAM: CT ANGIOGRAPHY HEAD AND NECK CT PERFUSION BRAIN TECHNIQUE: Multidetector CT imaging of the head and neck was performed using the standard protocol during bolus administration of intravenous contrast. Multiplanar CT image reconstructions and MIPs were obtained to evaluate the  vascular anatomy. Carotid stenosis measurements (when applicable) are obtained utilizing NASCET criteria, using the distal internal carotid diameter as the denominator. Multiphase CT imaging of the brain was performed following IV bolus contrast injection. Subsequent parametric perfusion maps were calculated using RAPID software. CONTRAST:  100 mL Omnipaque 350 COMPARISON:  None. FINDINGS: CTA NECK FINDINGS Aortic arch: Mild calcified plaque along the arch and at the patent great vessel origins. Right carotid system: Patent. Mild plaque the carotid. There is primarily calcified plaque at the ICA origin causing nearly 70% stenosis. Left carotid system: Patent. Mild plaque along the common carotid. There is mild calcified plaque at the ICA origin without high-grade stenosis. Just beyond the origin, there is additional primarily noncalcified plaque with resulting near occlusion over a short segment with subsequent reconstitution. Vertebral arteries: Patent. Left vertebral artery is dominant. There is calcified plaque at the vertebral artery origins causing mild stenosis. Skeleton: Multilevel degenerative changes of the cervical spine. Other neck: No mass or adenopathy. Upper chest: Included upper lungs are clear. Review of the MIP images confirms the above findings CTA HEAD FINDINGS Anterior circulation: Intracranial internal carotid arteries patent with calcified plaque. There is moderate to severe stenosis of the proximal supraclinoid portions bilaterally. Anterior cerebral arteries are patent. There is severe stenosis of the left A1 ACA. Middle cerebral arteries are patent with left M1 MCA atherosclerotic irregularity. Posterior circulation: Intracranial vertebral arteries are patent minimal calcified plaque. Basilar artery is patent with  atherosclerotic irregularity and mild to moderate stenosis. Posterior cerebral arteries are patent atherosclerotic irregularity and mild stenosis. Venous sinuses: As permitted by  contrast timing, patent. Corresponding to suspected abnormality on noncontrast CT, there remains a possible mass lesion along the left precentral gyrus. Review of the MIP images confirms the above findings CT Brain Perfusion Findings: CBF (<30%) Volume: 13mL Perfusion (Tmax>6.0s) volume: 54mL Mismatch Volume: 64mL Infarction Location: None IMPRESSION: No large vessel occlusion. Plaque at the right ICA origin causes nearly 70% stenosis. There is short segment near occlusion of the proximal left ICA with reconstitution. Intracranial atherosclerosis including moderate to severe stenoses of the proximal supraclinoid ICAs and severe stenosis of the left A1 ACA. Perfusion imaging demonstrates no evidence of core infarction. There is calculated territory at risk in the left MCA territory and left MCA/PCA watershed. Persistent possible small mass lesion along the left precentral gyrus. MRI with contrast is recommended for further evaluation. These results were called by telephone at the time of interpretation on 12/31/2019 at 2:02 pm to provider [facility] , who verbally acknowledged these results. Electronically Signed   By: Macy Mis M.D.   On: 12/31/2019 14:17   CT HEAD CODE STROKE WO CONTRAST  Addendum Date: 12/31/2019   ADDENDUM REPORT: 12/31/2019 13:50 ADDENDUM: Question of a subcentimeter peripherally hyperdense lesion along the left precentral gyrus (series 3, image 22). This can be further evaluated on upcoming CTA. Electronically Signed   By: Macy Mis M.D.   On: 12/31/2019 13:50   Result Date: 12/31/2019 CLINICAL DATA:  Code stroke. EXAM: CT HEAD WITHOUT CONTRAST TECHNIQUE: Contiguous axial images were obtained from the base of the skull through the vertex without intravenous contrast. COMPARISON:  06/25/2019 FINDINGS: Brain: There is no acute intracranial hemorrhage, mass effect, or edema. No new loss of gray-white differentiation. Prominence of the ventricles and sulci reflects stable generalized  parenchymal volume loss. Patchy and confluent areas of hypoattenuation in the supratentorial white matter are nonspecific but probably reflect stable advanced chronic microvascular ischemic changes. Vascular: No hyperdense vessel. There is intracranial atherosclerotic calcification at skull base. Skull: Unremarkable. Sinuses/Orbits: Patchy mucosal thickening. Bilateral lens replacements. Other: Mastoid air cells are clear. ASPECTS ([place] Stroke Program Early CT Score) - Ganglionic level infarction (caudate, lentiform nuclei, internal capsule, insula, M1-M3 cortex): 7 - Supraganglionic infarction (M4-M6 cortex): 3 Total score (0-10 with 10 being normal): 10 IMPRESSION: No evidence of acute infarction or intracranial hemorrhage. ASPECT score is 10. Stable chronic findings detailed above. These results were communicated to Dr. Erlinda Hong At 1:39 pmon 7/14/2021by text page via the [facility] messaging system. Electronically Signed: By: Macy Mis M.D. On: 12/31/2019 13:40    Assessment: 86 y.o. male with PMH of alzheimer's dementia, hypothyroidism, remote AFib on ASA, well-controlled T2DM, and HTN presenting to ED for aphasia and altered mental status.  Last seen well 12 noon.  NIH score 17 largely due to aphasia, not following commands.  Otherwise, no focal deficit seen.  CT no acute abnormality.  However, CTA head and neck showed left ICA high-grade stenosis.  CT perfusion showed scattered left MCA perfusion deficit.  Concerning for left MCA stroke in the setting of left ICA high-grade stenosis.  Discussed with patient daughter-in-law about the risk and benefit of TPA, she agreed to proceed.  While on mixing TPA, received a phone call from Dr. Posey Pronto radiology that patient may have small brain mass at left frontotemporal region.  TPA was canceled given contraindication of brain tumor.  Updated daughter-in-law about the development.  She expressed understanding.  Patient will need to be admitted for stroke work-up including  MRI with and without contrast.  Patient left ICA high-grade stenosis may need consult vascular surgery however doubt any surgery will be appropriate for this patient.  Stroke Risk Factors - remote A. fib not on AC, left ICA high-grade stenosis, diabetes, hypertension, intracranial atherosclerosis.  Plan: - HgbA1c, fasting lipid panel - MRI of the brain with and without contrast - IV hydration given possible AKI - check UA for reported UTI - PT consult, OT consult, Speech consult - Echocardiogram - Prophylactic therapy - ASA PR - Risk factor modification - Telemetry monitoring - Frequent neuro checks  Thank you for this consultation and allowing [place] to participate in the care of this patient.  Rosalin Hawking, MD PhD Stroke Neurology 12/31/2019 3:13 PM

## 2019-12-31 NOTE — ED Notes (Signed)
Patient transported to X-ray 

## 2019-12-31 NOTE — H&P (Signed)
History and Physical    Robert Salinas. JGG:[id number] DOB: 05/05/1927 DOA: 12/31/2019  PCP: Patient, No Pcp Per (Confirm with patient/family/NH records and if not entered, this has to be entered at [facility] point of entry) Patient coming from: [place]  I have personally briefly reviewed patient's old medical records in [place]  Chief Complaint: AMS  HPI: Robert Salinas. is a 86 y.o. male with medical history significant of Alzheimer's dementia, hypothyroidism, paroxysmal AFib on ASA, IIDM, and HTN presenting to ED for aphasia and altered mental status.  Try to call [facility] x2 with no body picking up.  Went to patient daughter-in-law, patient has baseline dementia but talking in full sentences and responding.  Patient was treated for UTI last 3 days.  Around lunchtime today, patient suddenly became nonverbal and unresponsive.  EMS arrived found patient vital signs stable no hypoxia blood pressure within normal limits glucose 413. ED Course: Glucose 307, CT head initially negative for acute finding, CT angiogram and perfusion study showed right RCA 70% stenosis and left A1 branch of ACA severe stenosis with territorial perfusion defect.  TPA was proposed, however radiology called back reporting new finding of possible left frontal temporal lobe mass, TPA was canceled.  Review of Systems: Unable to perform, patient confused  Past Medical History:  Diagnosis Date   Alzheimer disease ([place])    Atrial fibrillation ([place])    BPH (benign prostatic hyperplasia)    Chickenpox    Chronic kidney disease    COLONIC POLYPS, HX OF 06/09/2009   Dementia ([place])    Diabetes mellitus    Hx of colonic polyps    Hyperlipidemia    Hypertension    Hypothyroidism    Hypothyroidism    Thrombocytopenia ([place])     Past Surgical History:  Procedure Laterality Date   APPENDECTOMY     PROSTATECTOMY     BPH cause?     reports that he has never smoked. He has never  used smokeless tobacco. He reports that he does not drink alcohol and does not use drugs.  No Known Allergies  History reviewed. No pertinent family history.   Prior to Admission medications   Medication Sig Start Date End Date Taking? Authorizing Provider  albuterol (PROAIR HFA) 108 (90 Base) MCG/ACT inhaler Inhale 2 puffs into the lungs 4 (four) times daily as needed for wheezing or shortness of breath.   Yes [provider]  amLODipine (NORVASC) 2.5 MG tablet Take 1 tablet (2.5 mg total) by mouth daily. Patient taking differently: Take 2.5 mg by mouth at bedtime.  03/15/17  Yes [name], MD  ammonium lactate (LAC-HYDRIN) 12 % lotion Apply 1 application topically See admin instructions. Apply to both feet at bedtime 07/19/19  Yes [provider]  cephALEXin (KEFLEX) 500 MG capsule Take 500 mg by mouth every 12 (twelve) hours. 12/25/19 01/02/20 Yes [provider]  Cholecalciferol (VITAMIN D3) 50 MCG (2000 UT) TABS Take 2,000 Units by mouth daily.   Yes [provider]  dextromethorphan-guaiFENesin (ROBAFEN DM CGH/CHEST CONGEST) 10-100 MG/5ML liquid Take 5 mLs by mouth 3 (three) times daily as needed for cough.   Yes [provider]  diclofenac Sodium (VOLTAREN) 1 % GEL Apply 2 g topically 3 (three) times daily as needed (for pain- RIGHT ELBOW).   Yes [provider]  Ipratropium-Albuterol (COMBIVENT RESPIMAT) 20-100 MCG/ACT AERS respimat Inhale 1 puff into the lungs in the morning and at bedtime.   Yes [provider]  lactose free nutrition (BOOST) LIQD Take 237 mLs by mouth 2 (two) times daily between meals.   Yes [provider]  levothyroxine (SYNTHROID) 88 MCG tablet Take 88 mcg by mouth daily before breakfast.   Yes [provider]  montelukast (SINGULAIR) 10 MG tablet Take 10 mg by mouth daily.   Yes [provider]  aspirin 81 MG tablet Take 1 tablet (81 mg total) by mouth daily. Patient not  taking: Reported on 12/31/2019 03/03/16   [name], MD  atorvastatin (LIPITOR) 10 MG tablet TAKE 1 TABLET DAILY Patient not taking: Reported on 12/31/2019 08/13/15   [name], MD  donepezil (ARICEPT) 10 MG tablet TAKE 1 TABLET AT BEDTIME Patient not taking: Reported on 12/31/2019 03/13/16   [name], MD  glucose blood [facility] VERIO) test strip Use to test blood sugars daily. Dx: E11.9 10/27/15   [name], MD  Lancets [facility] ULTRASOFT) lancets Use to test blood sugars daily. Dx: E11.9 10/27/15   [name], MD  levofloxacin (LEVAQUIN) 750 MG tablet Take 1 tablet (750 mg total) by mouth daily. X 7 days Patient not taking: Reported on 12/31/2019 06/25/19   Charlesetta Shanks, MD  metFORMIN (GLUCOPHAGE) 500 MG tablet Take 0.5 tablets (250 mg total) by mouth 2 (two) times daily with a meal. Patient not taking: Reported on 12/31/2019 04/10/17   [name], MD    Physical Exam: Vitals:   12/31/19 1410 12/31/19 1411 12/31/19 1418 12/31/19 1454  BP: 140/83     Pulse: 69     Resp: 15     SpO2: 100%   100%  Weight:   84.3 kg   Height:  5\' 10"  (1.778 m) 5\' 10"  (1.778 m)     Constitutional: NAD, calm, comfortable Vitals:   12/31/19 1410 12/31/19 1411 12/31/19 1418 12/31/19 1454  BP: 140/83     Pulse: 69     Resp: 15     SpO2: 100%   100%  Weight:   84.3 kg   Height:  5\' 10"  (1.778 m) 5\' 10"  (1.778 m)    Eyes: PERRL, lids and conjunctivae normal ENMT: Mucous membranes are moist. Posterior pharynx clear of any exudate or lesions.Normal dentition.  Neck: normal, supple, no masses, no thyromegaly Respiratory: clear to auscultation bilaterally, no wheezing, no crackles. Normal respiratory effort. No accessory muscle use.  Cardiovascular: Regular rate and rhythm, no murmurs / rubs / gallops. No extremity edema. 2+ pedal pulses. No carotid bruits.  Abdomen: no tenderness, no masses palpated. No hepatosplenomegaly. Bowel sounds positive.  Musculoskeletal: no  clubbing / cyanosis. No joint deformity upper and lower extremities. Good ROM, no contractures. Normal muscle tone.  Skin: no rashes, lesions, ulcers. No induration Neurologic: Moving all limbs, not following commands Psychiatric: Calm but confused    Labs on Admission: I have personally reviewed following labs and imaging studies  CBC: Recent Labs  Lab 12/31/19 1325 12/31/19 1331  WBC 4.6  --   NEUTROABS 3.1  --   HGB 11.2* 11.2*  HCT 35.4* 33.0*  MCV 93.7  --   PLT 171  --    Basic Metabolic Panel: Recent Labs  Lab 12/31/19 1325 12/31/19 1331  NA 141 142  K 5.0 4.9  CL 108 107  CO2 23  --   GLUCOSE 307* 297*  BUN 35* 37*  CREATININE 1.69* 1.80*  CALCIUM 8.8*  --    GFR: Estimated Creatinine Clearance: 28.2 mL/min (A) (by C-G  formula based on SCr of 1.8 mg/dL (H)). Liver Function Tests: Recent Labs  Lab 12/31/19 1325  AST 24  ALT 21  ALKPHOS 98  BILITOT 0.7  PROT 6.1*  ALBUMIN 3.2*   No results for input(s): LIPASE, AMYLASE in the last 168 hours. No results for input(s): AMMONIA in the last 168 hours. Coagulation Profile: Recent Labs  Lab 12/31/19 1325  INR 1.0   Cardiac Enzymes: No results for input(s): CKTOTAL, CKMB, CKMBINDEX, TROPONINI in the last 168 hours. BNP (last 3 results) No results for input(s): PROBNP in the last 8760 hours. HbA1C: No results for input(s): HGBA1C in the last 72 hours. CBG: Recent Labs  Lab 12/31/19 1324  GLUCAP 275*   Lipid Profile: No results for input(s): CHOL, HDL, LDLCALC, TRIG, CHOLHDL, LDLDIRECT in the last 72 hours. Thyroid Function Tests: No results for input(s): TSH, T4TOTAL, FREET4, T3FREE, THYROIDAB in the last 72 hours. Anemia Panel: No results for input(s): VITAMINB12, FOLATE, FERRITIN, TIBC, IRON, RETICCTPCT in the last 72 hours. Urine analysis:    Component Value Date/Time   COLORURINE YELLOW 06/25/2019 2227   APPEARANCEUR CLOUDY (A) 06/25/2019 2227   LABSPEC 1.016 06/25/2019 2227   PHURINE  5.0 06/25/2019 2227   GLUCOSEU NEGATIVE 06/25/2019 2227   HGBUR SMALL (A) 06/25/2019 2227   BILIRUBINUR NEGATIVE 06/25/2019 2227   BILIRUBINUR Negative 04/10/2017 1620   KETONESUR NEGATIVE 06/25/2019 2227   PROTEINUR 100 (A) 06/25/2019 2227   UROBILINOGEN 0.2 04/10/2017 1620   UROBILINOGEN 1.0 12/21/2008 2206   NITRITE NEGATIVE 06/25/2019 2227   LEUKOCYTESUR LARGE (A) 06/25/2019 2227    Radiological Exams on Admission: CT ANGIO HEAD W OR WO CONTRAST  Result Date: 12/31/2019 CLINICAL DATA:  Code stroke follow-up EXAM: CT ANGIOGRAPHY HEAD AND NECK CT PERFUSION BRAIN TECHNIQUE: Multidetector CT imaging of the head and neck was performed using the standard protocol during bolus administration of intravenous contrast. Multiplanar CT image reconstructions and MIPs were obtained to evaluate the vascular anatomy. Carotid stenosis measurements (when applicable) are obtained utilizing NASCET criteria, using the distal internal carotid diameter as the denominator. Multiphase CT imaging of the brain was performed following IV bolus contrast injection. Subsequent parametric perfusion maps were calculated using RAPID software. CONTRAST:  100 mL Omnipaque 350 COMPARISON:  None. FINDINGS: CTA NECK FINDINGS Aortic arch: Mild calcified plaque along the arch and at the patent great vessel origins. Right carotid system: Patent. Mild plaque the carotid. There is primarily calcified plaque at the ICA origin causing nearly 70% stenosis. Left carotid system: Patent. Mild plaque along the common carotid. There is mild calcified plaque at the ICA origin without high-grade stenosis. Just beyond the origin, there is additional primarily noncalcified plaque with resulting near occlusion over a short segment with subsequent reconstitution. Vertebral arteries: Patent. Left vertebral artery is dominant. There is calcified plaque at the vertebral artery origins causing mild stenosis. Skeleton: Multilevel degenerative changes of the  cervical spine. Other neck: No mass or adenopathy. Upper chest: Included upper lungs are clear. Review of the MIP images confirms the above findings CTA HEAD FINDINGS Anterior circulation: Intracranial internal carotid arteries patent with calcified plaque. There is moderate to severe stenosis of the proximal supraclinoid portions bilaterally. Anterior cerebral arteries are patent. There is severe stenosis of the left A1 ACA. Middle cerebral arteries are patent with left M1 MCA atherosclerotic irregularity. Posterior circulation: Intracranial vertebral arteries are patent minimal calcified plaque. Basilar artery is patent with atherosclerotic irregularity and mild to moderate stenosis. Posterior cerebral arteries are patent atherosclerotic  irregularity and mild stenosis. Venous sinuses: As permitted by contrast timing, patent. Corresponding to suspected abnormality on noncontrast CT, there remains a possible mass lesion along the left precentral gyrus. Review of the MIP images confirms the above findings CT Brain Perfusion Findings: CBF (<30%) Volume: 43mL Perfusion (Tmax>6.0s) volume: 28mL Mismatch Volume: 19mL Infarction Location: None IMPRESSION: No large vessel occlusion. Plaque at the right ICA origin causes nearly 70% stenosis. There is short segment near occlusion of the proximal left ICA with reconstitution. Intracranial atherosclerosis including moderate to severe stenoses of the proximal supraclinoid ICAs and severe stenosis of the left A1 ACA. Perfusion imaging demonstrates no evidence of core infarction. There is calculated territory at risk in the left MCA territory and left MCA/PCA watershed. Persistent possible small mass lesion along the left precentral gyrus. MRI with contrast is recommended for further evaluation. These results were called by telephone at the time of interpretation on 12/31/2019 at 2:02 pm to provider [facility] , who verbally acknowledged these results. Electronically Signed   By:  Macy Mis M.D.   On: 12/31/2019 14:17   DG Chest 2 View  Result Date: 12/31/2019 CLINICAL DATA:  TIA, hypertension, atrial fibrillation EXAM: CHEST - 2 VIEW COMPARISON:  06/25/2019 FINDINGS: Frontal and lateral views of the chest demonstrate an unremarkable cardiac silhouette. Lung volumes are diminished, without airspace disease, effusion, or pneumothorax. No acute bony abnormalities. IMPRESSION: 1. No acute intrathoracic process. Electronically Signed   By: Randa Ngo M.D.   On: 12/31/2019 15:33   CT ANGIO NECK W OR WO CONTRAST  Result Date: 12/31/2019 CLINICAL DATA:  Code stroke follow-up EXAM: CT ANGIOGRAPHY HEAD AND NECK CT PERFUSION BRAIN TECHNIQUE: Multidetector CT imaging of the head and neck was performed using the standard protocol during bolus administration of intravenous contrast. Multiplanar CT image reconstructions and MIPs were obtained to evaluate the vascular anatomy. Carotid stenosis measurements (when applicable) are obtained utilizing NASCET criteria, using the distal internal carotid diameter as the denominator. Multiphase CT imaging of the brain was performed following IV bolus contrast injection. Subsequent parametric perfusion maps were calculated using RAPID software. CONTRAST:  100 mL Omnipaque 350 COMPARISON:  None. FINDINGS: CTA NECK FINDINGS Aortic arch: Mild calcified plaque along the arch and at the patent great vessel origins. Right carotid system: Patent. Mild plaque the carotid. There is primarily calcified plaque at the ICA origin causing nearly 70% stenosis. Left carotid system: Patent. Mild plaque along the common carotid. There is mild calcified plaque at the ICA origin without high-grade stenosis. Just beyond the origin, there is additional primarily noncalcified plaque with resulting near occlusion over a short segment with subsequent reconstitution. Vertebral arteries: Patent. Left vertebral artery is dominant. There is calcified plaque at the vertebral  artery origins causing mild stenosis. Skeleton: Multilevel degenerative changes of the cervical spine. Other neck: No mass or adenopathy. Upper chest: Included upper lungs are clear. Review of the MIP images confirms the above findings CTA HEAD FINDINGS Anterior circulation: Intracranial internal carotid arteries patent with calcified plaque. There is moderate to severe stenosis of the proximal supraclinoid portions bilaterally. Anterior cerebral arteries are patent. There is severe stenosis of the left A1 ACA. Middle cerebral arteries are patent with left M1 MCA atherosclerotic irregularity. Posterior circulation: Intracranial vertebral arteries are patent minimal calcified plaque. Basilar artery is patent with atherosclerotic irregularity and mild to moderate stenosis. Posterior cerebral arteries are patent atherosclerotic irregularity and mild stenosis. Venous sinuses: As permitted by contrast timing, patent. Corresponding to suspected abnormality on  noncontrast CT, there remains a possible mass lesion along the left precentral gyrus. Review of the MIP images confirms the above findings CT Brain Perfusion Findings: CBF (<30%) Volume: 74mL Perfusion (Tmax>6.0s) volume: 42mL Mismatch Volume: 35mL Infarction Location: None IMPRESSION: No large vessel occlusion. Plaque at the right ICA origin causes nearly 70% stenosis. There is short segment near occlusion of the proximal left ICA with reconstitution. Intracranial atherosclerosis including moderate to severe stenoses of the proximal supraclinoid ICAs and severe stenosis of the left A1 ACA. Perfusion imaging demonstrates no evidence of core infarction. There is calculated territory at risk in the left MCA territory and left MCA/PCA watershed. Persistent possible small mass lesion along the left precentral gyrus. MRI with contrast is recommended for further evaluation. These results were called by telephone at the time of interpretation on 12/31/2019 at 2:02 pm to  provider [facility] , who verbally acknowledged these results. Electronically Signed   By: Macy Mis M.D.   On: 12/31/2019 14:17   CT CEREBRAL PERFUSION W CONTRAST  Result Date: 12/31/2019 CLINICAL DATA:  Code stroke follow-up EXAM: CT ANGIOGRAPHY HEAD AND NECK CT PERFUSION BRAIN TECHNIQUE: Multidetector CT imaging of the head and neck was performed using the standard protocol during bolus administration of intravenous contrast. Multiplanar CT image reconstructions and MIPs were obtained to evaluate the vascular anatomy. Carotid stenosis measurements (when applicable) are obtained utilizing NASCET criteria, using the distal internal carotid diameter as the denominator. Multiphase CT imaging of the brain was performed following IV bolus contrast injection. Subsequent parametric perfusion maps were calculated using RAPID software. CONTRAST:  100 mL Omnipaque 350 COMPARISON:  None. FINDINGS: CTA NECK FINDINGS Aortic arch: Mild calcified plaque along the arch and at the patent great vessel origins. Right carotid system: Patent. Mild plaque the carotid. There is primarily calcified plaque at the ICA origin causing nearly 70% stenosis. Left carotid system: Patent. Mild plaque along the common carotid. There is mild calcified plaque at the ICA origin without high-grade stenosis. Just beyond the origin, there is additional primarily noncalcified plaque with resulting near occlusion over a short segment with subsequent reconstitution. Vertebral arteries: Patent. Left vertebral artery is dominant. There is calcified plaque at the vertebral artery origins causing mild stenosis. Skeleton: Multilevel degenerative changes of the cervical spine. Other neck: No mass or adenopathy. Upper chest: Included upper lungs are clear. Review of the MIP images confirms the above findings CTA HEAD FINDINGS Anterior circulation: Intracranial internal carotid arteries patent with calcified plaque. There is moderate to severe stenosis of  the proximal supraclinoid portions bilaterally. Anterior cerebral arteries are patent. There is severe stenosis of the left A1 ACA. Middle cerebral arteries are patent with left M1 MCA atherosclerotic irregularity. Posterior circulation: Intracranial vertebral arteries are patent minimal calcified plaque. Basilar artery is patent with atherosclerotic irregularity and mild to moderate stenosis. Posterior cerebral arteries are patent atherosclerotic irregularity and mild stenosis. Venous sinuses: As permitted by contrast timing, patent. Corresponding to suspected abnormality on noncontrast CT, there remains a possible mass lesion along the left precentral gyrus. Review of the MIP images confirms the above findings CT Brain Perfusion Findings: CBF (<30%) Volume: 78mL Perfusion (Tmax>6.0s) volume: 58mL Mismatch Volume: 27mL Infarction Location: None IMPRESSION: No large vessel occlusion. Plaque at the right ICA origin causes nearly 70% stenosis. There is short segment near occlusion of the proximal left ICA with reconstitution. Intracranial atherosclerosis including moderate to severe stenoses of the proximal supraclinoid ICAs and severe stenosis of the left A1 ACA. Perfusion imaging demonstrates  no evidence of core infarction. There is calculated territory at risk in the left MCA territory and left MCA/PCA watershed. Persistent possible small mass lesion along the left precentral gyrus. MRI with contrast is recommended for further evaluation. These results were called by telephone at the time of interpretation on 12/31/2019 at 2:02 pm to provider [facility] , who verbally acknowledged these results. Electronically Signed   By: Macy Mis M.D.   On: 12/31/2019 14:17   ECHOCARDIOGRAM COMPLETE  Result Date: 12/31/2019    ECHOCARDIOGRAM REPORT   Patient Name:   Oskar Cretella. Date of Exam: 12/31/2019 Medical Rec #:  [id number]            Height:       70.0 in Accession #:    [id number]           Weight:        185.8 lb Date of Birth:  05-19-1927             BSA:          2.023 m Patient Age:    28 years             BP:           140/83 mmHg Patient Gender: M                    HR:           69 bpm. Exam Location:  Inpatient Procedure: 2D Echo Indications:    TIA  History:        Patient has no prior history of Echocardiogram examinations.                 Arrythmias:Atrial Fibrillation; Risk Factors:Diabetes,                 Hypertension and Dyslipidemia.  Sonographer:    Jannett Celestine RDCS (AE) Referring Phys: [id number] Lequita Halt  Sonographer Comments: restricted mobility. IMPRESSIONS  1. Left ventricular ejection fraction, by estimation, is 60 to 65%. The left ventricle has normal function. The left ventricle has no regional wall motion abnormalities. There is moderate concentric left ventricular hypertrophy. Left ventricular diastolic parameters are consistent with Grade I diastolic dysfunction (impaired relaxation). Elevated left ventricular end-diastolic pressure.  2. Right ventricular systolic function is normal. The right ventricular size is normal.  3. The mitral valve is normal in structure. No evidence of mitral valve regurgitation. No evidence of mitral stenosis.  4. The aortic valve is tricuspid. Aortic valve regurgitation is not visualized. No aortic stenosis is present.  5. Aortic dilatation noted. There is moderate dilatation at the level of the sinuses of Valsalva measuring 46 mm.  6. The inferior vena cava is normal in size with greater than 50% respiratory variability, suggesting right atrial pressure of 3 mmHg. FINDINGS  Left Ventricle: Left ventricular ejection fraction, by estimation, is 60 to 65%. The left ventricle has normal function. The left ventricle has no regional wall motion abnormalities. The left ventricular internal cavity size was normal in size. There is  moderate concentric left ventricular hypertrophy. Left ventricular diastolic parameters are consistent with Grade I diastolic  dysfunction (impaired relaxation). Elevated left ventricular end-diastolic pressure. Right Ventricle: The right ventricular size is normal. No increase in right ventricular wall thickness. Right ventricular systolic function is normal. Left Atrium: Left atrial size was normal in size. Right Atrium: Right atrial size was normal in size. Pericardium: There is no evidence of pericardial  effusion. Mitral Valve: The mitral valve is normal in structure. Normal mobility of the mitral valve leaflets. No evidence of mitral valve regurgitation. No evidence of mitral valve stenosis. Tricuspid Valve: The tricuspid valve is normal in structure. Tricuspid valve regurgitation is not demonstrated. No evidence of tricuspid stenosis. Aortic Valve: The aortic valve is tricuspid. Aortic valve regurgitation is not visualized. No aortic stenosis is present. Pulmonic Valve: The pulmonic valve was normal in structure. Pulmonic valve regurgitation is not visualized. No evidence of pulmonic stenosis. Aorta: Aortic dilatation noted. There is moderate dilatation at the level of the sinuses of Valsalva measuring 46 mm. Venous: The inferior vena cava is normal in size with greater than 50% respiratory variability, suggesting right atrial pressure of 3 mmHg. IAS/Shunts: No atrial level shunt detected by color flow Doppler.  LEFT VENTRICLE PLAX 2D LVIDd:         2.80 cm  Diastology LVIDs:         2.30 cm  LV e' lateral:   5.33 cm/s LV PW:         1.40 cm  LV E/e' lateral: 17.2 LV IVS:        1.40 cm  LV e' medial:    4.62 cm/s LVOT diam:     2.40 cm  LV E/e' medial:  19.9 LV SV:         72 LV SV Index:   36 LVOT Area:     4.52 cm  LEFT ATRIUM           Index LA diam:      3.80 cm 1.88 cm/m LA Vol (A2C): 39.2 ml 19.38 ml/m  AORTIC VALVE LVOT Vmax:   70.80 cm/s LVOT Vmean:  55.500 cm/s LVOT VTI:    0.159 m  AORTA Ao Root diam: 4.10 cm MITRAL VALVE MV Area (PHT): 2.20 cm     SHUNTS MV Decel Time: 345 msec     Systemic VTI:  0.16 m MV E velocity:  91.90 cm/s   Systemic Diam: 2.40 cm MV A velocity: 115.00 cm/s MV E/A ratio:  0.80 Skeet Latch MD Electronically signed by Skeet Latch MD Signature Date/Time: 12/31/2019/5:08:30 PM    Final    CT HEAD CODE STROKE WO CONTRAST  Addendum Date: 12/31/2019   ADDENDUM REPORT: 12/31/2019 13:50 ADDENDUM: Question of a subcentimeter peripherally hyperdense lesion along the left precentral gyrus (series 3, image 22). This can be further evaluated on upcoming CTA. Electronically Signed   By: Macy Mis M.D.   On: 12/31/2019 13:50   Result Date: 12/31/2019 CLINICAL DATA:  Code stroke. EXAM: CT HEAD WITHOUT CONTRAST TECHNIQUE: Contiguous axial images were obtained from the base of the skull through the vertex without intravenous contrast. COMPARISON:  06/25/2019 FINDINGS: Brain: There is no acute intracranial hemorrhage, mass effect, or edema. No new loss of gray-white differentiation. Prominence of the ventricles and sulci reflects stable generalized parenchymal volume loss. Patchy and confluent areas of hypoattenuation in the supratentorial white matter are nonspecific but probably reflect stable advanced chronic microvascular ischemic changes. Vascular: No hyperdense vessel. There is intracranial atherosclerotic calcification at skull base. Skull: Unremarkable. Sinuses/Orbits: Patchy mucosal thickening. Bilateral lens replacements. Other: Mastoid air cells are clear. ASPECTS ([place] Stroke Program Early CT Score) - Ganglionic level infarction (caudate, lentiform nuclei, internal capsule, insula, M1-M3 cortex): 7 - Supraganglionic infarction (M4-M6 cortex): 3 Total score (0-10 with 10 being normal): 10 IMPRESSION: No evidence of acute infarction or intracranial hemorrhage. ASPECT score is 10. Stable chronic findings detailed  above. These results were communicated to Dr. Erlinda Hong At 1:39 pmon 7/14/2021by text page via the [facility] messaging system. Electronically Signed: By: Macy Mis M.D. On: 12/31/2019 13:40      EKG: Pending  Assessment/Plan Active Problems:   Cerebral embolism with cerebral infarction   TIA (transient ischemic attack)  (please populate well all problems here in Problem List. (For example, if patient is on BP meds at home and you resume or decide to hold them, it is a problem that needs to be her. Same for CAD, COPD, HLD and so on)  AMS -According to patient daughter-in-law, patient was treated for UTI within the week.  Review patient UTI and urine culture records, patient had at least 4 episodes of UTI in the last 2 years pansensitive Enterococcus.  Looks like patient received 3 days of Keflex to treat the episode UTI, should be sufficient. UA pending. -Will monitor pt off further ABX for now. -His sugar is elevated but the level unlikely affect mental status significantly. -Neurology on board, further working up for the intracranial mass.  Although his TIA and brain perfusion showed signs of left ACA distribution perfusion impairment and right ICA stenosis however given the patient no focal neuro deficit other than mentation changes, doubt these finding can significantly affect patient conscious level.  Question of intracranial mass -As above  CKD stage III -Creatinine level stable, clinically looks like euvolemic to borderline hypovolemia -Start IV fluids for kidney protection from IV contrasts  Dysphagia -ED nurse report patient failed bedside swallow evaluation, will follow up with formal speech evaluation.  N.p.o. for now -Start IV Foley  Paroxysmal A. Fib -Rate controlled, EKG pending, continue aspirin  IIDM with hyperglycemia -Hold Metformin, start sliding scale  Hypothyroidism -Check TSH, continue Synthroid  Alzheimer's dementia -Continue Aricept  DVT prophylaxis: Lovenox Code Status: DNR Family Communication: Daughter-in-law Disposition Plan: Patient has complicated medical issues with questionable intracranial mass and acute mentation changes, likely  will need more than 2 midnight hospital stay Consults called: Neurology Admission status: Tele admission   Lequita Halt MD [facility] Pager [phone]    12/31/2019, 5:38 PM

## 2019-12-31 NOTE — Progress Notes (Signed)
°  Echocardiogram 2D Echocardiogram has been performed.  Robert Salinas 12/31/2019, 3:45 PM

## 2019-12-31 NOTE — ED Provider Notes (Signed)
Emergency Department Provider Note   I have reviewed the triage vital signs and the nursing notes.   HISTORY  Chief Complaint Code Stroke   HPI Robert Salinas. is a 86 y.o. male with PMH reviewed below including dementia presents to the ED by EMS as a CODE STROKE. Patient was last seen at lunch today and was normal. Staff came back to find him slumped over and altered. EMS noted normal vital and hyperglycemia. CODE STROKE activated.   Level 5 caveat: Dementia and stroke symptoms.   Past Medical History:  Diagnosis Date  . Alzheimer disease ([place])   . Atrial fibrillation ([place])   . BPH (benign prostatic hyperplasia)   . Chickenpox   . Chronic kidney disease   . COLONIC POLYPS, HX OF 06/09/2009  . Dementia ([place])   . Diabetes mellitus   . Hx of colonic polyps   . Hyperlipidemia   . Hypertension   . Hypothyroidism   . Hypothyroidism   . Thrombocytopenia [facility])     Patient Active Problem List   Diagnosis Date Noted  . Cerebral embolism with cerebral infarction 12/31/2019  . TIA (transient ischemic attack) 12/31/2019  . Transient alteration of awareness   . UTI (urinary tract infection) 03/20/2017  . DNR (do not resuscitate) 12/08/2013  . Hypothyroid 10/09/2011  . CKD (chronic kidney disease), stage III 02/21/2011  . Diabetes mellitus type II, controlled ([place]) 06/09/2009  . Hyperlipidemia 06/09/2009  . ALZHEIMER'S DISEASE, EARLY 06/09/2009  . Essential hypertension 06/09/2009  . Atrial fibrillation ([place]) 06/09/2009    Past Surgical History:  Procedure Laterality Date  . APPENDECTOMY    . PROSTATECTOMY     BPH cause?    Allergies Patient has no known allergies.  History reviewed. No pertinent family history.  Social History Social History   Tobacco Use  . Smoking status: Never Smoker  . Smokeless tobacco: Never Used  Substance Use Topics  . Alcohol use: No  . Drug use: No    Review of Systems  Level 5 caveat: Stroke symptoms.    ____________________________________________   PHYSICAL EXAM:  VITAL SIGNS: Temp: 97.2 F Pulse: 69 Resp: 13 BP: 140/74   Constitutional: Somnolent. Arouses to sternal rub but appears dysarthric.  Eyes: Conjunctivae are normal. Pupil abnormality on the right (prior procedure).  Head: Atraumatic. Nose: No congestion/rhinnorhea. Mouth/Throat: Mucous membranes are moist.  Neck: No stridor.  Cardiovascular: Normal rate, regular rhythm. Good peripheral circulation. Grossly normal heart sounds.   Respiratory: Normal respiratory effort.  No retractions. Lungs CTAB. Gastrointestinal: Soft and nontender. No distention.  Musculoskeletal: No lower extremity tenderness nor edema. No gross deformities of extremities. Neurologic:  Moving all extremities but not following commands.  Skin:  Skin is warm, dry and intact. No rash noted.  ____________________________________________   LABS (all labs ordered are listed, but only abnormal results are displayed)  Labs Reviewed  CBC - Abnormal; Notable for the following components:      Result Value   RBC 3.78 (*)    Hemoglobin 11.2 (*)    HCT 35.4 (*)    All other components within normal limits  COMPREHENSIVE METABOLIC PANEL - Abnormal; Notable for the following components:   Glucose, Bld 307 (*)    BUN 35 (*)    Creatinine, Ser 1.69 (*)    Calcium 8.8 (*)    Total Protein 6.1 (*)    Albumin 3.2 (*)    GFR calc non Af Amer 35 (*)    GFR calc Af  Amer 41 (*)    All other components within normal limits  URINALYSIS, COMPLETE (UACMP) WITH MICROSCOPIC - Abnormal; Notable for the following components:   Glucose, UA 150 (*)    Protein, ur 30 (*)    All other components within normal limits  BASIC METABOLIC PANEL - Abnormal; Notable for the following components:   Glucose, Bld 121 (*)    BUN 28 (*)    Creatinine, Ser 1.56 (*)    Calcium 8.7 (*)    GFR calc non Af Amer 39 (*)    GFR calc Af Amer 45 (*)    All other components within  normal limits  HEMOGLOBIN A1C - Abnormal; Notable for the following components:   Hgb A1c MFr Bld 7.8 (*)    All other components within normal limits  LIPID PANEL - Abnormal; Notable for the following components:   LDL Cholesterol 122 (*)    All other components within normal limits  GLUCOSE, CAPILLARY - Abnormal; Notable for the following components:   Glucose-Capillary 137 (*)    All other components within normal limits  GLUCOSE, CAPILLARY - Abnormal; Notable for the following components:   Glucose-Capillary 123 (*)    All other components within normal limits  COMPREHENSIVE METABOLIC PANEL - Abnormal; Notable for the following components:   Glucose, Bld 243 (*)    BUN 29 (*)    Creatinine, Ser 1.67 (*)    Total Protein 6.2 (*)    Albumin 3.2 (*)    GFR calc non Af Amer 35 (*)    GFR calc Af Amer 41 (*)    All other components within normal limits  CBC - Abnormal; Notable for the following components:   RBC 4.07 (*)    Hemoglobin 12.1 (*)    HCT 38.0 (*)    All other components within normal limits  GLUCOSE, CAPILLARY - Abnormal; Notable for the following components:   Glucose-Capillary 214 (*)    All other components within normal limits  GLUCOSE, CAPILLARY - Abnormal; Notable for the following components:   Glucose-Capillary 335 (*)    All other components within normal limits  I-STAT CHEM 8, ED - Abnormal; Notable for the following components:   BUN 37 (*)    Creatinine, Ser 1.80 (*)    Glucose, Bld 297 (*)    Calcium, Ion 1.11 (*)    Hemoglobin 11.2 (*)    HCT 33.0 (*)    All other components within normal limits  CBG MONITORING, ED - Abnormal; Notable for the following components:   Glucose-Capillary 275 (*)    All other components within normal limits  CBG MONITORING, ED - Abnormal; Notable for the following components:   Glucose-Capillary 195 (*)    All other components within normal limits  CBG MONITORING, ED - Abnormal; Notable for the following components:    Glucose-Capillary 113 (*)    All other components within normal limits  CBG MONITORING, ED - Abnormal; Notable for the following components:   Glucose-Capillary 119 (*)    All other components within normal limits  CBG MONITORING, ED - Abnormal; Notable for the following components:   Glucose-Capillary 120 (*)    All other components within normal limits  CBG MONITORING, ED - Abnormal; Notable for the following components:   Glucose-Capillary 104 (*)    All other components within normal limits  SARS CORONAVIRUS 2 BY RT PCR (HOSPITAL ORDER, [place] [facility])  MRSA PCR SCREENING  PROTIME-INR  APTT  DIFFERENTIAL  GLUCOSE, CAPILLARY  GLUCOSE, CAPILLARY  GLUCOSE, CAPILLARY  GLUCOSE, CAPILLARY  GLUCOSE, CAPILLARY   ____________________________________________  EKG  None ____________________________________________  RADIOLOGY  CT head and CTA head and neck reviewed.  ____________________________________________   PROCEDURES  Procedure(s) performed:   Procedures  CRITICAL CARE Performed by: Margette Fast Total critical care time: 35 minutes Critical care time was exclusive of separately billable procedures and treating other patients. Critical care was necessary to treat or prevent imminent or life-threatening deterioration. Critical care was time spent personally by me on the following activities: development of treatment plan with patient and/or surrogate as well as nursing, discussions with consultants, evaluation of patient's response to treatment, examination of patient, obtaining history from patient or surrogate, ordering and performing treatments and interventions, ordering and review of laboratory studies, ordering and review of radiographic studies, pulse oximetry and re-evaluation of patient's condition.  Nanda Quinton, MD Emergency Medicine  ____________________________________________   INITIAL IMPRESSION / ASSESSMENT AND PLAN / ED  COURSE  Pertinent labs & imaging results that were available during my care of the patient were reviewed by me and considered in my medical decision making (see chart for details).   Patient arrives to the ED as a CODE STROKE. CTA and CT head reviewed. Discussed case with Neuro. No tPA with question of new brain mass. Carotid stenosis noted. Patient with encephalopathy in the past with UTI. Plan for additional labs and [facility] admit for CVA/AMS workup.   Discussed patient's case with [facility] to request admission. Patient and family (if present) updated with plan. Care transferred to [facility] service.  I reviewed all nursing notes, vitals, pertinent old records, EKGs, labs, imaging (as available).    ____________________________________________  FINAL CLINICAL IMPRESSION(S) / ED DIAGNOSES  Final diagnoses:  Transient alteration of awareness     MEDICATIONS GIVEN DURING THIS VISIT:  Medications  0.9 %  sodium chloride infusion ( Intravenous Stopped 01/01/20 0735)  insulin aspart (novoLOG) injection 0-9 Units (7 Units Subcutaneous Given 01/03/20 1718)  atorvastatin (LIPITOR) tablet 10 mg (10 mg Oral Given 01/03/20 1025)  donepezil (ARICEPT) tablet 10 mg (10 mg Oral Given 01/02/20 2156)   stroke: mapping our early stages of recovery book ( Does not apply Not Given 01/01/20 0745)  acetaminophen (TYLENOL) tablet 650 mg (has no administration in time range)    Or  acetaminophen (TYLENOL) 160 MG/5ML solution 650 mg (has no administration in time range)    Or  acetaminophen (TYLENOL) suppository 650 mg (has no administration in time range)  senna-docusate (Senokot-S) tablet 1 tablet (has no administration in time range)  heparin injection 5,000 Units (5,000 Units Subcutaneous Given 01/03/20 1028)  aspirin suppository 300 mg ( Rectal See Alternative 01/03/20 1028)    Or  aspirin EC tablet 325 mg (325 mg Oral Given 01/03/20 1028)  lactose free nutrition (Boost) liquid 237 mL (237 mLs Oral Given 01/03/20 1449)    ipratropium-albuterol (DUONEB) 0.5-2.5 (3) MG/3ML nebulizer solution 3 mL (has no administration in time range)  ammonium lactate (LAC-HYDRIN) 12 % lotion 1 application (1 application Topical Given 01/02/20 2157)  diclofenac Sodium (VOLTAREN) 1 % topical gel 2 g (has no administration in time range)  levothyroxine (SYNTHROID) tablet 88 mcg (88 mcg Oral Given 01/03/20 0533)  haloperidol (HALDOL) tablet 1 mg (has no administration in time range)    Or  haloperidol lactate (HALDOL) injection 1 mg (has no administration in time range)  sodium chloride flush (NS) 0.9 % injection 3 mL (3 mLs Intravenous Given  12/31/19 1418)  iohexol (OMNIPAQUE) 350 MG/ML injection 100 mL (100 mLs Intravenous Contrast Given 12/31/19 1353)  sodium chloride 0.9 % bolus 500 mL (0 mLs Intravenous Stopped 12/31/19 1418)  iohexol (OMNIPAQUE) 300 MG/ML solution 60 mL (60 mLs Intravenous Contrast Given 01/03/20 0951)     NEW OUTPATIENT MEDICATIONS STARTED DURING THIS VISIT:  Current Discharge Medication List    START taking these medications   Details  acetaminophen (TYLENOL) 325 MG tablet Take 2 tablets (650 mg total) by mouth every 4 (four) hours as needed for mild pain (or temp > 37.5 C (99.5 F)).        Note:  This document was prepared using Dragon voice recognition software and may include unintentional dictation errors.  Nanda Quinton, MD, [facility] Emergency Medicine    Kacey Dysert, [facility], MD 01/03/20 2028

## 2019-12-31 NOTE — ED Triage Notes (Signed)
Per [facility] pt from [place], pt LKW at noon when staff interacted with him, at 12:45 they went to get pt for lunch and staff noted pt unresponsive.   18g LAC  18g RFA

## 2019-12-31 NOTE — Code Documentation (Signed)
Code stroke called 1308, rapid response arrived at 1311, neurologist arrived at 1311.  Patient was last seen well at 1200 by staff at his nursing home.  When they returned at 1245 he was mute and not responding.  Patient has baseline dementia but speaks random words.  Patient was taken to CT and got CTA.  CTA showed tumor.  Plan Because of the tumor the patient is not a candidate for TPA.  Dr. Vonzella Nipple neuro checks q2 for 12 hours, then every 12 hours.  Patient will be admitted to floor.  The doctor updated the family on the careplan.

## 2020-01-01 ENCOUNTER — Inpatient Hospital Stay (HOSPITAL_COMMUNITY): Payer: Medicare Other

## 2020-01-01 DIAGNOSIS — F0281 Dementia in other diseases classified elsewhere with behavioral disturbance: Secondary | ICD-10-CM

## 2020-01-01 DIAGNOSIS — G301 Alzheimer's disease with late onset: Secondary | ICD-10-CM

## 2020-01-01 DIAGNOSIS — I6522 Occlusion and stenosis of left carotid artery: Secondary | ICD-10-CM

## 2020-01-01 LAB — GLUCOSE, CAPILLARY: Glucose-Capillary: 94 mg/dL (ref 70–99)

## 2020-01-01 LAB — URINALYSIS, COMPLETE (UACMP) WITH MICROSCOPIC
Bacteria, UA: NONE SEEN
Bilirubin Urine: NEGATIVE
Glucose, UA: 150 mg/dL — AB
Hgb urine dipstick: NEGATIVE
Ketones, ur: NEGATIVE mg/dL
Leukocytes,Ua: NEGATIVE
Nitrite: NEGATIVE
Protein, ur: 30 mg/dL — AB
Specific Gravity, Urine: 1.028 (ref 1.005–1.030)
pH: 7 (ref 5.0–8.0)

## 2020-01-01 LAB — BASIC METABOLIC PANEL
Anion gap: 10 (ref 5–15)
BUN: 28 mg/dL — ABNORMAL HIGH (ref 8–23)
CO2: 24 mmol/L (ref 22–32)
Calcium: 8.7 mg/dL — ABNORMAL LOW (ref 8.9–10.3)
Chloride: 109 mmol/L (ref 98–111)
Creatinine, Ser: 1.56 mg/dL — ABNORMAL HIGH (ref 0.61–1.24)
GFR calc Af Amer: 45 mL/min — ABNORMAL LOW (ref >60)
GFR calc non Af Amer: 39 mL/min — ABNORMAL LOW (ref >60)
Glucose, Bld: 121 mg/dL — ABNORMAL HIGH (ref 70–99)
Potassium: 4.3 mmol/L (ref 3.5–5.1)
Sodium: 143 mmol/L (ref 135–145)

## 2020-01-01 LAB — CBG MONITORING, ED
Glucose-Capillary: 113 mg/dL — ABNORMAL HIGH (ref 70–99)
Glucose-Capillary: 119 mg/dL — ABNORMAL HIGH (ref 70–99)
Glucose-Capillary: 120 mg/dL — ABNORMAL HIGH (ref 70–99)
Glucose-Capillary: 104 mg/dL — ABNORMAL HIGH (ref 70–99)

## 2020-01-01 LAB — LIPID PANEL
Cholesterol: 184 mg/dL (ref 0–200)
HDL: 44 mg/dL (ref >40)
LDL Cholesterol: 122 mg/dL — ABNORMAL HIGH (ref 0–99)
Total CHOL/HDL Ratio: 4.2 RATIO
Triglycerides: 90 mg/dL (ref <150)
VLDL: 18 mg/dL (ref 0–40)

## 2020-01-01 LAB — HEMOGLOBIN A1C
Hgb A1c MFr Bld: 7.8 % — ABNORMAL HIGH (ref 4.8–5.6)
Mean Plasma Glucose: 177.16 mg/dL

## 2020-01-01 LAB — MRSA PCR SCREENING: MRSA by PCR: NEGATIVE

## 2020-01-01 MED ORDER — SODIUM CHLORIDE 0.9 % IV SOLN: INTRAVENOUS | Status: DC

## 2020-01-01 NOTE — ED Notes (Signed)
Pt not cooperative to perform NIH, pt resists anytime he is touched

## 2020-01-01 NOTE — ED Notes (Signed)
Pt incontinent of urine in brief, pt cleaned with incontinent cleanser and new brief and gown applied. Pt pulled of tele wires and pulled out IV, bleeding controlled with gauze and leads applied posteriorly.

## 2020-01-01 NOTE — ED Notes (Signed)
Ordered a hospital bed--Robert Salinas  

## 2020-01-01 NOTE — ED Notes (Signed)
Placed pt on hospital bed

## 2020-01-01 NOTE — ED Notes (Signed)
Pt will not keep tele monitor on, pulse ox on left ear

## 2020-01-01 NOTE — ED Notes (Signed)
PAGED TRIAD TO RN JESSICA--Robert Salinas

## 2020-01-01 NOTE — Progress Notes (Signed)
STROKE TEAM PROGRESS NOTE   INTERVAL HISTORY Pt lying in bed, eyes open, awake, tracking bilaterally, still has global aphasia, but able to tell me his name and "leave me alone" when hold his arms up. Other than that, he says "yeah" for everything, not following commands. Moving all extremities symmetrically. Pending MRI.   Vitals:   01/01/20 0115 01/01/20 0145 01/01/20 0215 01/01/20 0745  BP:    (!) 156/87  Pulse: 62 (!) 57  63  Resp: 14 12 13 14   Temp:    97.8 F (36.6 C)  TempSrc:    Oral  SpO2: 99% 97%  100%  Weight:      Height:       CBC:  Recent Labs  Lab 12/31/19 1325 12/31/19 1331  WBC 4.6  --   NEUTROABS 3.1  --   HGB 11.2* 11.2*  HCT 35.4* 33.0*  MCV 93.7  --   PLT 171  --    Basic Metabolic Panel:  Recent Labs  Lab 12/31/19 1325 12/31/19 1325 12/31/19 1331 01/01/20 0449  NA 141   < > 142 143  K 5.0   < > 4.9 4.3  CL 108   < > 107 109  CO2 23  --   --  24  GLUCOSE 307*   < > 297* 121*  BUN 35*   < > 37* 28*  CREATININE 1.69*   < > 1.80* 1.56*  CALCIUM 8.8*  --   --  8.7*   < > = values in this interval not displayed.   Lipid Panel:  Recent Labs  Lab 01/01/20 0449  CHOL 184  TRIG 90  HDL 44  CHOLHDL 4.2  VLDL 18  LDLCALC 122*   HgbA1c:  Recent Labs  Lab 01/01/20 0449  HGBA1C 7.8*   Urine Drug Screen: No results for input(s): LABOPIA, COCAINSCRNUR, LABBENZ, AMPHETMU, THCU, LABBARB in the last 168 hours.  Alcohol Level No results for input(s): ETH in the last 168 hours.  IMAGING past 24 hours CT ANGIO HEAD W OR WO CONTRAST  Result Date: 12/31/2019 CLINICAL DATA:  Code stroke follow-up EXAM: CT ANGIOGRAPHY HEAD AND NECK CT PERFUSION BRAIN TECHNIQUE: Multidetector CT imaging of the head and neck was performed using the standard protocol during bolus administration of intravenous contrast. Multiplanar CT image reconstructions and MIPs were obtained to evaluate the vascular anatomy. Carotid stenosis measurements (when applicable) are obtained  utilizing NASCET criteria, using the distal internal carotid diameter as the denominator. Multiphase CT imaging of the brain was performed following IV bolus contrast injection. Subsequent parametric perfusion maps were calculated using RAPID software. CONTRAST:  100 mL Omnipaque 350 COMPARISON:  None. FINDINGS: CTA NECK FINDINGS Aortic arch: Mild calcified plaque along the arch and at the patent great vessel origins. Right carotid system: Patent. Mild plaque the carotid. There is primarily calcified plaque at the ICA origin causing nearly 70% stenosis. Left carotid system: Patent. Mild plaque along the common carotid. There is mild calcified plaque at the ICA origin without high-grade stenosis. Just beyond the origin, there is additional primarily noncalcified plaque with resulting near occlusion over a short segment with subsequent reconstitution. Vertebral arteries: Patent. Left vertebral artery is dominant. There is calcified plaque at the vertebral artery origins causing mild stenosis. Skeleton: Multilevel degenerative changes of the cervical spine. Other neck: No mass or adenopathy. Upper chest: Included upper lungs are clear. Review of the MIP images confirms the above findings CTA HEAD FINDINGS Anterior circulation: Intracranial internal carotid arteries  patent with calcified plaque. There is moderate to severe stenosis of the proximal supraclinoid portions bilaterally. Anterior cerebral arteries are patent. There is severe stenosis of the left A1 ACA. Middle cerebral arteries are patent with left M1 MCA atherosclerotic irregularity. Posterior circulation: Intracranial vertebral arteries are patent minimal calcified plaque. Basilar artery is patent with atherosclerotic irregularity and mild to moderate stenosis. Posterior cerebral arteries are patent atherosclerotic irregularity and mild stenosis. Venous sinuses: As permitted by contrast timing, patent. Corresponding to suspected abnormality on noncontrast  CT, there remains a possible mass lesion along the left precentral gyrus. Review of the MIP images confirms the above findings CT Brain Perfusion Findings: CBF (<30%) Volume: 64mL Perfusion (Tmax>6.0s) volume: 61mL Mismatch Volume: 30mL Infarction Location: None IMPRESSION: No large vessel occlusion. Plaque at the right ICA origin causes nearly 70% stenosis. There is short segment near occlusion of the proximal left ICA with reconstitution. Intracranial atherosclerosis including moderate to severe stenoses of the proximal supraclinoid ICAs and severe stenosis of the left A1 ACA. Perfusion imaging demonstrates no evidence of core infarction. There is calculated territory at risk in the left MCA territory and left MCA/PCA watershed. Persistent possible small mass lesion along the left precentral gyrus. MRI with contrast is recommended for further evaluation. These results were called by telephone at the time of interpretation on 12/31/2019 at 2:02 pm to provider [facility] , who verbally acknowledged these results. Electronically Signed   By: Macy Mis M.D.   On: 12/31/2019 14:17   DG Chest 2 View  Result Date: 12/31/2019 CLINICAL DATA:  TIA, hypertension, atrial fibrillation EXAM: CHEST - 2 VIEW COMPARISON:  06/25/2019 FINDINGS: Frontal and lateral views of the chest demonstrate an unremarkable cardiac silhouette. Lung volumes are diminished, without airspace disease, effusion, or pneumothorax. No acute bony abnormalities. IMPRESSION: 1. No acute intrathoracic process. Electronically Signed   By: Randa Ngo M.D.   On: 12/31/2019 15:33   CT ANGIO NECK W OR WO CONTRAST  Result Date: 12/31/2019 CLINICAL DATA:  Code stroke follow-up EXAM: CT ANGIOGRAPHY HEAD AND NECK CT PERFUSION BRAIN TECHNIQUE: Multidetector CT imaging of the head and neck was performed using the standard protocol during bolus administration of intravenous contrast. Multiplanar CT image reconstructions and MIPs were obtained to evaluate  the vascular anatomy. Carotid stenosis measurements (when applicable) are obtained utilizing NASCET criteria, using the distal internal carotid diameter as the denominator. Multiphase CT imaging of the brain was performed following IV bolus contrast injection. Subsequent parametric perfusion maps were calculated using RAPID software. CONTRAST:  100 mL Omnipaque 350 COMPARISON:  None. FINDINGS: CTA NECK FINDINGS Aortic arch: Mild calcified plaque along the arch and at the patent great vessel origins. Right carotid system: Patent. Mild plaque the carotid. There is primarily calcified plaque at the ICA origin causing nearly 70% stenosis. Left carotid system: Patent. Mild plaque along the common carotid. There is mild calcified plaque at the ICA origin without high-grade stenosis. Just beyond the origin, there is additional primarily noncalcified plaque with resulting near occlusion over a short segment with subsequent reconstitution. Vertebral arteries: Patent. Left vertebral artery is dominant. There is calcified plaque at the vertebral artery origins causing mild stenosis. Skeleton: Multilevel degenerative changes of the cervical spine. Other neck: No mass or adenopathy. Upper chest: Included upper lungs are clear. Review of the MIP images confirms the above findings CTA HEAD FINDINGS Anterior circulation: Intracranial internal carotid arteries patent with calcified plaque. There is moderate to severe stenosis of the proximal supraclinoid portions bilaterally. Anterior  cerebral arteries are patent. There is severe stenosis of the left A1 ACA. Middle cerebral arteries are patent with left M1 MCA atherosclerotic irregularity. Posterior circulation: Intracranial vertebral arteries are patent minimal calcified plaque. Basilar artery is patent with atherosclerotic irregularity and mild to moderate stenosis. Posterior cerebral arteries are patent atherosclerotic irregularity and mild stenosis. Venous sinuses: As permitted  by contrast timing, patent. Corresponding to suspected abnormality on noncontrast CT, there remains a possible mass lesion along the left precentral gyrus. Review of the MIP images confirms the above findings CT Brain Perfusion Findings: CBF (<30%) Volume: 41mL Perfusion (Tmax>6.0s) volume: 62mL Mismatch Volume: 101mL Infarction Location: None IMPRESSION: No large vessel occlusion. Plaque at the right ICA origin causes nearly 70% stenosis. There is short segment near occlusion of the proximal left ICA with reconstitution. Intracranial atherosclerosis including moderate to severe stenoses of the proximal supraclinoid ICAs and severe stenosis of the left A1 ACA. Perfusion imaging demonstrates no evidence of core infarction. There is calculated territory at risk in the left MCA territory and left MCA/PCA watershed. Persistent possible small mass lesion along the left precentral gyrus. MRI with contrast is recommended for further evaluation. These results were called by telephone at the time of interpretation on 12/31/2019 at 2:02 pm to provider [facility] , who verbally acknowledged these results. Electronically Signed   By: Macy Mis M.D.   On: 12/31/2019 14:17   CT CEREBRAL PERFUSION W CONTRAST  Result Date: 12/31/2019 CLINICAL DATA:  Code stroke follow-up EXAM: CT ANGIOGRAPHY HEAD AND NECK CT PERFUSION BRAIN TECHNIQUE: Multidetector CT imaging of the head and neck was performed using the standard protocol during bolus administration of intravenous contrast. Multiplanar CT image reconstructions and MIPs were obtained to evaluate the vascular anatomy. Carotid stenosis measurements (when applicable) are obtained utilizing NASCET criteria, using the distal internal carotid diameter as the denominator. Multiphase CT imaging of the brain was performed following IV bolus contrast injection. Subsequent parametric perfusion maps were calculated using RAPID software. CONTRAST:  100 mL Omnipaque 350 COMPARISON:  None.  FINDINGS: CTA NECK FINDINGS Aortic arch: Mild calcified plaque along the arch and at the patent great vessel origins. Right carotid system: Patent. Mild plaque the carotid. There is primarily calcified plaque at the ICA origin causing nearly 70% stenosis. Left carotid system: Patent. Mild plaque along the common carotid. There is mild calcified plaque at the ICA origin without high-grade stenosis. Just beyond the origin, there is additional primarily noncalcified plaque with resulting near occlusion over a short segment with subsequent reconstitution. Vertebral arteries: Patent. Left vertebral artery is dominant. There is calcified plaque at the vertebral artery origins causing mild stenosis. Skeleton: Multilevel degenerative changes of the cervical spine. Other neck: No mass or adenopathy. Upper chest: Included upper lungs are clear. Review of the MIP images confirms the above findings CTA HEAD FINDINGS Anterior circulation: Intracranial internal carotid arteries patent with calcified plaque. There is moderate to severe stenosis of the proximal supraclinoid portions bilaterally. Anterior cerebral arteries are patent. There is severe stenosis of the left A1 ACA. Middle cerebral arteries are patent with left M1 MCA atherosclerotic irregularity. Posterior circulation: Intracranial vertebral arteries are patent minimal calcified plaque. Basilar artery is patent with atherosclerotic irregularity and mild to moderate stenosis. Posterior cerebral arteries are patent atherosclerotic irregularity and mild stenosis. Venous sinuses: As permitted by contrast timing, patent. Corresponding to suspected abnormality on noncontrast CT, there remains a possible mass lesion along the left precentral gyrus. Review of the MIP images confirms the above findings  CT Brain Perfusion Findings: CBF (<30%) Volume: 39mL Perfusion (Tmax>6.0s) volume: 47mL Mismatch Volume: 49mL Infarction Location: None IMPRESSION: No large vessel occlusion.  Plaque at the right ICA origin causes nearly 70% stenosis. There is short segment near occlusion of the proximal left ICA with reconstitution. Intracranial atherosclerosis including moderate to severe stenoses of the proximal supraclinoid ICAs and severe stenosis of the left A1 ACA. Perfusion imaging demonstrates no evidence of core infarction. There is calculated territory at risk in the left MCA territory and left MCA/PCA watershed. Persistent possible small mass lesion along the left precentral gyrus. MRI with contrast is recommended for further evaluation. These results were called by telephone at the time of interpretation on 12/31/2019 at 2:02 pm to provider [facility] , who verbally acknowledged these results. Electronically Signed   By: Macy Mis M.D.   On: 12/31/2019 14:17   DG Abd Portable 1V  Result Date: 01/01/2020 CLINICAL DATA:  Altered mental status. Clearance for MRI EXAM: PORTABLE ABDOMEN - 1 VIEW COMPARISON:  None. FINDINGS: The bowel gas pattern is unremarkable. Vascular calcifications are noted. No significant bony findings. No metallic foreign bodies are identified. IMPRESSION: Unremarkable abdominal radiographs. Electronically Signed   By: Marijo Sanes M.D.   On: 01/01/2020 05:21   ECHOCARDIOGRAM COMPLETE  Result Date: 12/31/2019    ECHOCARDIOGRAM REPORT   Patient Name:   Saheed Carrington. Date of Exam: 12/31/2019 Medical Rec #:  [id number]            Height:       70.0 in Accession #:    [id number]           Weight:       185.8 lb Date of Birth:  1927/03/01             BSA:          2.023 m Patient Age:    5 years             BP:           140/83 mmHg Patient Gender: M                    HR:           69 bpm. Exam Location:  Inpatient Procedure: 2D Echo Indications:    TIA  History:        Patient has no prior history of Echocardiogram examinations.                 Arrythmias:Atrial Fibrillation; Risk Factors:Diabetes,                 Hypertension and Dyslipidemia.  Sonographer:     Jannett Celestine RDCS (AE) Referring Phys: [id number] Lequita Halt  Sonographer Comments: restricted mobility. IMPRESSIONS  1. Left ventricular ejection fraction, by estimation, is 60 to 65%. The left ventricle has normal function. The left ventricle has no regional wall motion abnormalities. There is moderate concentric left ventricular hypertrophy. Left ventricular diastolic parameters are consistent with Grade I diastolic dysfunction (impaired relaxation). Elevated left ventricular end-diastolic pressure.  2. Right ventricular systolic function is normal. The right ventricular size is normal.  3. The mitral valve is normal in structure. No evidence of mitral valve regurgitation. No evidence of mitral stenosis.  4. The aortic valve is tricuspid. Aortic valve regurgitation is not visualized. No aortic stenosis is present.  5. Aortic dilatation noted. There is moderate dilatation at the level of the sinuses of Valsalva measuring  46 mm.  6. The inferior vena cava is normal in size with greater than 50% respiratory variability, suggesting right atrial pressure of 3 mmHg. FINDINGS  Left Ventricle: Left ventricular ejection fraction, by estimation, is 60 to 65%. The left ventricle has normal function. The left ventricle has no regional wall motion abnormalities. The left ventricular internal cavity size was normal in size. There is  moderate concentric left ventricular hypertrophy. Left ventricular diastolic parameters are consistent with Grade I diastolic dysfunction (impaired relaxation). Elevated left ventricular end-diastolic pressure. Right Ventricle: The right ventricular size is normal. No increase in right ventricular wall thickness. Right ventricular systolic function is normal. Left Atrium: Left atrial size was normal in size. Right Atrium: Right atrial size was normal in size. Pericardium: There is no evidence of pericardial effusion. Mitral Valve: The mitral valve is normal in structure. Normal mobility of the  mitral valve leaflets. No evidence of mitral valve regurgitation. No evidence of mitral valve stenosis. Tricuspid Valve: The tricuspid valve is normal in structure. Tricuspid valve regurgitation is not demonstrated. No evidence of tricuspid stenosis. Aortic Valve: The aortic valve is tricuspid. Aortic valve regurgitation is not visualized. No aortic stenosis is present. Pulmonic Valve: The pulmonic valve was normal in structure. Pulmonic valve regurgitation is not visualized. No evidence of pulmonic stenosis. Aorta: Aortic dilatation noted. There is moderate dilatation at the level of the sinuses of Valsalva measuring 46 mm. Venous: The inferior vena cava is normal in size with greater than 50% respiratory variability, suggesting right atrial pressure of 3 mmHg. IAS/Shunts: No atrial level shunt detected by color flow Doppler.  LEFT VENTRICLE PLAX 2D LVIDd:         2.80 cm  Diastology LVIDs:         2.30 cm  LV e' lateral:   5.33 cm/s LV PW:         1.40 cm  LV E/e' lateral: 17.2 LV IVS:        1.40 cm  LV e' medial:    4.62 cm/s LVOT diam:     2.40 cm  LV E/e' medial:  19.9 LV SV:         72 LV SV Index:   36 LVOT Area:     4.52 cm  LEFT ATRIUM           Index LA diam:      3.80 cm 1.88 cm/m LA Vol (A2C): 39.2 ml 19.38 ml/m  AORTIC VALVE LVOT Vmax:   70.80 cm/s LVOT Vmean:  55.500 cm/s LVOT VTI:    0.159 m  AORTA Ao Root diam: 4.10 cm MITRAL VALVE MV Area (PHT): 2.20 cm     SHUNTS MV Decel Time: 345 msec     Systemic VTI:  0.16 m MV E velocity: 91.90 cm/s   Systemic Diam: 2.40 cm MV A velocity: 115.00 cm/s MV E/A ratio:  0.80 Skeet Latch MD Electronically signed by Skeet Latch MD Signature Date/Time: 12/31/2019/5:08:30 PM    Final    CT HEAD CODE STROKE WO CONTRAST  Addendum Date: 12/31/2019   ADDENDUM REPORT: 12/31/2019 13:50 ADDENDUM: Question of a subcentimeter peripherally hyperdense lesion along the left precentral gyrus (series 3, image 22). This can be further evaluated on upcoming CTA.  Electronically Signed   By: Macy Mis M.D.   On: 12/31/2019 13:50   Result Date: 12/31/2019 CLINICAL DATA:  Code stroke. EXAM: CT HEAD WITHOUT CONTRAST TECHNIQUE: Contiguous axial images were obtained from the base of the skull through the  vertex without intravenous contrast. COMPARISON:  06/25/2019 FINDINGS: Brain: There is no acute intracranial hemorrhage, mass effect, or edema. No new loss of gray-white differentiation. Prominence of the ventricles and sulci reflects stable generalized parenchymal volume loss. Patchy and confluent areas of hypoattenuation in the supratentorial white matter are nonspecific but probably reflect stable advanced chronic microvascular ischemic changes. Vascular: No hyperdense vessel. There is intracranial atherosclerotic calcification at skull base. Skull: Unremarkable. Sinuses/Orbits: Patchy mucosal thickening. Bilateral lens replacements. Other: Mastoid air cells are clear. ASPECTS (Anderson Stroke Program Early CT Score) - Ganglionic level infarction (caudate, lentiform nuclei, internal capsule, insula, M1-M3 cortex): 7 - Supraganglionic infarction (M4-M6 cortex): 3 Total score (0-10 with 10 being normal): 10 IMPRESSION: No evidence of acute infarction or intracranial hemorrhage. ASPECT score is 10. Stable chronic findings detailed above. These results were communicated to Dr. Erlinda Hong At 1:39 pmon 7/14/2021by text page via the [employer] messaging system. Electronically Signed: By: Macy Mis M.D. On: 12/31/2019 13:40    PHYSICAL EXAM  Temp:  [97.2 F (36.2 C)-97.8 F (36.6 C)] 97.8 F (36.6 C) (07/15 0745) Pulse Rate:  [57-69] 62 (07/15 1050) Resp:  [12-15] 13 (07/15 1050) BP: (140-163)/(72-87) 142/73 (07/15 1050) SpO2:  [97 %-100 %] 98 % (07/15 1050) FiO2 (%):  [21 %] 21 % (07/14 1454) Weight:  [84.3 kg] 84.3 kg (07/14 1418)  General - mildly cachectic, well developed, in no acute distress.    Ophthalmologic - fundi not visualized due to noncooperation.     Cardiovascular - regular rhythm and rate  Neuro - awake, eyes open, global aphasia but able to to tell me his name and "leave me alone" when I tried to hold his arms up. Other than that, he only says "yeah" to every question and not following commands. Tracking with eyes bilaterally, no gaze palsy, inconsistently blinking to visual threat bilaterally.  No significant facial droop.  Tongue protrusion not corporative.  Bilateral upper extremity at least 3/5, symmetrical.  Bilateral lower extremity at least 3-/5, symmetrical. Sensation, coordination not cooperative and gait not tested.   ASSESSMENT/PLAN [name]. is a 86 y.o. male with history of f alzheimer's dementia, hypothyroidism, remote AFib on ASA, well-controlled T2DM, and HTN presenting with aphasia and altered mental status. Plans to give tPA squelched when CT showed possible small L frontotemporal brain mass.   Stroke:   L MCA infarct in setting of L ICA high-grade stenosis (large vessel disease)  Code Stroke CT head No acute abnormality. Possible small hyperdense L precentral gyrus mass. ASPECTS 10.     CTA head & neck no LVO.  R ICA origin 70%. Proximal L ICA short near occlusion. Proximal supraclinoid ICA moderate to severe.  L A1 severe stenosis. Possible small mass L precentral gyrus.   CT perfusion no core. L MCA, L MCA/PCA watershed territory at risk  MRI w/w/o pending  2D Echo EF 60-65%. No source of embolus   LDL 122  HgbA1c 7.8  VTE prophylaxis - Heparin 5000 units sq tid   aspirin 81 mg daily prior to admission, now on aspirin 300 mg suppository daily without po access  Therapy recommendations:  pending   Disposition:  pending  (admitted from [facility])  Possible intracranial Mass  Code Stroke CT head Possible small hyperdense L precentral gyrus mass.   MRI w/w/o pending  Not tPA candidate  Paroxysmal Atrial Fibrillation  Home anticoagulation:  none   Not on AC due to fall risk   . On ASA at  [facility] . Not an AC at this time d/t acute stroke and possible intracranial mass   Hypertension  Home meds:  norvasc 2.5  Stable . Permissive hypertension (OK if < 220/120) but gradually normalize in 3-5 days . Long-term BP goal 130-150 given b/l ICA stenosis  Hyperlipidemia  Home meds:  lipitor 10   LDL 122, goal < 70  Resume statin once po access  Continue statin at discharge  Diabetes type II Uncontrolled  Home meds:  metformin  HgbA1c 7.8, goal < 7.0  Glucose 412 on admission   CBGs  SSI  Close PCP follow up for better DM control  CKD stage III  Cre 1.69-1.80-1.56  On IVF  Continue BMP monitoring  Dysphagia . Secondary to stroke . NPO . Speech on board . On IVF   Other Stroke Risk Factors  Advanced age  Other Active Problems  Alzheimer's Dementia on aricept  Hypothyroid on synthroid  Recent UTI, on abx 3 days PTA  Hospital day # 1  Rosalin Hawking, MD PhD Stroke Neurology 01/01/2020 11:49 AM   To contact Stroke Continuity provider, please refer to [email or web address] After hours, contact General Neurology

## 2020-01-01 NOTE — Progress Notes (Signed)
0550 on 01/01/2020 Pt combative and unable to change or move on to table. pt very strong 0152 on 01/01/2020 RN jess to contact hospitalist to order Xray [email or web address] 12/31/2019: RN says pt is still moving around and not A/O, but would still like to try MRI soon. Pt is sleeping at the moment. RN stated PT is pulling on cords and not laying still, Try again later @ 4:15pm.

## 2020-01-01 NOTE — ED Notes (Signed)
Pt having brief 2-3 second episodes of bradycardia, admitting MD made aware, no new orders, will continue to monitor

## 2020-01-01 NOTE — Progress Notes (Signed)
PROGRESS NOTE    Robert Salinas.  ZOX:[id number] DOB: 04/11/1927 DOA: 12/31/2019 PCP: Patient, No Pcp Per    Brief Narrative: 86 year old male admitted from nursing home with change in mental status.  She has history of Alzheimer's dementia hypothyroidism paroxysmal atrial fibrillation type 2 diabetes and hypertension admitted with aphasia.  Patient suddenly became nonverbal and unresponsive at the nursing home.  Admitted for stroke work-up.  Assessment & Plan:   Active Problems:   Cerebral embolism with cerebral infarction   TIA (transient ischemic attack)   #1 left MCA infarct-work-up shows CT angiogram of the head and neck with 70% right ICA origin proximal left ICA near occlusion severe supraclinoid ICA stenosis possible small  mass in the precentral gyrus MRI pending CT head showed no acute stroke Echo normal ejection fraction LDL 122 Hemoglobin A1c 7.8 PT OT and speech therapy consults are pending   #2 CKD stage III creatinine 1.56 down from 1.80 on admission.  Continue slow IV hydration as patient is n.p.o.  #3 dysphagia due to stroke patient is n.p.o. speech therapy consulted  #4 history of paroxysmal atrial fibrillation not on anticoagulation at the nursing home due to risk of falls.  #5 type 2 diabetes with hyperglycemia hemoglobin A1c is 7.8.  On SSI.  Was on Metformin prior to admission. CBG (last 3)  Recent Labs    01/01/20 0114 01/01/20 0749 01/01/20 1203  GLUCAP 113* 119* 120*     #6 hypothyroidism on Synthroid  #7 history of Alzheimer's dementia on Aricept  #8 history of essential hypertension blood pressure running soft prior to admission he was on Norvasc which is on hold at this time.    Estimated body mass index is 26.67 kg/m as calculated from the following:   Height as of this encounter: 5\' 10"  (1.778 m).   Weight as of this encounter: 84.3 kg.  DVT prophylaxis: Heparin subcu twice daily Code Status: DNR Family Communication: I  called all the 4 numbers available with no response. Disposition Plan:  Status is: Inpatient   Dispo: The patient is from: SNF              Anticipated d/c is to: SNF              Anticipated d/c date is: 2 days              Patient currently is not medically stable to d/c.    Consultants:   Neurology  Procedures: None Antimicrobials: None Subjective: Patient resting in bed he is confused moves all extremities does not follow any commands  Objective: Vitals:   01/01/20 0745 01/01/20 1026 01/01/20 1050 01/01/20 1200  BP: (!) 156/87  (!) 142/73 137/83  Pulse: 63 61 62 60  Resp: 14  13 13   Temp: 97.8 F (36.6 C)     TempSrc: Oral     SpO2: 100% 100% 98% 99%  Weight:      Height:        Intake/Output Summary (Last 24 hours) at 01/01/2020 1329 Last data filed at 12/31/2019 1418 Gross per 24 hour  Intake 500 ml  Output --  Net 500 ml   Filed Weights   12/31/19 1418  Weight: 84.3 kg    Examination:  General exam: Appears calm and comfortable  Respiratory system: Clear to auscultation. Respiratory effort normal. Cardiovascular system: S1 & S2 heard, RRR. No JVD, murmurs, rubs, gallops or clicks. No pedal edema. Gastrointestinal system: Abdomen is nondistended, soft and nontender.  No organomegaly or masses felt. Normal bowel sounds heard. Central nervous system: Confused does not answer any questions appropriately or follow any commands  extremities no edema Skin: No rashes, lesions or ulcers Psychiatry: Unable to assess.   Data Reviewed: I have personally reviewed following labs and imaging studies  CBC: Recent Labs  Lab 12/31/19 1325 12/31/19 1331  WBC 4.6  --   NEUTROABS 3.1  --   HGB 11.2* 11.2*  HCT 35.4* 33.0*  MCV 93.7  --   PLT 171  --    Basic Metabolic Panel: Recent Labs  Lab 12/31/19 1325 12/31/19 1331 01/01/20 0449  NA 141 142 143  K 5.0 4.9 4.3  CL 108 107 109  CO2 23  --  24  GLUCOSE 307* 297* 121*  BUN 35* 37* 28*  CREATININE 1.69*  1.80* 1.56*  CALCIUM 8.8*  --  8.7*   GFR: Estimated Creatinine Clearance: 32.5 mL/min (A) (by C-G formula based on SCr of 1.56 mg/dL (H)). Liver Function Tests: Recent Labs  Lab 12/31/19 1325  AST 24  ALT 21  ALKPHOS 98  BILITOT 0.7  PROT 6.1*  ALBUMIN 3.2*   No results for input(s): LIPASE, AMYLASE in the last 168 hours. No results for input(s): AMMONIA in the last 168 hours. Coagulation Profile: Recent Labs  Lab 12/31/19 1325  INR 1.0   Cardiac Enzymes: No results for input(s): CKTOTAL, CKMB, CKMBINDEX, TROPONINI in the last 168 hours. BNP (last 3 results) No results for input(s): PROBNP in the last 8760 hours. HbA1C: Recent Labs    01/01/20 0449  HGBA1C 7.8*   CBG: Recent Labs  Lab 12/31/19 1324 12/31/19 1853 01/01/20 0114 01/01/20 0749 01/01/20 1203  GLUCAP 275* 195* 113* 119* 120*   Lipid Profile: Recent Labs    01/01/20 0449  CHOL 184  HDL 44  LDLCALC 122*  TRIG 90  CHOLHDL 4.2   Thyroid Function Tests: No results for input(s): TSH, T4TOTAL, FREET4, T3FREE, THYROIDAB in the last 72 hours. Anemia Panel: No results for input(s): VITAMINB12, FOLATE, FERRITIN, TIBC, IRON, RETICCTPCT in the last 72 hours. Sepsis Labs: No results for input(s): PROCALCITON, LATICACIDVEN in the last 168 hours.  Recent Results (from the past 240 hour(s))  SARS Coronavirus 2 by RT PCR (hospital order, performed in [facility] hospital lab) Nasopharyngeal Nasopharyngeal Swab     Status: None   Collection Time: 12/31/19  2:29 PM   Specimen: Nasopharyngeal Swab  Result Value Ref Range Status   SARS Coronavirus 2 NEGATIVE NEGATIVE Final    Comment: (NOTE) SARS-CoV-2 target nucleic acids are NOT DETECTED.  The SARS-CoV-2 RNA is generally detectable in upper and lower respiratory specimens during the acute phase of infection. The lowest concentration of SARS-CoV-2 viral copies this assay can detect is 250 copies / mL. A negative result does not preclude SARS-CoV-2  infection and should not be used as the sole basis for treatment or other patient management decisions.  A negative result may occur with improper specimen collection / handling, submission of specimen other than nasopharyngeal swab, presence of viral mutation(s) within the areas targeted by this assay, and inadequate number of viral copies (<250 copies / mL). A negative result must be combined with clinical observations, patient history, and epidemiological information.  Fact Sheet for Patients:   StrictlyIdeas.no  Fact Sheet for Healthcare Providers: BankingDealers.co.za  This test is not yet approved or  cleared by the [place] FDA and has been authorized for detection and/or diagnosis of SARS-CoV-2 by FDA  under an Emergency Use Authorization (EUA).  This EUA will remain in effect (meaning this test can be used) for the duration of the COVID-19 declaration under Section 564(b)(1) of the Act, 21 U.S.C. section 360bbb-3(b)(1), unless the authorization is terminated or revoked sooner.  Performed at [facility], Seth Ward [address], [place], [place] [address]          Radiology Studies: CT ANGIO HEAD W OR WO CONTRAST  Result Date: 12/31/2019 CLINICAL DATA:  Code stroke follow-up EXAM: CT ANGIOGRAPHY HEAD AND NECK CT PERFUSION BRAIN TECHNIQUE: Multidetector CT imaging of the head and neck was performed using the standard protocol during bolus administration of intravenous contrast. Multiplanar CT image reconstructions and MIPs were obtained to evaluate the vascular anatomy. Carotid stenosis measurements (when applicable) are obtained utilizing NASCET criteria, using the distal internal carotid diameter as the denominator. Multiphase CT imaging of the brain was performed following IV bolus contrast injection. Subsequent parametric perfusion maps were calculated using RAPID software. CONTRAST:  100 mL Omnipaque 350 COMPARISON:  None.  FINDINGS: CTA NECK FINDINGS Aortic arch: Mild calcified plaque along the arch and at the patent great vessel origins. Right carotid system: Patent. Mild plaque the carotid. There is primarily calcified plaque at the ICA origin causing nearly 70% stenosis. Left carotid system: Patent. Mild plaque along the common carotid. There is mild calcified plaque at the ICA origin without high-grade stenosis. Just beyond the origin, there is additional primarily noncalcified plaque with resulting near occlusion over a short segment with subsequent reconstitution. Vertebral arteries: Patent. Left vertebral artery is dominant. There is calcified plaque at the vertebral artery origins causing mild stenosis. Skeleton: Multilevel degenerative changes of the cervical spine. Other neck: No mass or adenopathy. Upper chest: Included upper lungs are clear. Review of the MIP images confirms the above findings CTA HEAD FINDINGS Anterior circulation: Intracranial internal carotid arteries patent with calcified plaque. There is moderate to severe stenosis of the proximal supraclinoid portions bilaterally. Anterior cerebral arteries are patent. There is severe stenosis of the left A1 ACA. Middle cerebral arteries are patent with left M1 MCA atherosclerotic irregularity. Posterior circulation: Intracranial vertebral arteries are patent minimal calcified plaque. Basilar artery is patent with atherosclerotic irregularity and mild to moderate stenosis. Posterior cerebral arteries are patent atherosclerotic irregularity and mild stenosis. Venous sinuses: As permitted by contrast timing, patent. Corresponding to suspected abnormality on noncontrast CT, there remains a possible mass lesion along the left precentral gyrus. Review of the MIP images confirms the above findings CT Brain Perfusion Findings: CBF (<30%) Volume: 11mL Perfusion (Tmax>6.0s) volume: 30mL Mismatch Volume: 24mL Infarction Location: None IMPRESSION: No large vessel occlusion.  Plaque at the right ICA origin causes nearly 70% stenosis. There is short segment near occlusion of the proximal left ICA with reconstitution. Intracranial atherosclerosis including moderate to severe stenoses of the proximal supraclinoid ICAs and severe stenosis of the left A1 ACA. Perfusion imaging demonstrates no evidence of core infarction. There is calculated territory at risk in the left MCA territory and left MCA/PCA watershed. Persistent possible small mass lesion along the left precentral gyrus. MRI with contrast is recommended for further evaluation. These results were called by telephone at the time of interpretation on 12/31/2019 at 2:02 pm to provider [facility] , who verbally acknowledged these results. Electronically Signed   By: Macy Mis M.D.   On: 12/31/2019 14:17   DG Chest 2 View  Result Date: 12/31/2019 CLINICAL DATA:  TIA, hypertension, atrial fibrillation EXAM: CHEST - 2 VIEW  COMPARISON:  06/25/2019 FINDINGS: Frontal and lateral views of the chest demonstrate an unremarkable cardiac silhouette. Lung volumes are diminished, without airspace disease, effusion, or pneumothorax. No acute bony abnormalities. IMPRESSION: 1. No acute intrathoracic process. Electronically Signed   By: Randa Ngo M.D.   On: 12/31/2019 15:33   CT ANGIO NECK W OR WO CONTRAST  Result Date: 12/31/2019 CLINICAL DATA:  Code stroke follow-up EXAM: CT ANGIOGRAPHY HEAD AND NECK CT PERFUSION BRAIN TECHNIQUE: Multidetector CT imaging of the head and neck was performed using the standard protocol during bolus administration of intravenous contrast. Multiplanar CT image reconstructions and MIPs were obtained to evaluate the vascular anatomy. Carotid stenosis measurements (when applicable) are obtained utilizing NASCET criteria, using the distal internal carotid diameter as the denominator. Multiphase CT imaging of the brain was performed following IV bolus contrast injection. Subsequent parametric perfusion maps  were calculated using RAPID software. CONTRAST:  100 mL Omnipaque 350 COMPARISON:  None. FINDINGS: CTA NECK FINDINGS Aortic arch: Mild calcified plaque along the arch and at the patent great vessel origins. Right carotid system: Patent. Mild plaque the carotid. There is primarily calcified plaque at the ICA origin causing nearly 70% stenosis. Left carotid system: Patent. Mild plaque along the common carotid. There is mild calcified plaque at the ICA origin without high-grade stenosis. Just beyond the origin, there is additional primarily noncalcified plaque with resulting near occlusion over a short segment with subsequent reconstitution. Vertebral arteries: Patent. Left vertebral artery is dominant. There is calcified plaque at the vertebral artery origins causing mild stenosis. Skeleton: Multilevel degenerative changes of the cervical spine. Other neck: No mass or adenopathy. Upper chest: Included upper lungs are clear. Review of the MIP images confirms the above findings CTA HEAD FINDINGS Anterior circulation: Intracranial internal carotid arteries patent with calcified plaque. There is moderate to severe stenosis of the proximal supraclinoid portions bilaterally. Anterior cerebral arteries are patent. There is severe stenosis of the left A1 ACA. Middle cerebral arteries are patent with left M1 MCA atherosclerotic irregularity. Posterior circulation: Intracranial vertebral arteries are patent minimal calcified plaque. Basilar artery is patent with atherosclerotic irregularity and mild to moderate stenosis. Posterior cerebral arteries are patent atherosclerotic irregularity and mild stenosis. Venous sinuses: As permitted by contrast timing, patent. Corresponding to suspected abnormality on noncontrast CT, there remains a possible mass lesion along the left precentral gyrus. Review of the MIP images confirms the above findings CT Brain Perfusion Findings: CBF (<30%) Volume: 13mL Perfusion (Tmax>6.0s) volume: 107mL  Mismatch Volume: 85mL Infarction Location: None IMPRESSION: No large vessel occlusion. Plaque at the right ICA origin causes nearly 70% stenosis. There is short segment near occlusion of the proximal left ICA with reconstitution. Intracranial atherosclerosis including moderate to severe stenoses of the proximal supraclinoid ICAs and severe stenosis of the left A1 ACA. Perfusion imaging demonstrates no evidence of core infarction. There is calculated territory at risk in the left MCA territory and left MCA/PCA watershed. Persistent possible small mass lesion along the left precentral gyrus. MRI with contrast is recommended for further evaluation. These results were called by telephone at the time of interpretation on 12/31/2019 at 2:02 pm to provider [facility] , who verbally acknowledged these results. Electronically Signed   By: Macy Mis M.D.   On: 12/31/2019 14:17   CT CEREBRAL PERFUSION W CONTRAST  Result Date: 12/31/2019 CLINICAL DATA:  Code stroke follow-up EXAM: CT ANGIOGRAPHY HEAD AND NECK CT PERFUSION BRAIN TECHNIQUE: Multidetector CT imaging of the head and neck was performed using the  standard protocol during bolus administration of intravenous contrast. Multiplanar CT image reconstructions and MIPs were obtained to evaluate the vascular anatomy. Carotid stenosis measurements (when applicable) are obtained utilizing NASCET criteria, using the distal internal carotid diameter as the denominator. Multiphase CT imaging of the brain was performed following IV bolus contrast injection. Subsequent parametric perfusion maps were calculated using RAPID software. CONTRAST:  100 mL Omnipaque 350 COMPARISON:  None. FINDINGS: CTA NECK FINDINGS Aortic arch: Mild calcified plaque along the arch and at the patent great vessel origins. Right carotid system: Patent. Mild plaque the carotid. There is primarily calcified plaque at the ICA origin causing nearly 70% stenosis. Left carotid system: Patent. Mild plaque  along the common carotid. There is mild calcified plaque at the ICA origin without high-grade stenosis. Just beyond the origin, there is additional primarily noncalcified plaque with resulting near occlusion over a short segment with subsequent reconstitution. Vertebral arteries: Patent. Left vertebral artery is dominant. There is calcified plaque at the vertebral artery origins causing mild stenosis. Skeleton: Multilevel degenerative changes of the cervical spine. Other neck: No mass or adenopathy. Upper chest: Included upper lungs are clear. Review of the MIP images confirms the above findings CTA HEAD FINDINGS Anterior circulation: Intracranial internal carotid arteries patent with calcified plaque. There is moderate to severe stenosis of the proximal supraclinoid portions bilaterally. Anterior cerebral arteries are patent. There is severe stenosis of the left A1 ACA. Middle cerebral arteries are patent with left M1 MCA atherosclerotic irregularity. Posterior circulation: Intracranial vertebral arteries are patent minimal calcified plaque. Basilar artery is patent with atherosclerotic irregularity and mild to moderate stenosis. Posterior cerebral arteries are patent atherosclerotic irregularity and mild stenosis. Venous sinuses: As permitted by contrast timing, patent. Corresponding to suspected abnormality on noncontrast CT, there remains a possible mass lesion along the left precentral gyrus. Review of the MIP images confirms the above findings CT Brain Perfusion Findings: CBF (<30%) Volume: 6mL Perfusion (Tmax>6.0s) volume: 82mL Mismatch Volume: 30mL Infarction Location: None IMPRESSION: No large vessel occlusion. Plaque at the right ICA origin causes nearly 70% stenosis. There is short segment near occlusion of the proximal left ICA with reconstitution. Intracranial atherosclerosis including moderate to severe stenoses of the proximal supraclinoid ICAs and severe stenosis of the left A1 ACA. Perfusion imaging  demonstrates no evidence of core infarction. There is calculated territory at risk in the left MCA territory and left MCA/PCA watershed. Persistent possible small mass lesion along the left precentral gyrus. MRI with contrast is recommended for further evaluation. These results were called by telephone at the time of interpretation on 12/31/2019 at 2:02 pm to provider [facility] , who verbally acknowledged these results. Electronically Signed   By: Macy Mis M.D.   On: 12/31/2019 14:17   DG Abd Portable 1V  Result Date: 01/01/2020 CLINICAL DATA:  Altered mental status. Clearance for MRI EXAM: PORTABLE ABDOMEN - 1 VIEW COMPARISON:  None. FINDINGS: The bowel gas pattern is unremarkable. Vascular calcifications are noted. No significant bony findings. No metallic foreign bodies are identified. IMPRESSION: Unremarkable abdominal radiographs. Electronically Signed   By: Marijo Sanes M.D.   On: 01/01/2020 05:21   ECHOCARDIOGRAM COMPLETE  Result Date: 12/31/2019    ECHOCARDIOGRAM REPORT   Patient Name:   Robert Salinas. Date of Exam: 12/31/2019 Medical Rec #:  [id number]            Height:       70.0 in Accession #:    [id number]  Weight:       185.8 lb Date of Birth:  1927/04/07             BSA:          2.023 m Patient Age:    89 years             BP:           140/83 mmHg Patient Gender: M                    HR:           69 bpm. Exam Location:  Inpatient Procedure: 2D Echo Indications:    TIA  History:        Patient has no prior history of Echocardiogram examinations.                 Arrythmias:Atrial Fibrillation; Risk Factors:Diabetes,                 Hypertension and Dyslipidemia.  Sonographer:    Jannett Celestine RDCS (AE) Referring Phys: [id number] Lequita Halt  Sonographer Comments: restricted mobility. IMPRESSIONS  1. Left ventricular ejection fraction, by estimation, is 60 to 65%. The left ventricle has normal function. The left ventricle has no regional wall motion abnormalities. There is  moderate concentric left ventricular hypertrophy. Left ventricular diastolic parameters are consistent with Grade I diastolic dysfunction (impaired relaxation). Elevated left ventricular end-diastolic pressure.  2. Right ventricular systolic function is normal. The right ventricular size is normal.  3. The mitral valve is normal in structure. No evidence of mitral valve regurgitation. No evidence of mitral stenosis.  4. The aortic valve is tricuspid. Aortic valve regurgitation is not visualized. No aortic stenosis is present.  5. Aortic dilatation noted. There is moderate dilatation at the level of the sinuses of Valsalva measuring 46 mm.  6. The inferior vena cava is normal in size with greater than 50% respiratory variability, suggesting right atrial pressure of 3 mmHg. FINDINGS  Left Ventricle: Left ventricular ejection fraction, by estimation, is 60 to 65%. The left ventricle has normal function. The left ventricle has no regional wall motion abnormalities. The left ventricular internal cavity size was normal in size. There is  moderate concentric left ventricular hypertrophy. Left ventricular diastolic parameters are consistent with Grade I diastolic dysfunction (impaired relaxation). Elevated left ventricular end-diastolic pressure. Right Ventricle: The right ventricular size is normal. No increase in right ventricular wall thickness. Right ventricular systolic function is normal. Left Atrium: Left atrial size was normal in size. Right Atrium: Right atrial size was normal in size. Pericardium: There is no evidence of pericardial effusion. Mitral Valve: The mitral valve is normal in structure. Normal mobility of the mitral valve leaflets. No evidence of mitral valve regurgitation. No evidence of mitral valve stenosis. Tricuspid Valve: The tricuspid valve is normal in structure. Tricuspid valve regurgitation is not demonstrated. No evidence of tricuspid stenosis. Aortic Valve: The aortic valve is tricuspid.  Aortic valve regurgitation is not visualized. No aortic stenosis is present. Pulmonic Valve: The pulmonic valve was normal in structure. Pulmonic valve regurgitation is not visualized. No evidence of pulmonic stenosis. Aorta: Aortic dilatation noted. There is moderate dilatation at the level of the sinuses of Valsalva measuring 46 mm. Venous: The inferior vena cava is normal in size with greater than 50% respiratory variability, suggesting right atrial pressure of 3 mmHg. IAS/Shunts: No atrial level shunt detected by color flow Doppler.  LEFT VENTRICLE PLAX 2D LVIDd:  2.80 cm  Diastology LVIDs:         2.30 cm  LV e' lateral:   5.33 cm/s LV PW:         1.40 cm  LV E/e' lateral: 17.2 LV IVS:        1.40 cm  LV e' medial:    4.62 cm/s LVOT diam:     2.40 cm  LV E/e' medial:  19.9 LV SV:         72 LV SV Index:   36 LVOT Area:     4.52 cm  LEFT ATRIUM           Index LA diam:      3.80 cm 1.88 cm/m LA Vol (A2C): 39.2 ml 19.38 ml/m  AORTIC VALVE LVOT Vmax:   70.80 cm/s LVOT Vmean:  55.500 cm/s LVOT VTI:    0.159 m  AORTA Ao Root diam: 4.10 cm MITRAL VALVE MV Area (PHT): 2.20 cm     SHUNTS MV Decel Time: 345 msec     Systemic VTI:  0.16 m MV E velocity: 91.90 cm/s   Systemic Diam: 2.40 cm MV A velocity: 115.00 cm/s MV E/A ratio:  0.80 Skeet Latch MD Electronically signed by Skeet Latch MD Signature Date/Time: 12/31/2019/5:08:30 PM    Final    CT HEAD CODE STROKE WO CONTRAST  Addendum Date: 12/31/2019   ADDENDUM REPORT: 12/31/2019 13:50 ADDENDUM: Question of a subcentimeter peripherally hyperdense lesion along the left precentral gyrus (series 3, image 22). This can be further evaluated on upcoming CTA. Electronically Signed   By: Macy Mis M.D.   On: 12/31/2019 13:50   Result Date: 12/31/2019 CLINICAL DATA:  Code stroke. EXAM: CT HEAD WITHOUT CONTRAST TECHNIQUE: Contiguous axial images were obtained from the base of the skull through the vertex without intravenous contrast. COMPARISON:   06/25/2019 FINDINGS: Brain: There is no acute intracranial hemorrhage, mass effect, or edema. No new loss of gray-white differentiation. Prominence of the ventricles and sulci reflects stable generalized parenchymal volume loss. Patchy and confluent areas of hypoattenuation in the supratentorial white matter are nonspecific but probably reflect stable advanced chronic microvascular ischemic changes. Vascular: No hyperdense vessel. There is intracranial atherosclerotic calcification at skull base. Skull: Unremarkable. Sinuses/Orbits: Patchy mucosal thickening. Bilateral lens replacements. Other: Mastoid air cells are clear. ASPECTS ([place] Stroke Program Early CT Score) - Ganglionic level infarction (caudate, lentiform nuclei, internal capsule, insula, M1-M3 cortex): 7 - Supraganglionic infarction (M4-M6 cortex): 3 Total score (0-10 with 10 being normal): 10 IMPRESSION: No evidence of acute infarction or intracranial hemorrhage. ASPECT score is 10. Stable chronic findings detailed above. These results were communicated to Dr. Erlinda Hong At 1:39 pmon 7/14/2021by text page via the [facility] messaging system. Electronically Signed: By: Macy Mis M.D. On: 12/31/2019 13:40        Scheduled Meds: .  stroke: mapping our early stages of recovery book   Does not apply Once  . ammonium lactate  1 application Topical QHS  . aspirin  300 mg Rectal Daily   Or  . aspirin EC  325 mg Oral Daily  . atorvastatin  10 mg Oral Daily  . donepezil  10 mg Oral QHS  . heparin  5,000 Units Subcutaneous Q12H  . insulin aspart  0-9 Units Subcutaneous TID WC  . lactose free nutrition  237 mL Oral BID BM  . levothyroxine  44 mcg Intravenous Daily   Continuous Infusions: . sodium chloride 50 mL/hr at 01/01/20 1158  LOS: 1 day     Georgette Shell, MD 01/01/2020, 1:29 PM

## 2020-01-01 NOTE — ED Notes (Signed)
Update given to [facility], pts daughter

## 2020-01-02 DIAGNOSIS — D496 Neoplasm of unspecified behavior of brain: Secondary | ICD-10-CM

## 2020-01-02 LAB — GLUCOSE, CAPILLARY
Glucose-Capillary: 79 mg/dL (ref 70–99)
Glucose-Capillary: 84 mg/dL (ref 70–99)
Glucose-Capillary: 137 mg/dL — ABNORMAL HIGH (ref 70–99)
Glucose-Capillary: 123 mg/dL — ABNORMAL HIGH (ref 70–99)

## 2020-01-02 MED ORDER — HALOPERIDOL 0.5 MG PO TABS: 1.0000 mg | ORAL_TABLET | Freq: Four times a day (QID) | ORAL | Status: DC | PRN

## 2020-01-02 MED ORDER — HALOPERIDOL LACTATE 5 MG/ML IJ SOLN: 1.0000 mg | Freq: Four times a day (QID) | INTRAMUSCULAR | Status: DC | PRN

## 2020-01-02 MED ORDER — LEVOTHYROXINE SODIUM 88 MCG PO TABS
88.0000 ug | ORAL_TABLET | Freq: Every day | ORAL | Status: DC
  Administered 2020-01-03 – 2020-01-05 (×3): 88 ug via ORAL
  Filled 2020-01-02 (×3): qty 1

## 2020-01-02 NOTE — Evaluation (Signed)
Physical Therapy Evaluation Patient Details Name: Robert Salinas. MRN: [id number] DOB: [birth year] Today's Date: 01/02/2020   History of Present Illness  86 yo male admitted with aphasia. MRI pending  PMH alzheimers essential HTN Afib CKD DM2  Clinical Impression  Patient presents with baseline cognitive deficits and aphasia. Pt very HOH and does better when spoken into right ear. Follows simple commands. Pt is from [place] memory care. Tolerated bed mobility, transfers and side steps along side bed with RW for support and mod-max A. Responds well to warm blanket. Will follow acutely to maximize independence and mobility prior to return to [facility].     Follow Up Recommendations Other (comment);Supervision for mobility/OOB;Home health PT (return to [place])    Equipment Recommendations  None recommended by PT    Recommendations for Other Services       Precautions / Restrictions Precautions Precautions: Fall Precaution Comments: mittens at this time Restrictions Weight Bearing Restrictions: No      Mobility  Bed Mobility Overal bed mobility: Needs Assistance Bed Mobility: Supine to Sit;Sit to Supine     Supine to sit: +2 for physical assistance;Max assist Sit to supine: +2 for physical assistance;Max assist   General bed mobility comments: pt requires (A) to elevate from bed surface and sustain EOB. pt falling over to the L . pt with increased time using L UE to push himself up and static sit. Pt does make request to lay back down appropriately without initating  Transfers Overall transfer level: Needs assistance Equipment used: Rolling walker (2 wheeled) Transfers: Sit to/from Stand Sit to Stand: +2 physical assistance;Mod assist;From elevated surface         General transfer comment: pt with bed elevated to help with initial transfer onto a RW. pt able to state side steps toward [facility]. pt able to static stand with (A) for bed linen to be changed.    Ambulation/Gait Ambulation/Gait assistance: Min assist;+2 safety/equipment Gait Distance (Feet): 4 Feet Assistive device: Rolling walker (2 wheeled)       General Gait Details: Able to side step along side bed with use of RW for support.  Stairs            Wheelchair Mobility    Modified Rankin (Stroke Patients Only) Modified Rankin (Stroke Patients Only) Pre-Morbid Rankin Score: Moderately severe disability Modified Rankin: Moderately severe disability     Balance Overall balance assessment: Needs assistance Sitting-balance support: Single extremity supported;Feet supported Sitting balance-Leahy Scale: Fair Sitting balance - Comments: Initially favoring left lateral lean but able to get to midline with cues and sustain for short periods Postural control: Left lateral lean Standing balance support: Bilateral upper extremity supported;During functional activity Standing balance-Leahy Scale: Poor Standing balance comment: reliance on therapist (A)                             Pertinent Vitals/Pain Pain Assessment: Faces Faces Pain Scale: No hurt    Home Living Family/patient expects to be discharged to:: Skilled nursing facility     Type of Home: [place]           Additional Comments: appears patient is from [facility] PTA and could return    Prior Function Level of Independence: Needs assistance         Comments: Pt not the best historian due to [facility] so not able to get more details regarding mobility.  Hand Dominance   Dominant Hand: Right    Extremity/Trunk Assessment   Upper Extremity Assessment Upper Extremity Assessment: Defer to OT evaluation;Generalized weakness    Lower Extremity Assessment Lower Extremity Assessment: Generalized weakness    Cervical / Trunk Assessment Cervical / Trunk Assessment: Kyphotic  Communication   Communication: HOH (speak to right ear)  Cognition  Arousal/Alertness: Awake/alert Behavior During Therapy: Flat affect Overall Cognitive Status: History of cognitive impairments - at baseline                                 General Comments: pt repeating himself. pt when provided information in R ear states "alright" and agreeable      General Comments      Exercises     Assessment/Plan    PT Assessment Patient needs continued PT services  PT Problem List Decreased strength;Decreased mobility;Decreased safety awareness;Decreased cognition;Decreased balance;Decreased activity tolerance       PT Treatment Interventions Therapeutic activities;Gait training;Therapeutic exercise;Patient/family education;Balance training;Functional mobility training;Neuromuscular re-education    PT Goals (Current goals can be found in the Care Plan section)  Acute Rehab PT Goals Patient Stated Goal: to lay down with warm blanket PT Goal Formulation: With patient Time For Goal Achievement: 01/16/20 Potential to Achieve Goals: Fair    Frequency Min 2X/week   Barriers to discharge        Co-evaluation PT/OT/SLP Co-Evaluation/Treatment: Yes Reason for Co-Treatment: For patient/therapist safety;To address functional/ADL transfers;Necessary to address cognition/behavior during functional activity PT goals addressed during session: Mobility/safety with mobility;Balance;Strengthening/ROM OT goals addressed during session: ADL's and self-care;Proper use of Adaptive equipment and DME;Strengthening/ROM       AM-PAC PT "6 Clicks" Mobility  Outcome Measure Help needed turning from your back to your side while in a flat bed without using bedrails?: A Little Help needed moving from lying on your back to sitting on the side of a flat bed without using bedrails?: A Lot Help needed moving to and from a bed to a chair (including a wheelchair)?: A Lot Help needed standing up from a chair using your arms (e.g., wheelchair or bedside chair)?: A  Lot Help needed to walk in hospital room?: A Lot Help needed climbing 3-5 steps with a railing? : Total 6 Click Score: 12    End of Session   Activity Tolerance: Patient tolerated treatment well;Patient limited by lethargy Patient left: in bed;with bed alarm set;with restraints reapplied;with call bell/phone within reach Nurse Communication: Mobility status PT Visit Diagnosis: Muscle weakness (generalized) (M62.81);Unsteadiness on feet (R26.81)    Time: 1856-3149 PT Time Calculation (min) (ACUTE ONLY): 23 min   Charges:   PT Evaluation $PT Eval Moderate Complexity: 1 Mod          Marisa Severin, PT, DPT Acute Rehabilitation Services Pager [phone] Office [phone]      [place] 01/02/2020, 2:57 PM

## 2020-01-02 NOTE — Progress Notes (Signed)
Patient confused has pulled out two IV's this evening and keeps pulling off telemetry monitor.Attempt to reorient patient but unsuccessful. Green mitts placed to keep patient from interfering with medical equipment.Will monitor.

## 2020-01-02 NOTE — Progress Notes (Signed)
STROKE TEAM PROGRESS NOTE   INTERVAL HISTORY Pt sitting in bed, awake alert, interactive, however, word salad, receptive aphasia but was able to tell me his name but not able to answer other questions. Moving all extremities well. Not able to cooperative with MRI, will do CT with and without contrast. Pt pulled 5 IVs yesterday, now IV team is working to put a new one for CT with contrast.    Vitals:   01/02/20 0000 01/02/20 0400 01/02/20 0819 01/02/20 1124  BP:  (!) 167/82 (!) 156/87 122/63  Pulse:  68 70 82  Resp:  18 18 16   Temp:  97.6 F (36.4 C) (!) 97 F (36.1 C) (!) 96.9 F (36.1 C)  TempSrc:  Axillary Axillary Axillary  SpO2:  96% 100% 96%  Weight: 74.1 kg     Height:       CBC:  Recent Labs  Lab 12/31/19 1325 12/31/19 1331  WBC 4.6  --   NEUTROABS 3.1  --   HGB 11.2* 11.2*  HCT 35.4* 33.0*  MCV 93.7  --   PLT 171  --    Basic Metabolic Panel:  Recent Labs  Lab 12/31/19 1325 12/31/19 1325 12/31/19 1331 01/01/20 0449  NA 141   < > 142 143  K 5.0   < > 4.9 4.3  CL 108   < > 107 109  CO2 23  --   --  24  GLUCOSE 307*   < > 297* 121*  BUN 35*   < > 37* 28*  CREATININE 1.69*   < > 1.80* 1.56*  CALCIUM 8.8*  --   --  8.7*   < > = values in this interval not displayed.   Lipid Panel:  Recent Labs  Lab 01/01/20 0449  CHOL 184  TRIG 90  HDL 44  CHOLHDL 4.2  VLDL 18  LDLCALC 122*   HgbA1c:  Recent Labs  Lab 01/01/20 0449  HGBA1C 7.8*   Urine Drug Screen: No results for input(s): LABOPIA, COCAINSCRNUR, LABBENZ, AMPHETMU, THCU, LABBARB in the last 168 hours.  Alcohol Level No results for input(s): ETH in the last 168 hours.  IMAGING past 24 hours No results found.  PHYSICAL EXAM  Temp:  [96.9 F (36.1 C)-98.4 F (36.9 C)] 96.9 F (36.1 C) (07/16 1124) Pulse Rate:  [51-82] 82 (07/16 1124) Resp:  [12-18] 16 (07/16 1124) BP: (122-174)/(63-88) 122/63 (07/16 1124) SpO2:  [96 %-100 %] 96 % (07/16 1124) Weight:  [74.1 kg] 74.1 kg (07/16  0000)  General - mildly cachectic, well developed, in no acute distress.    Ophthalmologic - fundi not visualized due to noncooperation.    Cardiovascular - regular rhythm and rate  Neuro - awake, alert, eyes open, receptive aphasia with words salad, talkative but not make sense. Not cooperative on naming or repetition, hard of  Hearing. Tracking with eyes bilaterally, no gaze palsy, inconsistently blinking to visual threat bilaterally.  No significant facial droop.  Tongue protrusion not corporative.  Bilateral upper extremity at least 4/5, symmetrical.  Bilateral lower extremity at least 4/5, symmetrical. Sensation, coordination not cooperative and gait not tested.   ASSESSMENT/PLAN Robert Salinas. is a 86 y.o. male with history of f alzheimer's dementia, hypothyroidism, remote AFib on ASA, well-controlled T2DM, and HTN presenting with aphasia and altered mental status. Plans to give tPA squelched when CT showed possible small L frontotemporal brain mass.   Stroke:   L MCA infarct in setting of L ICA high-grade  stenosis (large vessel disease)  Code Stroke CT head No acute abnormality. Possible small hyperdense L precentral gyrus mass. ASPECTS 10.     CTA head & neck no LVO.  R ICA origin 70%. Proximal L ICA short near occlusion. Proximal supraclinoid ICA moderate to severe.  L A1 severe stenosis. Possible small mass L precentral gyrus.   CT perfusion no core. L MCA, L MCA/PCA watershed territory at risk  MRI w/w/o not cooperative  CT w/wo pending  2D Echo EF 60-65%. No source of embolus   LDL 122  HgbA1c 7.8  VTE prophylaxis - Heparin 5000 units sq tid   aspirin 81 mg daily prior to admission, now on ASA 325mg . Continue on discharge. Pt not AC candidate now (see below)  Therapy recommendations:  pending   Disposition:  pending  (admitted from [place])  Left ICA high grade stenosis  CTA showed left ICA short segment near occlusion  Likely the cause of  stroke  However, pt not candidate for CEA or CAS now due to aphasia and dementia  Will follow up with [facility] as outpt  Possible intracranial Mass  Code Stroke CT head Possible small hyperdense L precentral gyrus mass.   MRI w/wo not cooperative  CT w/wo pending  Not tPA candidate  Paroxysmal Atrial Fibrillation  Home anticoagulation:  none   Not on AC due to fall risk  . On ASA at [facility] . Not an AC at this time d/t acute stroke and possible intracranial mass . Not good candidate for [facility] at this time given fall risk, dementia, acute infarct   Hypertension  Home meds:  norvasc 2.5  Stable . Long-term BP goal 130-150 given b/l ICA stenosis  Hyperlipidemia  Home meds:  lipitor 10   LDL 122, goal < 70  Resumed statin   Continue statin at discharge  Diabetes type II Uncontrolled  Home meds:  metformin  HgbA1c 7.8, goal < 7.0  Glucose 412 on admission   CBGs  SSI  Close PCP follow up for better DM control  CKD stage III  Cre 1.69-1.80-1.56  On IVF  Continue BMP monitoring  Dysphagia . Secondary to stroke . NPO . Speech on board . On IVF   Other Stroke Risk Factors  Advanced age  Other Active Problems  Alzheimer's Dementia on aricept  Hypothyroid on synthroid  Recent UTI, on abx 3 days PTA - UA negative this admission  Hospital day # 2   Rosalin Hawking, MD PhD Stroke Neurology 01/02/2020 12:41 PM   To contact Stroke Continuity provider, please refer to [email or web address] After hours, contact General Neurology

## 2020-01-02 NOTE — NC FL2 (Addendum)
[place] LEVEL OF CARE SCREENING TOOL     IDENTIFICATION  Patient Name: Robert Salinas. Birthdate: 19-Jan-1927 Sex: male Admission Date (Current Location): 12/31/2019  [facility] and [place] Number:  Herbalist and Address:  [facility], [place] [address] [place], [place] [address]      Provider Number: [id number]  Attending Physician Name and Address:  Georgette Shell, MD  Relative Name and Phone Number:       Current Level of Care: Hospital Recommended Level of Care: Memory Care Prior Approval Number:    Date Approved/Denied:   PASRR Number:    Discharge Plan: Other (Comment) (Memory Care)    Current Diagnoses: Patient Active Problem List   Diagnosis Date Noted   Cerebral embolism with cerebral infarction 12/31/2019   TIA (transient ischemic attack) 12/31/2019   Transient alteration of awareness    UTI (urinary tract infection) 03/20/2017   DNR (do not resuscitate) 12/08/2013   Hypothyroid 10/09/2011   CKD (chronic kidney disease), stage III 02/21/2011   Diabetes mellitus type II, controlled ([place]) 06/09/2009   Hyperlipidemia 06/09/2009   ALZHEIMER'S DISEASE, EARLY 06/09/2009   Essential hypertension 06/09/2009   Atrial fibrillation ([place]) 06/09/2009    Orientation RESPIRATION BLADDER Height & Weight     Self  Normal Incontinent Weight: 163 lb 6.4 oz (74.1 kg) Height:  5\' 10"  (177.8 cm)  BEHAVIORAL SYMPTOMS/MOOD NEUROLOGICAL BOWEL NUTRITION STATUS      Incontinent Diet (regular)  AMBULATORY STATUS COMMUNICATION OF NEEDS Skin   Limited Assist Verbally Normal                       Personal Care Assistance Level of Assistance  Bathing, Feeding, Dressing Bathing Assistance: Limited assistance Feeding assistance: Limited assistance Dressing Assistance: Limited assistance     Functional Limitations Info  Speech     Speech Info: Impaired (dysarthria)    SPECIAL CARE FACTORS FREQUENCY   PT (By licensed PT), OT (By licensed OT)     PT Frequency: 3x/wk with HH OT Frequency: 3x/wk with HH            Contractures Contractures Info: Not present    Additional Factors Info  Code Status, Allergies, Psychotropic Code Status Info: DNR Allergies Info: NKA Psychotropic Info: Aricept 10mg  daily         Current Medications (01/02/2020):    Discharge Medications: acetaminophen 325 MG tablet Commonly known as: TYLENOL Take 2 tablets (650 mg total) by mouth every 4 (four) hours as needed for mild pain (or temp > 37.5 C (99.5 F)).   amLODipine 2.5 MG tablet Commonly known as: NORVASC Take 1 tablet (2.5 mg total) by mouth daily. What changed: when to take this   ammonium lactate 12 % lotion Commonly known as: LAC-HYDRIN Apply 1 application topically See admin instructions. Apply to both feet at bedtime   Combivent Respimat 20-100 MCG/ACT Aers respimat Generic drug: Ipratropium-Albuterol Inhale 1 puff into the lungs in the morning and at bedtime.   glucose blood test strip Commonly known as: OneTouch Verio Use to test blood sugars daily. Dx: E11.9   lactose free nutrition Liqd Take 237 mLs by mouth 2 (two) times daily between meals.   levothyroxine 88 MCG tablet Commonly known as: SYNTHROID Take 88 mcg by mouth daily before breakfast.   montelukast 10 MG tablet Commonly known as: SINGULAIR Take 10 mg by mouth daily.   onetouch ultrasoft lancets Use to test blood  sugars daily. Dx: E11.9   ProAir HFA 108 (90 Base) MCG/ACT inhaler Generic drug: albuterol Inhale 2 puffs into the lungs 4 (four) times daily as needed for wheezing or shortness of breath.   Robafen DM Cgh/Chest Congest 10-100 MG/5ML liquid Generic drug: dextromethorphan-guaiFENesin Take 5 mLs by mouth 3 (three) times daily as needed for cough.   Voltaren 1 % Gel Generic drug: diclofenac Sodium Apply 2 g topically 3 (three) times daily as needed (for pain- RIGHT ELBOW).      Relevant Imaging Results:  Relevant Lab Results:   Additional Information SS#: [id number]  Geralynn Ochs, LCSW

## 2020-01-02 NOTE — TOC Initial Note (Signed)
Transition of Care (TOC) - Initial/Assessment Note    Patient Details  Name: Robert Salinas. MRN: [id number] Date of Birth: 02-21-1927  Transition of Care [facility]) CM/SW Contact:    Geralynn Ochs, LCSW Phone Number: 01/02/2020, 2:14 PM  Clinical Narrative:      CSW spoke with patient's daughter in law [name] to confirm plan to return to [facility] at discharge. CSW updated [name] that MD is hopeful for patient to return tomorrow if medical workup can complete today. CSW contacted [facility] and spoke with [place], provided update and that MD is hopeful for patient to return tomorrow. [facility] can accept patient back over the weekend but will need FL2 and discharge summary for review first. CSW gave [place] contact information for weekend CSW. [place] requested home health be setup for the patient, and they utilize [facility] on site. CSW contacted [facility] to provide referral, left a message.              Expected Discharge Plan: Memory Care Barriers to Discharge: Continued Medical Work up   Patient Goals and CMS Choice Patient states their goals for this hospitalization and ongoing recovery are:: patient unable to participate in goal setting due to disorientation CMS Medicare.gov Compare Post Acute Care list provided to:: Patient Represenative (must comment) Choice offered to / list presented to : Adult Children  Expected Discharge Plan and Services Expected Discharge Plan: Memory Care     Post Acute Care Choice: Home Health, Nursing Home Living arrangements for the past 2 months: Assisted Living Facility                           HH Arranged: RN, PT, OT [facility] Agency: [facility] Date [place]: 01/02/20   Representative spoke with at [place]  Prior Living Arrangements/Services Living arrangements for the past 2 months: [place] Lives with:: Facility Resident Patient language and need  for interpreter reviewed:: No Do you feel safe going back to the place where you live?: Yes      Need for Family Participation in Patient Care: Yes (Comment) Care giver support system in place?: Yes (comment) Current home services: DME Criminal Activity/Legal Involvement Pertinent to Current Situation/Hospitalization: No - Comment as needed  Activities of Daily Living      Permission Sought/Granted Permission sought to share information with : Family Supports, Chartered certified accountant granted to share information with : Yes, Verbal Permission Granted  Share Information with NAME: Tito Dine  Permission granted to share info w AGENCY: [place]  Permission granted to share info w Relationship: Children     Emotional Assessment Appearance:: Appears stated age Attitude/Demeanor/Rapport: Unable to Assess Affect (typically observed): Unable to Assess Orientation: : Oriented to Self Alcohol / Substance Use: Not Applicable Psych Involvement: No (comment)  Admission diagnosis:  Transient alteration of awareness [R40.4] TIA (transient ischemic attack) [G45.9] AMS (altered mental status) [R41.82] Patient Active Problem List   Diagnosis Date Noted  . Cerebral embolism with cerebral infarction 12/31/2019  . TIA (transient ischemic attack) 12/31/2019  . Transient alteration of awareness   . UTI (urinary tract infection) 03/20/2017  . DNR (do not resuscitate) 12/08/2013  . Hypothyroid 10/09/2011  . CKD (chronic kidney disease), stage III 02/21/2011  . Diabetes mellitus type II, controlled ([place]) 06/09/2009  . Hyperlipidemia 06/09/2009  . ALZHEIMER'S DISEASE, EARLY 06/09/2009  . Essential hypertension 06/09/2009  . Atrial  fibrillation ([place]) 06/09/2009   PCP:  Patient, No Pcp Per Pharmacy:   [facility], [place]. [place]. [place] [address] Phone: [phone] Fax: [phone]  [facility], [place] [address] [place] [address] Phone: [phone] Fax: [phone]     Social Determinants of Health (SDOH) Interventions    Readmission Risk Interventions No flowsheet data found.

## 2020-01-02 NOTE — Evaluation (Signed)
Speech Language Pathology Evaluation Patient Details Name: Robert Salinas. MRN: [id number] DOB: [birth year] Today's Date: 01/02/2020 Time: 9326-7124 SLP Time Calculation (min) (ACUTE ONLY): 20 min  Problem List:  Patient Active Problem List   Diagnosis Date Noted  . Cerebral embolism with cerebral infarction 12/31/2019  . TIA (transient ischemic attack) 12/31/2019  . Transient alteration of awareness   . UTI (urinary tract infection) 03/20/2017  . DNR (do not resuscitate) 12/08/2013  . Hypothyroid 10/09/2011  . CKD (chronic kidney disease), stage III 02/21/2011  . Diabetes mellitus type II, controlled ([place]) 06/09/2009  . Hyperlipidemia 06/09/2009  . ALZHEIMER'S DISEASE, EARLY 06/09/2009  . Essential hypertension 06/09/2009  . Atrial fibrillation ([place]) 06/09/2009   Past Medical History:  Past Medical History:  Diagnosis Date  . Alzheimer disease ([place])   . Atrial fibrillation ([place])   . BPH (benign prostatic hyperplasia)   . Chickenpox   . Chronic kidney disease   . COLONIC POLYPS, HX OF 06/09/2009  . Dementia ([place])   . Diabetes mellitus   . Hx of colonic polyps   . Hyperlipidemia   . Hypertension   . Hypothyroidism   . Hypothyroidism   . Thrombocytopenia ([place])    Past Surgical History:  Past Surgical History:  Procedure Laterality Date  . APPENDECTOMY    . PROSTATECTOMY     BPH cause?   HPI:  86 year old male admitted from nursing home with change in mental status.  He has history of Alzheimer's dementia hypothyroidism paroxysmal atrial fibrillation type 2 diabetes and hypertension admitted with aphasia.  Patient suddenly became nonverbal and unresponsive at the nursing home.  CT negative, MRI pending.    Assessment / Plan / Recommendation Clinical Impression  Pt demonstrates potential for aphasia with receptive and expressive deficits, though given baseline dementia and no family present to report his baseline communication/cognitive ability, it is difficult to  determine extent of impairment. Pt is alert and particiapte in basic feeding with context but cannot follow one step commands even with visual model. He reponds to simple Y/N questions with repetitions and increased volume needed though accuracy to responses is poor. When asked to state his name, or answer simple open ended biographical information pt does not seem to comprehend the question, but when asked if his name is Robert Salinas he can respond yes and repeat his name, or if asked if he is from [place] he states "No, i think im from [place] but im not sure." When shown pictures he could name 2/5 images despite sustaining attention to task. Pt could benefit from f/u speech therapy for aphasia in the SNF setting where is cognitive baseline is known. Will defer acute therapy.     SLP Assessment  SLP Recommendation/Assessment: All further Speech Lanaguage Pathology  needs can be addressed in the next venue of care SLP Visit Diagnosis: Aphasia (R47.01)    Follow Up Recommendations  Skilled Nursing facility    Frequency and Duration           SLP Evaluation Cognition  Overall Cognitive Status: Difficult to assess Arousal/Alertness: Awake/alert Orientation Level: Oriented to person;Disoriented to place;Disoriented to time;Disoriented to situation Attention: Focused;Sustained Focused Attention: Appears intact Sustained Attention: Impaired Sustained Attention Impairment: Verbal basic;Functional basic Memory: Impaired Memory Impairment: Decreased short term memory;Decreased long term memory Decreased Long Term Memory: Verbal basic Decreased Short Term Memory: Verbal basic Awareness: Impaired Awareness Impairment: Intellectual impairment;Emergent impairment;Anticipatory impairment Problem Solving: Impaired Problem Solving Impairment: Verbal basic;Functional basic Executive Function: Initiating Initiating: Impaired Initiating Impairment:  Functional basic       Comprehension   Auditory Comprehension Overall Auditory Comprehension: Impaired Yes/No Questions: Within Functional Limits Commands: Impaired One Step Basic Commands: 0-24% accurate Conversation: Simple Interfering Components: Attention;Working Marine scientist Reading Comprehension Reading Status: Not tested    Expression Verbal Expression Overall Verbal Expression: Impaired Initiation: No impairment Automatic Speech: Name;Social Response Level of Generative/Spontaneous Verbalization: Word;Phrase Repetition: No impairment Naming: Impairment Responsive: Not tested Confrontation: Impaired Convergent: Not tested Divergent: Not tested Interfering Components: Premorbid deficit   Oral / Motor  Oral Motor/Sensory Function Overall Oral Motor/Sensory Function: Other (comment) Motor Speech Overall Motor Speech: Appears within functional limits for tasks assessed   GO                    Adamary Savary, Katherene Ponto 01/02/2020, 12:58 PM

## 2020-01-02 NOTE — Progress Notes (Addendum)
PROGRESS NOTE    Robert Salinas.  HTD:[id number] DOB: August 29, 1926 DOA: 12/31/2019 PCP: Patient, No Pcp Per    Brief Narrative: 86 year old male admitted from nursing home with change in mental status.he has history of Alzheimer's dementia hypothyroidism paroxysmal atrial fibrillation type 2 diabetes and hypertension admitted with aphasia.  Patient suddenly became nonverbal and unresponsive at the nursing home.  Admitted for stroke work-up.  Assessment & Plan:   Active Problems:   Cerebral embolism with cerebral infarction   TIA (transient ischemic attack)   #1 left MCA infarct-work-up shows CT angiogram of the head and neck with 70% right ICA origin proximal left ICA near occlusion severe supraclinoid ICA stenosis possible small  mass in the precentral gyrus MRI NOT DONE YET .Marland Kitchenpatient couldn't stay still.  Will order CT head with and without contrast to take a closer look at this mass in the precentral gyrus. CT head showed no acute stroke Echo normal ejection fraction LDL 122 Hemoglobin A1c 7.8 PT OT  consults are pending Speech therapy has started him on a diet.   Patient came from assisted living facility, will likely need SNF.   #2 CKD stage III creatinine 1.56 down from 1.80 on admission.  Patient became very agitated pulled out the IV out along with telemetry monitors.  Continue to encourage p.o. intake and DC IV fluids.  #3 dysphagia due to stroke consulted speech therapy started him on a diet.  #4 history of paroxysmal atrial fibrillation not on anticoagulation at the nursing home due to risk of falls.  #5 type 2 diabetes with hyperglycemia hemoglobin A1c is 7.8.  On SSI.  Was on Metformin prior to admission. CBG (last 3)  Recent Labs    01/01/20 1845 01/01/20 2207 01/02/20 0619  GLUCAP 104* 94 79    #6 hypothyroidism on Synthroid  #7 history of Alzheimer's dementia on Aricept  #8 history of essential hypertension-blood pressure 122/63.  Continue to hold  Norvasc.     Estimated body mass index is 23.45 kg/m as calculated from the following:   Height as of this encounter: 5\' 10"  (1.778 m).   Weight as of this encounter: 74.1 kg.  DVT prophylaxis: Heparin subcu twice daily Code Status: DNR Family Communication: dw daughter  Disposition Plan:  Status is: Inpatient   Dispo: The patient is from: SNF              Anticipated d/c is to: SNF              Anticipated d/c date is: 2 days              Patient currently is not medically stable to d/c.  Patient admitted for acute stroke with ongoing work-up and pending physical and occupational therapy.  Likely will need SNF. Consultants:   Neurology  Procedures: None Antimicrobials: None Subjective: Patient resting in bed he is confused moves all extremities does not follow any commands  Objective: Vitals:   01/01/20 2300 01/02/20 0000 01/02/20 0400 01/02/20 0819  BP: 123/88  (!) 167/82 (!) 156/87  Pulse: (!) 51  68 70  Resp: 16  18 18   Temp: 97.6 F (36.4 C)  97.6 F (36.4 C) (!) 97 F (36.1 C)  TempSrc: Axillary  Axillary Axillary  SpO2:   96% 100%  Weight:  74.1 kg    Height:        Intake/Output Summary (Last 24 hours) at 01/02/2020 1111 Last data filed at 01/02/2020 0600 Gross per 24 hour  Intake 708.38 ml  Output --  Net 708.38 ml   Filed Weights   12/31/19 1418 01/02/20 0000  Weight: 84.3 kg 74.1 kg    Examination:  General exam: Appears calm and comfortable  Respiratory system: Clear to auscultation. Respiratory effort normal. Cardiovascular system: S1 & S2 heard, RRR. No JVD, murmurs, rubs, gallops or clicks. No pedal edema. Gastrointestinal system: Abdomen is nondistended, soft and nontender. No organomegaly or masses felt. Normal bowel sounds heard. Central nervous system: Confused does not answer any questions appropriately or follow any commands  extremities no edema Skin: No rashes, lesions or ulcers Psychiatry: Unable to assess.   Data Reviewed: I  have personally reviewed following labs and imaging studies  CBC: Recent Labs  Lab 12/31/19 1325 12/31/19 1331  WBC 4.6  --   NEUTROABS 3.1  --   HGB 11.2* 11.2*  HCT 35.4* 33.0*  MCV 93.7  --   PLT 171  --    Basic Metabolic Panel: Recent Labs  Lab 12/31/19 1325 12/31/19 1331 01/01/20 0449  NA 141 142 143  K 5.0 4.9 4.3  CL 108 107 109  CO2 23  --  24  GLUCOSE 307* 297* 121*  BUN 35* 37* 28*  CREATININE 1.69* 1.80* 1.56*  CALCIUM 8.8*  --  8.7*   GFR: Estimated Creatinine Clearance: 32.5 mL/min (A) (by C-G formula based on SCr of 1.56 mg/dL (H)). Liver Function Tests: Recent Labs  Lab 12/31/19 1325  AST 24  ALT 21  ALKPHOS 98  BILITOT 0.7  PROT 6.1*  ALBUMIN 3.2*   No results for input(s): LIPASE, AMYLASE in the last 168 hours. No results for input(s): AMMONIA in the last 168 hours. Coagulation Profile: Recent Labs  Lab 12/31/19 1325  INR 1.0   Cardiac Enzymes: No results for input(s): CKTOTAL, CKMB, CKMBINDEX, TROPONINI in the last 168 hours. BNP (last 3 results) No results for input(s): PROBNP in the last 8760 hours. HbA1C: Recent Labs    01/01/20 0449  HGBA1C 7.8*   CBG: Recent Labs  Lab 01/01/20 0749 01/01/20 1203 01/01/20 1845 01/01/20 2207 01/02/20 0619  GLUCAP 119* 120* 104* 94 79   Lipid Profile: Recent Labs    01/01/20 0449  CHOL 184  HDL 44  LDLCALC 122*  TRIG 90  CHOLHDL 4.2   Thyroid Function Tests: No results for input(s): TSH, T4TOTAL, FREET4, T3FREE, THYROIDAB in the last 72 hours. Anemia Panel: No results for input(s): VITAMINB12, FOLATE, FERRITIN, TIBC, IRON, RETICCTPCT in the last 72 hours. Sepsis Labs: No results for input(s): PROCALCITON, LATICACIDVEN in the last 168 hours.  Recent Results (from the past 240 hour(s))  SARS Coronavirus 2 by RT PCR (hospital order, performed in [facility] hospital lab) Nasopharyngeal Nasopharyngeal Swab     Status: None   Collection Time: 12/31/19  2:29 PM   Specimen:  Nasopharyngeal Swab  Result Value Ref Range Status   SARS Coronavirus 2 NEGATIVE NEGATIVE Final    Comment: (NOTE) SARS-CoV-2 target nucleic acids are NOT DETECTED.  The SARS-CoV-2 RNA is generally detectable in upper and lower respiratory specimens during the acute phase of infection. The lowest concentration of SARS-CoV-2 viral copies this assay can detect is 250 copies / mL. A negative result does not preclude SARS-CoV-2 infection and should not be used as the sole basis for treatment or other patient management decisions.  A negative result may occur with improper specimen collection / handling, submission of specimen other than nasopharyngeal swab, presence of viral mutation(s) within the  areas targeted by this assay, and inadequate number of viral copies (<250 copies / mL). A negative result must be combined with clinical observations, patient history, and epidemiological information.  Fact Sheet for Patients:   StrictlyIdeas.no  Fact Sheet for Healthcare Providers: BankingDealers.co.za  This test is not yet approved or  cleared by the [place] FDA and has been authorized for detection and/or diagnosis of SARS-CoV-2 by FDA under an Emergency Use Authorization (EUA).  This EUA will remain in effect (meaning this test can be used) for the duration of the COVID-19 declaration under Section 564(b)(1) of the Act, 21 U.S.C. section 360bbb-3(b)(1), unless the authorization is terminated or revoked sooner.  Performed at [facility], [place] [address], [place], [place] [address]   MRSA PCR Screening     Status: None   Collection Time: 01/01/20  9:25 PM   Specimen: Nasal Mucosa; Nasopharyngeal  Result Value Ref Range Status   MRSA by PCR NEGATIVE NEGATIVE Final    Comment:        The GeneXpert MRSA Assay (FDA approved for NASAL specimens only), is one component of a comprehensive MRSA colonization surveillance program. It  is not intended to diagnose MRSA infection nor to guide or monitor treatment for MRSA infections. Performed at [facility], [place] [address], [place], [place] [address]          Radiology Studies: CT ANGIO HEAD W OR WO CONTRAST  Result Date: 12/31/2019 CLINICAL DATA:  Code stroke follow-up EXAM: CT ANGIOGRAPHY HEAD AND NECK CT PERFUSION BRAIN TECHNIQUE: Multidetector CT imaging of the head and neck was performed using the standard protocol during bolus administration of intravenous contrast. Multiplanar CT image reconstructions and MIPs were obtained to evaluate the vascular anatomy. Carotid stenosis measurements (when applicable) are obtained utilizing NASCET criteria, using the distal internal carotid diameter as the denominator. Multiphase CT imaging of the brain was performed following IV bolus contrast injection. Subsequent parametric perfusion maps were calculated using RAPID software. CONTRAST:  100 mL Omnipaque 350 COMPARISON:  None. FINDINGS: CTA NECK FINDINGS Aortic arch: Mild calcified plaque along the arch and at the patent great vessel origins. Right carotid system: Patent. Mild plaque the carotid. There is primarily calcified plaque at the ICA origin causing nearly 70% stenosis. Left carotid system: Patent. Mild plaque along the common carotid. There is mild calcified plaque at the ICA origin without high-grade stenosis. Just beyond the origin, there is additional primarily noncalcified plaque with resulting near occlusion over a short segment with subsequent reconstitution. Vertebral arteries: Patent. Left vertebral artery is dominant. There is calcified plaque at the vertebral artery origins causing mild stenosis. Skeleton: Multilevel degenerative changes of the cervical spine. Other neck: No mass or adenopathy. Upper chest: Included upper lungs are clear. Review of the MIP images confirms the above findings CTA HEAD FINDINGS Anterior circulation: Intracranial internal carotid  arteries patent with calcified plaque. There is moderate to severe stenosis of the proximal supraclinoid portions bilaterally. Anterior cerebral arteries are patent. There is severe stenosis of the left A1 ACA. Middle cerebral arteries are patent with left M1 MCA atherosclerotic irregularity. Posterior circulation: Intracranial vertebral arteries are patent minimal calcified plaque. Basilar artery is patent with atherosclerotic irregularity and mild to moderate stenosis. Posterior cerebral arteries are patent atherosclerotic irregularity and mild stenosis. Venous sinuses: As permitted by contrast timing, patent. Corresponding to suspected abnormality on noncontrast CT, there remains a possible mass lesion along the left precentral gyrus. Review of the MIP images confirms the  above findings CT Brain Perfusion Findings: CBF (<30%) Volume: 7mL Perfusion (Tmax>6.0s) volume: 26mL Mismatch Volume: 55mL Infarction Location: None IMPRESSION: No large vessel occlusion. Plaque at the right ICA origin causes nearly 70% stenosis. There is short segment near occlusion of the proximal left ICA with reconstitution. Intracranial atherosclerosis including moderate to severe stenoses of the proximal supraclinoid ICAs and severe stenosis of the left A1 ACA. Perfusion imaging demonstrates no evidence of core infarction. There is calculated territory at risk in the left MCA territory and left MCA/PCA watershed. Persistent possible small mass lesion along the left precentral gyrus. MRI with contrast is recommended for further evaluation. These results were called by telephone at the time of interpretation on 12/31/2019 at 2:02 pm to provider [facility] , who verbally acknowledged these results. Electronically Signed   By: Macy Mis M.D.   On: 12/31/2019 14:17   DG Chest 2 View  Result Date: 12/31/2019 CLINICAL DATA:  TIA, hypertension, atrial fibrillation EXAM: CHEST - 2 VIEW COMPARISON:  06/25/2019 FINDINGS: Frontal and lateral  views of the chest demonstrate an unremarkable cardiac silhouette. Lung volumes are diminished, without airspace disease, effusion, or pneumothorax. No acute bony abnormalities. IMPRESSION: 1. No acute intrathoracic process. Electronically Signed   By: Randa Ngo M.D.   On: 12/31/2019 15:33   CT ANGIO NECK W OR WO CONTRAST  Result Date: 12/31/2019 CLINICAL DATA:  Code stroke follow-up EXAM: CT ANGIOGRAPHY HEAD AND NECK CT PERFUSION BRAIN TECHNIQUE: Multidetector CT imaging of the head and neck was performed using the standard protocol during bolus administration of intravenous contrast. Multiplanar CT image reconstructions and MIPs were obtained to evaluate the vascular anatomy. Carotid stenosis measurements (when applicable) are obtained utilizing NASCET criteria, using the distal internal carotid diameter as the denominator. Multiphase CT imaging of the brain was performed following IV bolus contrast injection. Subsequent parametric perfusion maps were calculated using RAPID software. CONTRAST:  100 mL Omnipaque 350 COMPARISON:  None. FINDINGS: CTA NECK FINDINGS Aortic arch: Mild calcified plaque along the arch and at the patent great vessel origins. Right carotid system: Patent. Mild plaque the carotid. There is primarily calcified plaque at the ICA origin causing nearly 70% stenosis. Left carotid system: Patent. Mild plaque along the common carotid. There is mild calcified plaque at the ICA origin without high-grade stenosis. Just beyond the origin, there is additional primarily noncalcified plaque with resulting near occlusion over a short segment with subsequent reconstitution. Vertebral arteries: Patent. Left vertebral artery is dominant. There is calcified plaque at the vertebral artery origins causing mild stenosis. Skeleton: Multilevel degenerative changes of the cervical spine. Other neck: No mass or adenopathy. Upper chest: Included upper lungs are clear. Review of the MIP images confirms the  above findings CTA HEAD FINDINGS Anterior circulation: Intracranial internal carotid arteries patent with calcified plaque. There is moderate to severe stenosis of the proximal supraclinoid portions bilaterally. Anterior cerebral arteries are patent. There is severe stenosis of the left A1 ACA. Middle cerebral arteries are patent with left M1 MCA atherosclerotic irregularity. Posterior circulation: Intracranial vertebral arteries are patent minimal calcified plaque. Basilar artery is patent with atherosclerotic irregularity and mild to moderate stenosis. Posterior cerebral arteries are patent atherosclerotic irregularity and mild stenosis. Venous sinuses: As permitted by contrast timing, patent. Corresponding to suspected abnormality on noncontrast CT, there remains a possible mass lesion along the left precentral gyrus. Review of the MIP images confirms the above findings CT Brain Perfusion Findings: CBF (<30%) Volume: 64mL Perfusion (Tmax>6.0s) volume: 54mL Mismatch Volume: 66mL  Infarction Location: None IMPRESSION: No large vessel occlusion. Plaque at the right ICA origin causes nearly 70% stenosis. There is short segment near occlusion of the proximal left ICA with reconstitution. Intracranial atherosclerosis including moderate to severe stenoses of the proximal supraclinoid ICAs and severe stenosis of the left A1 ACA. Perfusion imaging demonstrates no evidence of core infarction. There is calculated territory at risk in the left MCA territory and left MCA/PCA watershed. Persistent possible small mass lesion along the left precentral gyrus. MRI with contrast is recommended for further evaluation. These results were called by telephone at the time of interpretation on 12/31/2019 at 2:02 pm to provider [facility] , who verbally acknowledged these results. Electronically Signed   By: Macy Mis M.D.   On: 12/31/2019 14:17   CT CEREBRAL PERFUSION W CONTRAST  Result Date: 12/31/2019 CLINICAL DATA:  Code stroke  follow-up EXAM: CT ANGIOGRAPHY HEAD AND NECK CT PERFUSION BRAIN TECHNIQUE: Multidetector CT imaging of the head and neck was performed using the standard protocol during bolus administration of intravenous contrast. Multiplanar CT image reconstructions and MIPs were obtained to evaluate the vascular anatomy. Carotid stenosis measurements (when applicable) are obtained utilizing NASCET criteria, using the distal internal carotid diameter as the denominator. Multiphase CT imaging of the brain was performed following IV bolus contrast injection. Subsequent parametric perfusion maps were calculated using RAPID software. CONTRAST:  100 mL Omnipaque 350 COMPARISON:  None. FINDINGS: CTA NECK FINDINGS Aortic arch: Mild calcified plaque along the arch and at the patent great vessel origins. Right carotid system: Patent. Mild plaque the carotid. There is primarily calcified plaque at the ICA origin causing nearly 70% stenosis. Left carotid system: Patent. Mild plaque along the common carotid. There is mild calcified plaque at the ICA origin without high-grade stenosis. Just beyond the origin, there is additional primarily noncalcified plaque with resulting near occlusion over a short segment with subsequent reconstitution. Vertebral arteries: Patent. Left vertebral artery is dominant. There is calcified plaque at the vertebral artery origins causing mild stenosis. Skeleton: Multilevel degenerative changes of the cervical spine. Other neck: No mass or adenopathy. Upper chest: Included upper lungs are clear. Review of the MIP images confirms the above findings CTA HEAD FINDINGS Anterior circulation: Intracranial internal carotid arteries patent with calcified plaque. There is moderate to severe stenosis of the proximal supraclinoid portions bilaterally. Anterior cerebral arteries are patent. There is severe stenosis of the left A1 ACA. Middle cerebral arteries are patent with left M1 MCA atherosclerotic irregularity. Posterior  circulation: Intracranial vertebral arteries are patent minimal calcified plaque. Basilar artery is patent with atherosclerotic irregularity and mild to moderate stenosis. Posterior cerebral arteries are patent atherosclerotic irregularity and mild stenosis. Venous sinuses: As permitted by contrast timing, patent. Corresponding to suspected abnormality on noncontrast CT, there remains a possible mass lesion along the left precentral gyrus. Review of the MIP images confirms the above findings CT Brain Perfusion Findings: CBF (<30%) Volume: 21mL Perfusion (Tmax>6.0s) volume: 20mL Mismatch Volume: 27mL Infarction Location: None IMPRESSION: No large vessel occlusion. Plaque at the right ICA origin causes nearly 70% stenosis. There is short segment near occlusion of the proximal left ICA with reconstitution. Intracranial atherosclerosis including moderate to severe stenoses of the proximal supraclinoid ICAs and severe stenosis of the left A1 ACA. Perfusion imaging demonstrates no evidence of core infarction. There is calculated territory at risk in the left MCA territory and left MCA/PCA watershed. Persistent possible small mass lesion along the left precentral gyrus. MRI with contrast is recommended for further  evaluation. These results were called by telephone at the time of interpretation on 12/31/2019 at 2:02 pm to provider [facility] , who verbally acknowledged these results. Electronically Signed   By: Macy Mis M.D.   On: 12/31/2019 14:17   DG Abd Portable 1V  Result Date: 01/01/2020 CLINICAL DATA:  Altered mental status. Clearance for MRI EXAM: PORTABLE ABDOMEN - 1 VIEW COMPARISON:  None. FINDINGS: The bowel gas pattern is unremarkable. Vascular calcifications are noted. No significant bony findings. No metallic foreign bodies are identified. IMPRESSION: Unremarkable abdominal radiographs. Electronically Signed   By: Marijo Sanes M.D.   On: 01/01/2020 05:21   ECHOCARDIOGRAM COMPLETE  Result Date:  12/31/2019    ECHOCARDIOGRAM REPORT   Patient Name:   Robert Salinas. Date of Exam: 12/31/2019 Medical Rec #:  [id number]            Height:       70.0 in Accession #:    [id number]           Weight:       185.8 lb Date of Birth:  05/28/1927             BSA:          2.023 m Patient Age:    9 years             BP:           140/83 mmHg Patient Gender: M                    HR:           69 bpm. Exam Location:  Inpatient Procedure: 2D Echo Indications:    TIA  History:        Patient has no prior history of Echocardiogram examinations.                 Arrythmias:Atrial Fibrillation; Risk Factors:Diabetes,                 Hypertension and Dyslipidemia.  Sonographer:    Jannett Celestine RDCS (AE) Referring Phys: [id number] Lequita Halt  Sonographer Comments: restricted mobility. IMPRESSIONS  1. Left ventricular ejection fraction, by estimation, is 60 to 65%. The left ventricle has normal function. The left ventricle has no regional wall motion abnormalities. There is moderate concentric left ventricular hypertrophy. Left ventricular diastolic parameters are consistent with Grade I diastolic dysfunction (impaired relaxation). Elevated left ventricular end-diastolic pressure.  2. Right ventricular systolic function is normal. The right ventricular size is normal.  3. The mitral valve is normal in structure. No evidence of mitral valve regurgitation. No evidence of mitral stenosis.  4. The aortic valve is tricuspid. Aortic valve regurgitation is not visualized. No aortic stenosis is present.  5. Aortic dilatation noted. There is moderate dilatation at the level of the sinuses of Valsalva measuring 46 mm.  6. The inferior vena cava is normal in size with greater than 50% respiratory variability, suggesting right atrial pressure of 3 mmHg. FINDINGS  Left Ventricle: Left ventricular ejection fraction, by estimation, is 60 to 65%. The left ventricle has normal function. The left ventricle has no regional wall motion  abnormalities. The left ventricular internal cavity size was normal in size. There is  moderate concentric left ventricular hypertrophy. Left ventricular diastolic parameters are consistent with Grade I diastolic dysfunction (impaired relaxation). Elevated left ventricular end-diastolic pressure. Right Ventricle: The right ventricular size is normal. No increase in right ventricular wall thickness. Right  ventricular systolic function is normal. Left Atrium: Left atrial size was normal in size. Right Atrium: Right atrial size was normal in size. Pericardium: There is no evidence of pericardial effusion. Mitral Valve: The mitral valve is normal in structure. Normal mobility of the mitral valve leaflets. No evidence of mitral valve regurgitation. No evidence of mitral valve stenosis. Tricuspid Valve: The tricuspid valve is normal in structure. Tricuspid valve regurgitation is not demonstrated. No evidence of tricuspid stenosis. Aortic Valve: The aortic valve is tricuspid. Aortic valve regurgitation is not visualized. No aortic stenosis is present. Pulmonic Valve: The pulmonic valve was normal in structure. Pulmonic valve regurgitation is not visualized. No evidence of pulmonic stenosis. Aorta: Aortic dilatation noted. There is moderate dilatation at the level of the sinuses of Valsalva measuring 46 mm. Venous: The inferior vena cava is normal in size with greater than 50% respiratory variability, suggesting right atrial pressure of 3 mmHg. IAS/Shunts: No atrial level shunt detected by color flow Doppler.  LEFT VENTRICLE PLAX 2D LVIDd:         2.80 cm  Diastology LVIDs:         2.30 cm  LV e' lateral:   5.33 cm/s LV PW:         1.40 cm  LV E/e' lateral: 17.2 LV IVS:        1.40 cm  LV e' medial:    4.62 cm/s LVOT diam:     2.40 cm  LV E/e' medial:  19.9 LV SV:         72 LV SV Index:   36 LVOT Area:     4.52 cm  LEFT ATRIUM           Index LA diam:      3.80 cm 1.88 cm/m LA Vol (A2C): 39.2 ml 19.38 ml/m  AORTIC VALVE  LVOT Vmax:   70.80 cm/s LVOT Vmean:  55.500 cm/s LVOT VTI:    0.159 m  AORTA Ao Root diam: 4.10 cm MITRAL VALVE MV Area (PHT): 2.20 cm     SHUNTS MV Decel Time: 345 msec     Systemic VTI:  0.16 m MV E velocity: 91.90 cm/s   Systemic Diam: 2.40 cm MV A velocity: 115.00 cm/s MV E/A ratio:  0.80 Skeet Latch MD Electronically signed by Skeet Latch MD Signature Date/Time: 12/31/2019/5:08:30 PM    Final    CT HEAD CODE STROKE WO CONTRAST  Addendum Date: 12/31/2019   ADDENDUM REPORT: 12/31/2019 13:50 ADDENDUM: Question of a subcentimeter peripherally hyperdense lesion along the left precentral gyrus (series 3, image 22). This can be further evaluated on upcoming CTA. Electronically Signed   By: Macy Mis M.D.   On: 12/31/2019 13:50   Result Date: 12/31/2019 CLINICAL DATA:  Code stroke. EXAM: CT HEAD WITHOUT CONTRAST TECHNIQUE: Contiguous axial images were obtained from the base of the skull through the vertex without intravenous contrast. COMPARISON:  06/25/2019 FINDINGS: Brain: There is no acute intracranial hemorrhage, mass effect, or edema. No new loss of gray-white differentiation. Prominence of the ventricles and sulci reflects stable generalized parenchymal volume loss. Patchy and confluent areas of hypoattenuation in the supratentorial white matter are nonspecific but probably reflect stable advanced chronic microvascular ischemic changes. Vascular: No hyperdense vessel. There is intracranial atherosclerotic calcification at skull base. Skull: Unremarkable. Sinuses/Orbits: Patchy mucosal thickening. Bilateral lens replacements. Other: Mastoid air cells are clear. ASPECTS [facility] Stroke Program Early CT Score) - Ganglionic level infarction (caudate, lentiform nuclei, internal capsule, insula, M1-M3 cortex): 7 -  Supraganglionic infarction (M4-M6 cortex): 3 Total score (0-10 with 10 being normal): 10 IMPRESSION: No evidence of acute infarction or intracranial hemorrhage. ASPECT score is 10. Stable  chronic findings detailed above. These results were communicated to Dr. Erlinda Hong At 1:39 pmon 7/14/2021by text page via the [facility] messaging system. Electronically Signed: By: Macy Mis M.D. On: 12/31/2019 13:40        Scheduled Meds: .  stroke: mapping our early stages of recovery book   Does not apply Once  . ammonium lactate  1 application Topical QHS  . aspirin  300 mg Rectal Daily   Or  . aspirin EC  325 mg Oral Daily  . atorvastatin  10 mg Oral Daily  . donepezil  10 mg Oral QHS  . heparin  5,000 Units Subcutaneous Q12H  . insulin aspart  0-9 Units Subcutaneous TID WC  . lactose free nutrition  237 mL Oral BID BM  . levothyroxine  44 mcg Intravenous Daily   Continuous Infusions:    LOS: 2 days     Georgette Shell, MD 01/02/2020, 11:11 AM

## 2020-01-02 NOTE — Evaluation (Signed)
Occupational Therapy Evaluation Patient Details Name: Robert Salinas. MRN: [id number] DOB: 10/26/1926 Today's Date: 01/02/2020    History of Present Illness 86 yo male admitted with aphasia. MRI pending  PMH alzheimers essential HTN Afib CKD DM2   Clinical Impression   PT admitted with aphasia. Pt currently with functional limitiations due to the deficits listed below (see OT problem list). pt is HOH and responds best on the R side into his R ear. Pt speaking back to therapist and ask several times "where do you work?" and "whatcha bout to do?". Pt responding well to warm room and blanket prior to movement to keep him from being cold. Pt with warmth responds to request to move appropriately.  Pt will benefit from skilled OT to increase their independence and safety with adls and balance to allow discharge SNF.     Follow Up Recommendations  SNF    Equipment Recommendations  None recommended by OT    Recommendations for Other Services       Precautions / Restrictions Precautions Precautions: Fall Precaution Comments: mittens at this time      Mobility Bed Mobility Overal bed mobility: Needs Assistance Bed Mobility: Supine to Sit;Sit to Supine     Supine to sit: +2 for physical assistance;Max assist Sit to supine: +2 for physical assistance;Max assist   General bed mobility comments: pt requires (A) to elevate from bed surface and sustain EOB. pt falling over to the L . pt with increased time using L UE to push himself up and static sit. Pt does make request to lay back down appropriately without initating  Transfers Overall transfer level: Needs assistance Equipment used: 2 person hand held assist Transfers: Sit to/from Stand Sit to Stand: +2 physical assistance;Mod assist;From elevated surface         General transfer comment: pt with bed elevated to help with initial transfer onto a RW. pt able to state side steps toward [facility]. pt able to static stand with (A) for  bed linen to be changed.     Balance Overall balance assessment: Needs assistance Sitting-balance support: Single extremity supported;Feet supported Sitting balance-Leahy Scale: Fair   Postural control: Left lateral lean Standing balance support: Bilateral upper extremity supported;During functional activity Standing balance-Leahy Scale: Poor Standing balance comment: reliance on therapist (A)                           ADL either performed or assessed with clinical judgement   ADL Overall ADL's : Needs assistance/impaired Eating/Feeding: Minimal assistance Eating/Feeding Details (indicate cue type and reason): drinking water from a straw                                   General ADL Comments: pt is incontinent in the bed reports he is cold unaware of wet linen. pt provided x2 warm blankets and able to sit patient at EOB then progress to standing. the temperature in the room was increased to 80 to warm the room quickly and reduced to 75 after session     Vision         Perception     Praxis      Pertinent Vitals/Pain Pain Assessment: No/denies pain     Hand Dominance Right   Extremity/Trunk Assessment Upper Extremity Assessment Upper Extremity Assessment: Generalized weakness   Lower Extremity Assessment Lower Extremity Assessment: Generalized weakness  Cervical / Trunk Assessment Cervical / Trunk Assessment: Kyphotic   Communication Communication Communication: HOH (speak to R ear)   Cognition Arousal/Alertness: Awake/alert Behavior During Therapy: Flat affect Overall Cognitive Status: History of cognitive impairments - at baseline                                 General Comments: pt repeating himself. pt when provided information in R ear states "alright" and agreeable   General Comments       Exercises     Shoulder Instructions      Home Living Family/patient expects to be discharged to:: Skilled nursing  facility     Type of Home: [place]                           Additional Comments: [facility]. Pt could return      Prior Functioning/Environment Level of Independence: Needs assistance                 OT Problem List: Decreased strength;Decreased activity tolerance;Impaired balance (sitting and/or standing);Decreased cognition;Decreased safety awareness;Decreased knowledge of use of DME or AE;Decreased knowledge of precautions      OT Treatment/Interventions: Self-care/ADL training;Therapeutic exercise;Neuromuscular education;DME and/or AE instruction;Therapeutic activities;Cognitive remediation/compensation;Patient/family education;Balance training    OT Goals(Current goals can be found in the care plan section) Acute Rehab OT Goals Patient Stated Goal: to lay down with warm blanket OT Goal Formulation: Patient unable to participate in goal setting Time For Goal Achievement: 01/16/20 Potential to Achieve Goals: Good  OT Frequency: Min 2X/week   Barriers to D/C:            Co-evaluation PT/OT/SLP Co-Evaluation/Treatment: Yes Reason for Co-Treatment: For patient/therapist safety;To address functional/ADL transfers   OT goals addressed during session: ADL's and self-care;Proper use of Adaptive equipment and DME;Strengthening/ROM      AM-PAC OT "6 Clicks" Daily Activity     Outcome Measure Help from another person eating meals?: A Little Help from another person taking care of personal grooming?: A Little Help from another person toileting, which includes using toliet, bedpan, or urinal?: A Little Help from another person bathing (including washing, rinsing, drying)?: A Lot Help from another person to put on and taking off regular upper body clothing?: A Little Help from another person to put on and taking off regular lower body clothing?: A Lot 6 Click Score: 16   End of Session Equipment  Utilized During Treatment: Rolling walker Nurse Communication: Mobility status;Precautions  Activity Tolerance: Patient tolerated treatment well Patient left: in bed;with call bell/phone within reach;with chair alarm set;with restraints reapplied  OT Visit Diagnosis: Unsteadiness on feet (R26.81);Muscle weakness (generalized) (M62.81)                Time: 9407-6808 OT Time Calculation (min): 17 min Charges:  OT General Charges $OT Visit: 1 Visit OT Evaluation $OT Eval Moderate Complexity: 1 Mod   Brynn, OTR/L  Acute Rehabilitation Services Pager: [phone] Office: [phone] .   Jeri Modena 01/02/2020, 2:24 PM

## 2020-01-02 NOTE — Evaluation (Signed)
Clinical/Bedside Swallow Evaluation Patient Details  Name: Robert Salinas. MRN: [id number] Date of Birth: 19-Nov-1926  Today's Date: 01/02/2020 Time: SLP Start Time (ACUTE ONLY): 6237 SLP Stop Time (ACUTE ONLY): 0845 SLP Time Calculation (min) (ACUTE ONLY): 10 min  Past Medical History:  Past Medical History:  Diagnosis Date  . Alzheimer disease ([place])   . Atrial fibrillation ([place])   . BPH (benign prostatic hyperplasia)   . Chickenpox   . Chronic kidney disease   . COLONIC POLYPS, HX OF 06/09/2009  . Dementia ([place])   . Diabetes mellitus   . Hx of colonic polyps   . Hyperlipidemia   . Hypertension   . Hypothyroidism   . Hypothyroidism   . Thrombocytopenia ([place])    Past Surgical History:  Past Surgical History:  Procedure Laterality Date  . APPENDECTOMY    . PROSTATECTOMY     BPH cause?   HPI:  86 year old male admitted from nursing home with change in mental status.  He has history of Alzheimer's dementia hypothyroidism paroxysmal atrial fibrillation type 2 diabetes and hypertension admitted with aphasia.  Patient suddenly became nonverbal and unresponsive at the nursing home.  CT negative, MRI pending.    Assessment / Plan / Recommendation Clinical Impression  Pt demonstrates ability to attend to PO, no signs of oral dysphagia or aspiration. Pt able to masticate solids despite some missing dentition. Will initaite a regular diet and thin liquids. No SLP f/u needed for swallowing.  SLP Visit Diagnosis: Dysphagia, unspecified (R13.10)    Aspiration Risk  Mild aspiration risk    Diet Recommendation Regular;Thin liquid   Liquid Administration via: Cup;Straw Medication Administration: Whole meds with liquid Supervision: Staff to assist with self feeding Compensations: Slow rate;Small sips/bites Postural Changes: Seated upright at 90 degrees    Other  Recommendations Oral Care Recommendations: Oral care BID   Follow up Recommendations        Frequency and Duration             Prognosis        Swallow Study   General HPI: 86 year old male admitted from nursing home with change in mental status.  He has history of Alzheimer's dementia hypothyroidism paroxysmal atrial fibrillation type 2 diabetes and hypertension admitted with aphasia.  Patient suddenly became nonverbal and unresponsive at the nursing home.  CT negative, MRI pending.  Type of Study: Bedside Swallow Evaluation Diet Prior to this Study: NPO Temperature Spikes Noted: No Respiratory Status: Room air History of Recent Intubation: No Behavior/Cognition: Alert;Cooperative;Pleasant mood Oral Cavity Assessment: Within Functional Limits Oral Care Completed by SLP: No Oral Cavity - Dentition:  (no top dentition) Vision: Functional for self-feeding Self-Feeding Abilities: Needs assist Patient Positioning: Upright in bed Baseline Vocal Quality: Normal Volitional Cough: Cognitively unable to elicit Volitional Swallow: Able to elicit    Oral/Motor/Sensory Function Overall Oral Motor/Sensory Function: Other (comment) (doesnt follow commands, no weakness)   Ice Chips Ice chips: Within functional limits   Thin Liquid Thin Liquid: Within functional limits Presentation: Cup;Straw    Nectar Thick Nectar Thick Liquid: Not tested   Honey Thick Honey Thick Liquid: Not tested   Puree Puree: Within functional limits   Solid     Solid: Within functional limits      Lord Lancour, Katherene Ponto 01/02/2020,12:33 PM

## 2020-01-03 ENCOUNTER — Inpatient Hospital Stay (HOSPITAL_COMMUNITY): Payer: Medicare Other

## 2020-01-03 DIAGNOSIS — R4182 Altered mental status, unspecified: Secondary | ICD-10-CM

## 2020-01-03 LAB — COMPREHENSIVE METABOLIC PANEL
ALT: 16 U/L (ref 0–44)
AST: 21 U/L (ref 15–41)
Albumin: 3.2 g/dL — ABNORMAL LOW (ref 3.5–5.0)
Alkaline Phosphatase: 116 U/L (ref 38–126)
Anion gap: 10 (ref 5–15)
BUN: 29 mg/dL — ABNORMAL HIGH (ref 8–23)
CO2: 24 mmol/L (ref 22–32)
Calcium: 9 mg/dL (ref 8.9–10.3)
Chloride: 106 mmol/L (ref 98–111)
Creatinine, Ser: 1.67 mg/dL — ABNORMAL HIGH (ref 0.61–1.24)
GFR calc Af Amer: 41 mL/min — ABNORMAL LOW (ref >60)
GFR calc non Af Amer: 35 mL/min — ABNORMAL LOW (ref >60)
Glucose, Bld: 243 mg/dL — ABNORMAL HIGH (ref 70–99)
Potassium: 4.1 mmol/L (ref 3.5–5.1)
Sodium: 140 mmol/L (ref 135–145)
Total Bilirubin: 0.6 mg/dL (ref 0.3–1.2)
Total Protein: 6.2 g/dL — ABNORMAL LOW (ref 6.5–8.1)

## 2020-01-03 LAB — CBC
HCT: 38 % — ABNORMAL LOW (ref 39.0–52.0)
Hemoglobin: 12.1 g/dL — ABNORMAL LOW (ref 13.0–17.0)
MCH: 29.7 pg (ref 26.0–34.0)
MCHC: 31.8 g/dL (ref 30.0–36.0)
MCV: 93.4 fL (ref 80.0–100.0)
Platelets: 176 10*3/uL (ref 150–400)
RBC: 4.07 MIL/uL — ABNORMAL LOW (ref 4.22–5.81)
RDW: 13.8 % (ref 11.5–15.5)
WBC: 5.3 10*3/uL (ref 4.0–10.5)
nRBC: 0 % (ref 0.0–0.2)

## 2020-01-03 LAB — GLUCOSE, CAPILLARY
Glucose-Capillary: 95 mg/dL (ref 70–99)
Glucose-Capillary: 99 mg/dL (ref 70–99)
Glucose-Capillary: 214 mg/dL — ABNORMAL HIGH (ref 70–99)
Glucose-Capillary: 335 mg/dL — ABNORMAL HIGH (ref 70–99)
Glucose-Capillary: 96 mg/dL (ref 70–99)

## 2020-01-03 MED ORDER — IOHEXOL 300 MG/ML  SOLN
60.0000 mL | Freq: Once | INTRAMUSCULAR | Status: AC | PRN
  Administered 2020-01-03: 60 mL via INTRAVENOUS

## 2020-01-03 MED ORDER — ACETAMINOPHEN 325 MG PO TABS: 650.0000 mg | ORAL_TABLET | ORAL | Status: AC | PRN

## 2020-01-03 NOTE — Progress Notes (Signed)
Patient is not allowing tech to apply leads to head.  Tech will try later.

## 2020-01-03 NOTE — Procedures (Signed)
Patient Name: Saahas Hidrogo.  MRN: [id number]  Epilepsy Attending: Lora Havens  Referring Physician/Provider: Dr Antony Contras Date: 01/04/2020 Duration: 26.11 mins  Patient history: 86 y.o. male with PMH of alzheimer's dementia, hypothyroidism, remote AFib on ASA, well-controlled T2DM, and HTN presenting to ED for aphasia and altered mental status. EEG to evaluate for seizure.  Level of alertness: Awake,  asleep  AEDs during EEG study: None  Technical aspects: This EEG study was done with scalp electrodes positioned according to the 10-20 International system of electrode placement. Electrical activity was acquired at a sampling rate of 500Hz  and reviewed with a high frequency filter of 70Hz  and a low frequency filter of 1Hz . EEG data were recorded continuously and digitally stored.   Description: No posterior dominant rhythm. Sleep was characterized by vertex waves, maximal frontocentral region.  EEG showed continuous generalized 3 to 6 Hz theta-delta slowing. Hyperventilation and photic stimulation were not performed.     ABNORMALITY -Continuous slow, generalized  IMPRESSION: This study is suggestive of moderate diffuse encephalopathy, nonspecific etiology. No seizures or epileptiform discharges were seen throughout the recording.  Margo Lama Barbra Sarks

## 2020-01-03 NOTE — TOC Progression Note (Addendum)
Transition of Care (TOC) - Progression Note    Patient Details  Name: Robert Salinas. MRN: [id number] Date of Birth: 1926/09/28  Transition of Care [facility]) CM/SW [place], [place] Phone Number: 01/03/2020, 2:08 PM  Clinical Narrative:    5:20p CSW spoke with [place] of [employer] on patient discharging Sunday. [place] requested patient discharge Monday to ensure he has everything needed to return to facility. CSW faxed over preliminary discharge summary and fl2 for review.   4:40p CSW contacted [employer] and spoke with a nurse tech regarding patient returning to the facility Sunday. Nurse tech stated she would have [place] contact [facility] to confirm patient's discharge.   2:08p CSW contacted [facility] to inquire on patient discharging back to the facility. CSW was transferred to the Memory Care unit and unable to speak with anyone. CSW attempted another phone call to the memory care unit [phone] and the call went unaswered. CSW will continue to try to make contact for discharge planning.   Expected Discharge Plan: Memory Care Barriers to Discharge: Continued Medical Work up  Expected Discharge Plan and Services Expected Discharge Plan: Memory Care     Post Acute Care Choice: Home Health, Nursing Home Living arrangements for the past 2 months: Assisted Living Facility                           HH Arranged: RN, PT, OT [facility] Agency: [facility] [employer] Date [place]: 01/02/20   Representative spoke with at [place]: [place] ([place]) Interventions    Readmission Risk Interventions No flowsheet data found.

## 2020-01-03 NOTE — Progress Notes (Addendum)
PROGRESS NOTE    Robert Salinas.  EQA:[id number] DOB: 1927/06/02 DOA: 12/31/2019 PCP: Patient, No Pcp Per    Brief Narrative: 86 year old male admitted from nursing home with change in mental status.he has history of Alzheimer's dementia hypothyroidism paroxysmal atrial fibrillation type 2 diabetes and hypertension admitted with aphasia.  Patient suddenly became nonverbal and unresponsive at the nursing home.  Admitted for stroke work-up.  Assessment & Plan:   Active Problems:   Cerebral embolism with cerebral infarction   TIA (transient ischemic attack)   #1 left MCA infarct-work-up shows CT angiogram of the head and neck with 70% right ICA origin proximal left ICA near occlusion severe supraclinoid ICA stenosis possible small  mass in the precentral gyrus CT head showed no acute stroke Echo normal ejection fraction LDL 122 Hemoglobin A1c 7.8 PT OT recommending SNF. Speech therapy has started him on a diet.  Unable to do CT scan until today. MRI not done as patient was not able to lay still. EEG ordered.  Addendum ct head today with likely mass left 11/12 mm.dw martha DIL wants no aggressive work up .Marland Kitchen  #2 CKD stage III creatinine 1.56 down from 1.80 on admission.  Patient became very agitated pulled out the IV out along with telemetry monitors.  Continue to encourage p.o. intake and DC IV fluids.  #3 dysphagia due to stroke consulted speech therapy started him on a diet.  #4 history of paroxysmal atrial fibrillation not on anticoagulation at the nursing home due to risk of falls. Rate is controlled.  #5 type 2 diabetes with hyperglycemia hemoglobin A1c is 7.8.  On SSI.  Was on Metformin prior to admission.  #6 hypothyroidism on Synthroid  #7 history of Alzheimer's dementia on Aricept  #8 history of essential hypertension-blood pressure 158/79. Was on Norvasc prior to admission.   Estimated body mass index is 23.45 kg/m as calculated from the following:   Height as  of this encounter: 5\' 10"  (1.778 m).   Weight as of this encounter: 74.1 kg.  DVT prophylaxis: Heparin subcu twice daily Code Status: DNR Family Communication: dw daughter  Disposition Plan:  Status is: Inpatient   Dispo: The patient is from: SNF              Anticipated d/c is to: SNF              Anticipated d/c date is: 1day              Patient currently is not medically stable to d/c.  Patient admitted for acute stroke with ongoing work-up and pending physical and occupational therapy. Will dc back to [facility] today. Consultants:   Neurology  Procedures: None Antimicrobials: None Subjective: Patient restless in bed moving all extremities staff concerned about him not able to swallow liquids asking for a repeat swallow evaluation. Objective: Vitals:   01/02/20 1534 01/02/20 2038 01/03/20 0447 01/03/20 0820  BP: (!) 152/74 (!) 168/81 139/76 (!) 158/79  Pulse: 74 66 68 71  Resp: 20 18 18 15   Temp: (!) 97.1 F (36.2 C) 98.3 F (36.8 C) 97.6 F (36.4 C) 97.8 F (36.6 C)  TempSrc: Axillary Oral Oral Oral  SpO2: 100% 100% 96% 99%  Weight:      Height:        Intake/Output Summary (Last 24 hours) at 01/03/2020 1206 Last data filed at 01/02/2020 1605 Gross per 24 hour  Intake 240 ml  Output --  Net 240 ml   [employer]  12/31/19 1418 01/02/20 0000  Weight: 84.3 kg 74.1 kg    Examination:  General exam: Appears calm and comfortable  Respiratory system: Clear to auscultation. Respiratory effort normal. Cardiovascular system: S1 & S2 heard, RRR. No JVD, murmurs, rubs, gallops or clicks. No pedal edema. Gastrointestinal system: Abdomen is nondistended, soft and nontender. No organomegaly or masses felt. Normal bowel sounds heard. Central nervous system: Confused does not answer any questions appropriately or follow any commands  extremities no edema Skin: No rashes, lesions or ulcers Psychiatry: Unable to assess.   Data Reviewed: I have personally reviewed  following labs and imaging studies  CBC: Recent Labs  Lab 12/31/19 1325 12/31/19 1331  WBC 4.6  --   NEUTROABS 3.1  --   HGB 11.2* 11.2*  HCT 35.4* 33.0*  MCV 93.7  --   PLT 171  --    Basic Metabolic Panel: Recent Labs  Lab 12/31/19 1325 12/31/19 1331 01/01/20 0449  NA 141 142 143  K 5.0 4.9 4.3  CL 108 107 109  CO2 23  --  24  GLUCOSE 307* 297* 121*  BUN 35* 37* 28*  CREATININE 1.69* 1.80* 1.56*  CALCIUM 8.8*  --  8.7*   GFR: Estimated Creatinine Clearance: 32.5 mL/min (A) (by C-G formula based on SCr of 1.56 mg/dL (H)). Liver Function Tests: Recent Labs  Lab 12/31/19 1325  AST 24  ALT 21  ALKPHOS 98  BILITOT 0.7  PROT 6.1*  ALBUMIN 3.2*   No results for input(s): LIPASE, AMYLASE in the last 168 hours. No results for input(s): AMMONIA in the last 168 hours. Coagulation Profile: Recent Labs  Lab 12/31/19 1325  INR 1.0   Cardiac Enzymes: No results for input(s): CKTOTAL, CKMB, CKMBINDEX, TROPONINI in the last 168 hours. BNP (last 3 results) No results for input(s): PROBNP in the last 8760 hours. HbA1C: Recent Labs    01/01/20 0449  HGBA1C 7.8*   CBG: Recent Labs  Lab 01/02/20 1126 01/02/20 1603 01/02/20 2113 01/03/20 0630 01/03/20 0819  GLUCAP 84 137* 123* 95 99   Lipid Profile: Recent Labs    01/01/20 0449  CHOL 184  HDL 44  LDLCALC 122*  TRIG 90  CHOLHDL 4.2   Thyroid Function Tests: No results for input(s): TSH, T4TOTAL, FREET4, T3FREE, THYROIDAB in the last 72 hours. Anemia Panel: No results for input(s): VITAMINB12, FOLATE, FERRITIN, TIBC, IRON, RETICCTPCT in the last 72 hours. Sepsis Labs: No results for input(s): PROCALCITON, LATICACIDVEN in the last 168 hours.  Recent Results (from the past 240 hour(s))  SARS Coronavirus 2 by RT PCR (hospital order, performed in [facility] hospital lab) Nasopharyngeal Nasopharyngeal Swab     Status: None   Collection Time: 12/31/19  2:29 PM   Specimen: Nasopharyngeal Swab  Result  Value Ref Range Status   SARS Coronavirus 2 NEGATIVE NEGATIVE Final    Comment: (NOTE) SARS-CoV-2 target nucleic acids are NOT DETECTED.  The SARS-CoV-2 RNA is generally detectable in upper and lower respiratory specimens during the acute phase of infection. The lowest concentration of SARS-CoV-2 viral copies this assay can detect is 250 copies / mL. A negative result does not preclude SARS-CoV-2 infection and should not be used as the sole basis for treatment or other patient management decisions.  A negative result may occur with improper specimen collection / handling, submission of specimen other than nasopharyngeal swab, presence of viral mutation(s) within the areas targeted by this assay, and inadequate number of viral copies (<250 copies / mL). A  negative result must be combined with clinical observations, patient history, and epidemiological information.  Fact Sheet for Patients:   StrictlyIdeas.no  Fact Sheet for Healthcare Providers: BankingDealers.co.za  This test is not yet approved or  cleared by the [place] FDA and has been authorized for detection and/or diagnosis of SARS-CoV-2 by FDA under an Emergency Use Authorization (EUA).  This EUA will remain in effect (meaning this test can be used) for the duration of the COVID-19 declaration under Section 564(b)(1) of the Act, 21 U.S.C. section 360bbb-3(b)(1), unless the authorization is terminated or revoked sooner.  Performed at [facility], [place] [address] [place], [place] [address]   MRSA PCR Screening     Status: None   Collection Time: 01/01/20  9:25 PM   Specimen: Nasal Mucosa; Nasopharyngeal  Result Value Ref Range Status   MRSA by PCR NEGATIVE NEGATIVE Final    Comment:        The GeneXpert MRSA Assay (FDA approved for NASAL specimens only), is one component of a comprehensive MRSA colonization surveillance program. It is not intended to diagnose  MRSA infection nor to guide or monitor treatment for MRSA infections. Performed at [facility], [place] [address], [place], [place] [address]          Radiology Studies: CT HEAD W & WO CONTRAST  Result Date: 01/03/2020 CLINICAL DATA:  Ataxia.  Stroke.  Mass lesion identified on CT. EXAM: CT HEAD WITHOUT AND WITH CONTRAST TECHNIQUE: Contiguous axial images were obtained from the base of the skull through the vertex without and with intravenous contrast CONTRAST:  87mL OMNIPAQUE IOHEXOL 300 MG/ML  SOLN COMPARISON:  CT head and CTA head 12/31/2019 FINDINGS: Brain: Advanced atrophy. Extensive chronic microvascular ischemic changes in the white matter bilaterally. No acute cortical infarct. No acute hemorrhage 11 x 12 mm enhancing mass lesion in the left prefrontal cortex. This appears intra-axial and has central necrosis. Probable metastatic deposit. No other enhancing lesions identified. Vascular: Negative for hyperdense vessel. Atherosclerotic calcification in the cavernous carotid bilaterally. Normal venous enhancement. Skull: Negative Sinuses/Orbits: Depressed fracture left orbital floor. This was not present on the CT of 06/25/2019. Correlate with symptoms of recent injury. Bilateral cataract extraction. Mild mucosal edema paranasal sinuses. Other: None IMPRESSION: 11 x 12 mm enhancing mass left prefrontal cortex compatible with metastatic disease. Extensive atrophy. Extensive chronic microvascular ischemic change in the white matter Depressed fracture left orbital floor. Correlate with symptoms and injury. Electronically Signed   By: Franchot Gallo M.D.   On: 01/03/2020 10:13        Scheduled Meds: .  stroke: mapping our early stages of recovery book   Does not apply Once  . ammonium lactate  1 application Topical QHS  . aspirin  300 mg Rectal Daily   Or  . aspirin EC  325 mg Oral Daily  . atorvastatin  10 mg Oral Daily  . donepezil  10 mg Oral QHS  . heparin  5,000 Units  Subcutaneous Q12H  . insulin aspart  0-9 Units Subcutaneous TID WC  . lactose free nutrition  237 mL Oral BID BM  . levothyroxine  88 mcg Oral Q0600   Continuous Infusions:    LOS: 3 days     Georgette Shell, MD 01/03/2020, 12:06 PM

## 2020-01-03 NOTE — Evaluation (Signed)
Clinical/Bedside Swallow Evaluation Patient Details  Name: Robert Salinas. MRN: [id number] Date of Birth: February 06, 1927  Today's Date: 01/03/2020 Time: SLP Start Time (ACUTE ONLY): 1109 SLP Stop Time (ACUTE ONLY): 1123 SLP Time Calculation (min) (ACUTE ONLY): 14 min  Past Medical History:  Past Medical History:  Diagnosis Date  . Alzheimer disease ([place])   . Atrial fibrillation ([place])   . BPH (benign prostatic hyperplasia)   . Chickenpox   . Chronic kidney disease   . COLONIC POLYPS, HX OF 06/09/2009  . Dementia ([place])   . Diabetes mellitus   . Hx of colonic polyps   . Hyperlipidemia   . Hypertension   . Hypothyroidism   . Hypothyroidism   . Thrombocytopenia ([place])    Past Surgical History:  Past Surgical History:  Procedure Laterality Date  . APPENDECTOMY    . PROSTATECTOMY     BPH cause?   HPI:  86 year old male admitted from nursing home with change in mental status.  He has history of Alzheimer's dementia hypothyroidism paroxysmal atrial fibrillation type 2 diabetes and hypertension admitted with aphasia.  Patient suddenly became nonverbal and unresponsive at the nursing home.  CT negative, MRI pending. Initial swallow evaluation 7/16 [facility] but nursing reported coughing wtih thin liquids overnight so SLP was reordered.    Assessment / Plan / Recommendation Clinical Impression  Nursing reported coughing overnight with thin liquids, but this is not observed this morning during trials with SLP. Pt's oropharyngeal swallow appears to be functional without overt s/s of aspiration, with presentation more similar to initial swallow evaluation on previous date. Questions if difficulty overnight could have been related to any change in mentation (lethargy vs increased confusion with pt also documented to be more anxious/restless throughout the night). Given his overall cognitive status it is possible for him to have a more fluctuating performance. Will continue current diet with use of  aspiration precautions, but will f/u briefly to ensure ongoing tolerance in light of difficulties overnight.    SLP Visit Diagnosis: Dysphagia, unspecified (R13.10)    Aspiration Risk  Mild aspiration risk    Diet Recommendation Regular;Thin liquid   Liquid Administration via: Cup;Straw Medication Administration: Whole meds with puree Supervision: Staff to assist with self feeding Compensations: Slow rate;Small sips/bites Postural Changes: Seated upright at 90 degrees    Other  Recommendations Oral Care Recommendations: Oral care BID   Follow up Recommendations Skilled Nursing facility      Frequency and Duration min 1 x/week  1 week       Prognosis Prognosis for Safe Diet Advancement: Good      Swallow Study   General HPI: 86 year old male admitted from nursing home with change in mental status.  He has history of Alzheimer's dementia hypothyroidism paroxysmal atrial fibrillation type 2 diabetes and hypertension admitted with aphasia.  Patient suddenly became nonverbal and unresponsive at the nursing home.  CT negative, MRI pending. Initial swallow evaluation 7/16 [facility] but nursing reported coughing wtih thin liquids overnight so SLP was reordered.  Type of Study: Bedside Swallow Evaluation Previous Swallow Assessment: see HPI Diet Prior to this Study: Regular;Thin liquids Temperature Spikes Noted: No Respiratory Status: Room air History of Recent Intubation: No Behavior/Cognition: Alert;Cooperative;Pleasant mood Oral Cavity Assessment: Within Functional Limits Oral Care Completed by SLP: No Oral Cavity - Dentition:  (no top dentition) Vision: Functional for self-feeding Self-Feeding Abilities: Able to feed self Patient Positioning: Upright in bed Baseline Vocal Quality: Normal Volitional Cough: Cognitively unable to elicit Volitional Swallow: Unable to  elicit    Oral/Motor/Sensory Function     Ice Chips Ice chips: Not tested   Thin Liquid Thin Liquid: Within  functional limits Presentation: Cup;Straw    Nectar Thick Nectar Thick Liquid: Not tested   Honey Thick Honey Thick Liquid: Not tested   Puree Puree: Within functional limits Presentation: Self Fed;Spoon   Solid     Solid: Within functional limits Presentation: Self Fed      Osie Bond., M.A. [place] Pager [phone] Office [phone]  01/03/2020,12:06 PM

## 2020-01-03 NOTE — Progress Notes (Addendum)
STROKE TEAM PROGRESS NOTE   INTERVAL HISTORY Pt sitting in bed, awake alert, he remains aphasic with some word salad and fluent speech but not able to name, repeat.  Follows only simple midline and one-step commands.  CT scan with contrast this morning shows 11 x 12 mm left frontal enhancing lesion likely brain metastasis.  Vitals:   01/02/20 1124 01/02/20 1534 01/02/20 2038 01/03/20 0447  BP: 122/63 (!) 152/74 (!) 168/81 139/76  Pulse: 82 74 66 68  Resp: 16 20 18 18   Temp: (!) 96.9 F (36.1 C) (!) 97.1 F (36.2 C) 98.3 F (36.8 C) 97.6 F (36.4 C)  TempSrc: Axillary Axillary Oral Oral  SpO2: 96% 100% 100% 96%  Weight:      Height:       CBC:  Recent Labs  Lab 12/31/19 1325 12/31/19 1331  WBC 4.6  --   NEUTROABS 3.1  --   HGB 11.2* 11.2*  HCT 35.4* 33.0*  MCV 93.7  --   PLT 171  --    Basic Metabolic Panel:  Recent Labs  Lab 12/31/19 1325 12/31/19 1325 12/31/19 1331 01/01/20 0449  NA 141   < > 142 143  K 5.0   < > 4.9 4.3  CL 108   < > 107 109  CO2 23  --   --  24  GLUCOSE 307*   < > 297* 121*  BUN 35*   < > 37* 28*  CREATININE 1.69*   < > 1.80* 1.56*  CALCIUM 8.8*  --   --  8.7*   < > = values in this interval not displayed.   Lipid Panel:  Recent Labs  Lab 01/01/20 0449  CHOL 184  TRIG 90  HDL 44  CHOLHDL 4.2  VLDL 18  LDLCALC 122*   HgbA1c:  Recent Labs  Lab 01/01/20 0449  HGBA1C 7.8*   Urine Drug Screen: No results for input(s): LABOPIA, COCAINSCRNUR, LABBENZ, AMPHETMU, THCU, LABBARB in the last 168 hours.  Alcohol Level No results for input(s): ETH in the last 168 hours.  IMAGING past 24 hours No results found.  PHYSICAL EXAM  Temp:  [96.9 F (36.1 C)-98.3 F (36.8 C)] 97.6 F (36.4 C) (07/17 0447) Pulse Rate:  [66-82] 68 (07/17 0447) Resp:  [16-20] 18 (07/17 0447) BP: (122-168)/(63-87) 139/76 (07/17 0447) SpO2:  [96 %-100 %] 96 % (07/17 0447)  General - mildly cachectic, well developed, in no acute distress.    Ophthalmologic  - fundi not visualized due to noncooperation.    Cardiovascular - regular rhythm and rate  Neuro - awake, alert, eyes open, receptive aphasia with words salad, talkative but not make sense. Not cooperative on naming or repetition, hard of  Hearing. Tracking with eyes bilaterally, no gaze palsy, inconsistently blinking to visual threat bilaterally.  No significant facial droop.  Tongue protrusion not corporative.  Bilateral upper extremity at least 4/5, symmetrical.  Bilateral lower extremity at least 4/5, symmetrical. Sensation, coordination not cooperative and gait not tested.   ASSESSMENT/PLAN [name]. is a 86 y.o. male with history of f alzheimer's dementia, hypothyroidism, remote AFib on ASA, well-controlled T2DM, and HTN presenting with aphasia and altered mental status. Plans to give tPA squelched when CT showed possible small L frontotemporal brain mass.    Altered mental status and confusion with CT scan x2 - for stroke but showing left frontal metastasis raising concern for possible seizure as the cause of his confusion.    Code Stroke  CT head No acute abnormality. Possible small hyperdense L precentral gyrus mass. ASPECTS 10.     CTA head & neck no LVO.  R ICA origin 70%. Proximal L ICA short near occlusion. Proximal supraclinoid ICA moderate to severe.  L A1 severe stenosis. Possible small mass L precentral gyrus.   CT perfusion no core. L MCA, L MCA/PCA watershed territory at risk  MRI w/w/o not cooperative  CT w/wo pending  2D Echo EF 60-65%. No source of embolus   LDL 122  HgbA1c 7.8  VTE prophylaxis - Heparin 5000 units sq tid   aspirin 81 mg daily prior to admission, now on ASA 325mg . Continue on discharge. Pt not AC candidate now (see below)  Therapy recommendations:  pending   Disposition:  pending  (admitted from [place])  Left ICA high grade stenosis  CTA showed left ICA short segment near occlusion  Likely the cause of  stroke  However, pt not candidate for CEA or CAS now due to aphasia and dementia  Will follow up with [facility] as outpt  Possible intracranial Mass  Code Stroke CT head Possible small hyperdense L precentral gyrus mass.   MRI w/wo not cooperative  CT w/wo pending  Not tPA candidate  Paroxysmal Atrial Fibrillation  Home anticoagulation:  none   Not on AC due to fall risk  . On ASA at [facility] . Not an AC at this time d/t acute stroke and possible intracranial mass . Not good candidate for [facility] at this time given fall risk, dementia, acute infarct   Hypertension  Home meds:  norvasc 2.5  Stable . Long-term BP goal 130-150 given b/l ICA stenosis  Hyperlipidemia  Home meds:  lipitor 10   LDL 122, goal < 70  Resumed statin   Continue statin at discharge  Diabetes type II Uncontrolled  Home meds:  metformin  HgbA1c 7.8, goal < 7.0  Glucose 412 on admission   CBGs  SSI  Close PCP follow up for better DM control  CKD stage III  Cre 1.69-1.80-1.56  On IVF  Continue BMP monitoring  Dysphagia . Secondary to stroke . NPO . Speech on board . On IVF   Other Stroke Risk Factors  Advanced age  Other Active Problems  Alzheimer's Dementia on aricept  Hypothyroid on synthroid  Recent UTI, on abx 3 days PTA - UA negative this admission  Hospital day # 3  Recommend check EEG for seizure activity given finding of left frontal metastatic lesion.  Patient has baseline Alzheimer's and confusion may be postictal rather than a new stroke as CT scan x2 have not shown it.  MRI not done as patient was unable to lay still.  Could try again after sedation with Haldol and Ativan.  No family available at the bedside.  Discussed with family goals of care and whether they want to be aggressive in work-up his brain metastasis to look for primary elsewhere or would they prefer more of a palliative care approach given his advanced age of 65 years.  Discussed with Dr. Zigmund Daniel.   Greater than 50% time during this 35-minute visit was spent on counseling and coordination of care about his episode of confusion and possible seizure versus stroke and discussion with care team Robert Contras, MD    To contact Stroke Continuity provider, please refer to [email or web address] After hours, contact General Neurology

## 2020-01-03 NOTE — Discharge Summary (Addendum)
Physician Discharge Summary  [name]. OQH:[id number] DOB: [birth year] DOA: 12/31/2019  PCP: Patient, No Pcp Per  Admit date: 12/31/2019 Discharge date:01/05/20  Admitted From: Assisted living Disposition:  Assisted living Recommendations for Outpatient Follow-up:  1. Please follow up with palliative care at the facility  Home Health none Equipment/Devices: None Discharge Condition: Stable CODE STATUS: DNR Diet recommendation: Regular Brief/Interim Summary: 86 year old male admitted from assisted living facility with altered mental status patient became nonverbal unresponsive.  He has history of dementia, type 2 diabetes, paroxysmal atrial fibrillation hypertension and hypothyroidism.   Discharge Diagnoses:  Active Problems:   Cerebral embolism with cerebral infarction   TIA (transient ischemic attack)   #1 left MCA infarct-patient had CT scan x2 showed stroke but also showed left frontal mass 11/12 mm concerning for malignancy.  CTA head & neck no LVO.  R ICA origin 70%. Proximal L ICA short near occlusion. Proximal supraclinoid ICA moderate to severe.  L A1 severe stenosis. Possible small mass L precentral gyrus.   CT perfusion no core. L MCA, L MCA/PCA watershed territory at risk Echo showed no evidence of embolus ejection fraction 60 to 65%.  LDL was 122.  A1c was 7.8  Patient also was found to have left ICA high-grade stenosis.  DISCUSSED WITH MARTHA DIL IN DETAIL.FAMILY DOES NOT WANT ANY AGGRESSIVE [place] WANT TO KEEP HIM COMFORTABLE.  #2 paroxysmal atrial fibrillation not on anticoagulation due to multiple falls.  #3 CKD stage III stable  #4 dysphagia secondary to stroke  #5 type 2 diabetes continue home meds  #6 hypothyroidism continue Synthroid  #7 history of Alzheimer's dementia continue supportive treatment  #8 history of essential hypertension continue home meds  .  Estimated body mass index is 23.45 kg/m as calculated from the following:    Height as of this encounter: 5\' 10"  (1.778 m).   Weight as of this encounter: 74.1 kg.  Discharge Instructions  Discharge Instructions    Ambulatory referral to Neurology   Complete by: As directed    Follow up with stroke clinic NP (Jessica Vanschaick or Cecille Rubin, if both not available, consider Zachery Dauer, or Ahern) at [facility] in about 4 weeks. Thanks.   Ambulatory referral to Vascular Surgery   Complete by: As directed    Left ICA near occlusion. Thanks.     Allergies as of 01/03/2020   No Known Allergies     Medication List    STOP taking these medications   aspirin 81 MG tablet   atorvastatin 10 MG tablet Commonly known as: LIPITOR   cephALEXin 500 MG capsule Commonly known as: KEFLEX   donepezil 10 MG tablet Commonly known as: ARICEPT   levofloxacin 750 MG tablet Commonly known as: Levaquin   metFORMIN 500 MG tablet Commonly known as: GLUCOPHAGE   Vitamin D3 50 MCG (2000 UT) Tabs     TAKE these medications   acetaminophen 325 MG tablet Commonly known as: TYLENOL Take 2 tablets (650 mg total) by mouth every 4 (four) hours as needed for mild pain (or temp > 37.5 C (99.5 F)).   amLODipine 2.5 MG tablet Commonly known as: NORVASC Take 1 tablet (2.5 mg total) by mouth daily. What changed: when to take this   ammonium lactate 12 % lotion Commonly known as: LAC-HYDRIN Apply 1 application topically See admin instructions. Apply to both feet at bedtime   Combivent Respimat 20-100 MCG/ACT Aers respimat Generic drug: Ipratropium-Albuterol Inhale 1 puff into the lungs in the morning and at  bedtime.   glucose blood test strip Commonly known as: OneTouch Verio Use to test blood sugars daily. Dx: E11.9   lactose free nutrition Liqd Take 237 mLs by mouth 2 (two) times daily between meals.   levothyroxine 88 MCG tablet Commonly known as: SYNTHROID Take 88 mcg by mouth daily before breakfast.   montelukast 10 MG tablet Commonly known as:  SINGULAIR Take 10 mg by mouth daily.   onetouch ultrasoft lancets Use to test blood sugars daily. Dx: E11.9   ProAir HFA 108 (90 Base) MCG/ACT inhaler Generic drug: albuterol Inhale 2 puffs into the lungs 4 (four) times daily as needed for wheezing or shortness of breath.   Robafen DM Cgh/Chest Congest 10-100 MG/5ML liquid Generic drug: dextromethorphan-guaiFENesin Take 5 mLs by mouth 3 (three) times daily as needed for cough.   Voltaren 1 % Gel Generic drug: diclofenac Sodium Apply 2 g topically 3 (three) times daily as needed (for pain- RIGHT ELBOW).       Follow-up Information    Home, [facility] Follow up.   Specialty: Home Health Services Why: Representative from [place] will see you at [facility] for home health services. Contact information: [address] [place] [address] [phone]              No Known Allergies  Consultations:  Neurology   Procedures/Studies: CT ANGIO HEAD W OR WO CONTRAST  Result Date: 12/31/2019 CLINICAL DATA:  Code stroke follow-up EXAM: CT ANGIOGRAPHY HEAD AND NECK CT PERFUSION BRAIN TECHNIQUE: Multidetector CT imaging of the head and neck was performed using the standard protocol during bolus administration of intravenous contrast. Multiplanar CT image reconstructions and MIPs were obtained to evaluate the vascular anatomy. Carotid stenosis measurements (when applicable) are obtained utilizing NASCET criteria, using the distal internal carotid diameter as the denominator. Multiphase CT imaging of the brain was performed following IV bolus contrast injection. Subsequent parametric perfusion maps were calculated using RAPID software. CONTRAST:  100 mL Omnipaque 350 COMPARISON:  None. FINDINGS: CTA NECK FINDINGS Aortic arch: Mild calcified plaque along the arch and at the patent great vessel origins. Right carotid system: Patent. Mild plaque the carotid. There is primarily calcified plaque at the ICA origin causing nearly  70% stenosis. Left carotid system: Patent. Mild plaque along the common carotid. There is mild calcified plaque at the ICA origin without high-grade stenosis. Just beyond the origin, there is additional primarily noncalcified plaque with resulting near occlusion over a short segment with subsequent reconstitution. Vertebral arteries: Patent. Left vertebral artery is dominant. There is calcified plaque at the vertebral artery origins causing mild stenosis. Skeleton: Multilevel degenerative changes of the cervical spine. Other neck: No mass or adenopathy. Upper chest: Included upper lungs are clear. Review of the MIP images confirms the above findings CTA HEAD FINDINGS Anterior circulation: Intracranial internal carotid arteries patent with calcified plaque. There is moderate to severe stenosis of the proximal supraclinoid portions bilaterally. Anterior cerebral arteries are patent. There is severe stenosis of the left A1 ACA. Middle cerebral arteries are patent with left M1 MCA atherosclerotic irregularity. Posterior circulation: Intracranial vertebral arteries are patent minimal calcified plaque. Basilar artery is patent with atherosclerotic irregularity and mild to moderate stenosis. Posterior cerebral arteries are patent atherosclerotic irregularity and mild stenosis. Venous sinuses: As permitted by contrast timing, patent. Corresponding to suspected abnormality on noncontrast CT, there remains a possible mass lesion along the left precentral gyrus. Review of the MIP images confirms the above findings CT Brain Perfusion Findings:  CBF (<30%) Volume: 64mL Perfusion (Tmax>6.0s) volume: 90mL Mismatch Volume: 38mL Infarction Location: None IMPRESSION: No large vessel occlusion. Plaque at the right ICA origin causes nearly 70% stenosis. There is short segment near occlusion of the proximal left ICA with reconstitution. Intracranial atherosclerosis including moderate to severe stenoses of the proximal supraclinoid ICAs  and severe stenosis of the left A1 ACA. Perfusion imaging demonstrates no evidence of core infarction. There is calculated territory at risk in the left MCA territory and left MCA/PCA watershed. Persistent possible small mass lesion along the left precentral gyrus. MRI with contrast is recommended for further evaluation. These results were called by telephone at the time of interpretation on 12/31/2019 at 2:02 pm to provider [facility] , who verbally acknowledged these results. Electronically Signed   By: Macy Mis M.D.   On: 12/31/2019 14:17   DG Chest 2 View  Result Date: 12/31/2019 CLINICAL DATA:  TIA, hypertension, atrial fibrillation EXAM: CHEST - 2 VIEW COMPARISON:  06/25/2019 FINDINGS: Frontal and lateral views of the chest demonstrate an unremarkable cardiac silhouette. Lung volumes are diminished, without airspace disease, effusion, or pneumothorax. No acute bony abnormalities. IMPRESSION: 1. No acute intrathoracic process. Electronically Signed   By: Randa Ngo M.D.   On: 12/31/2019 15:33   CT HEAD W & WO CONTRAST  Result Date: 01/03/2020 CLINICAL DATA:  Ataxia.  Stroke.  Mass lesion identified on CT. EXAM: CT HEAD WITHOUT AND WITH CONTRAST TECHNIQUE: Contiguous axial images were obtained from the base of the skull through the vertex without and with intravenous contrast CONTRAST:  33mL OMNIPAQUE IOHEXOL 300 MG/ML  SOLN COMPARISON:  CT head and CTA head 12/31/2019 FINDINGS: Brain: Advanced atrophy. Extensive chronic microvascular ischemic changes in the white matter bilaterally. No acute cortical infarct. No acute hemorrhage 11 x 12 mm enhancing mass lesion in the left prefrontal cortex. This appears intra-axial and has central necrosis. Probable metastatic deposit. No other enhancing lesions identified. Vascular: Negative for hyperdense vessel. Atherosclerotic calcification in the cavernous carotid bilaterally. Normal venous enhancement. Skull: Negative Sinuses/Orbits: Depressed fracture  left orbital floor. This was not present on the CT of 06/25/2019. Correlate with symptoms of recent injury. Bilateral cataract extraction. Mild mucosal edema paranasal sinuses. Other: None IMPRESSION: 11 x 12 mm enhancing mass left prefrontal cortex compatible with metastatic disease. Extensive atrophy. Extensive chronic microvascular ischemic change in the white matter Depressed fracture left orbital floor. Correlate with symptoms and injury. Electronically Signed   By: Franchot Gallo M.D.   On: 01/03/2020 10:13   CT ANGIO NECK W OR WO CONTRAST  Result Date: 12/31/2019 CLINICAL DATA:  Code stroke follow-up EXAM: CT ANGIOGRAPHY HEAD AND NECK CT PERFUSION BRAIN TECHNIQUE: Multidetector CT imaging of the head and neck was performed using the standard protocol during bolus administration of intravenous contrast. Multiplanar CT image reconstructions and MIPs were obtained to evaluate the vascular anatomy. Carotid stenosis measurements (when applicable) are obtained utilizing NASCET criteria, using the distal internal carotid diameter as the denominator. Multiphase CT imaging of the brain was performed following IV bolus contrast injection. Subsequent parametric perfusion maps were calculated using RAPID software. CONTRAST:  100 mL Omnipaque 350 COMPARISON:  None. FINDINGS: CTA NECK FINDINGS Aortic arch: Mild calcified plaque along the arch and at the patent great vessel origins. Right carotid system: Patent. Mild plaque the carotid. There is primarily calcified plaque at the ICA origin causing nearly 70% stenosis. Left carotid system: Patent. Mild plaque along the common carotid. There is mild calcified plaque at the ICA  origin without high-grade stenosis. Just beyond the origin, there is additional primarily noncalcified plaque with resulting near occlusion over a short segment with subsequent reconstitution. Vertebral arteries: Patent. Left vertebral artery is dominant. There is calcified plaque at the vertebral  artery origins causing mild stenosis. Skeleton: Multilevel degenerative changes of the cervical spine. Other neck: No mass or adenopathy. Upper chest: Included upper lungs are clear. Review of the MIP images confirms the above findings CTA HEAD FINDINGS Anterior circulation: Intracranial internal carotid arteries patent with calcified plaque. There is moderate to severe stenosis of the proximal supraclinoid portions bilaterally. Anterior cerebral arteries are patent. There is severe stenosis of the left A1 ACA. Middle cerebral arteries are patent with left M1 MCA atherosclerotic irregularity. Posterior circulation: Intracranial vertebral arteries are patent minimal calcified plaque. Basilar artery is patent with atherosclerotic irregularity and mild to moderate stenosis. Posterior cerebral arteries are patent atherosclerotic irregularity and mild stenosis. Venous sinuses: As permitted by contrast timing, patent. Corresponding to suspected abnormality on noncontrast CT, there remains a possible mass lesion along the left precentral gyrus. Review of the MIP images confirms the above findings CT Brain Perfusion Findings: CBF (<30%) Volume: 34mL Perfusion (Tmax>6.0s) volume: 63mL Mismatch Volume: 66mL Infarction Location: None IMPRESSION: No large vessel occlusion. Plaque at the right ICA origin causes nearly 70% stenosis. There is short segment near occlusion of the proximal left ICA with reconstitution. Intracranial atherosclerosis including moderate to severe stenoses of the proximal supraclinoid ICAs and severe stenosis of the left A1 ACA. Perfusion imaging demonstrates no evidence of core infarction. There is calculated territory at risk in the left MCA territory and left MCA/PCA watershed. Persistent possible small mass lesion along the left precentral gyrus. MRI with contrast is recommended for further evaluation. These results were called by telephone at the time of interpretation on 12/31/2019 at 2:02 pm to  provider [facility] , who verbally acknowledged these results. Electronically Signed   By: Macy Mis M.D.   On: 12/31/2019 14:17   CT CEREBRAL PERFUSION W CONTRAST  Result Date: 12/31/2019 CLINICAL DATA:  Code stroke follow-up EXAM: CT ANGIOGRAPHY HEAD AND NECK CT PERFUSION BRAIN TECHNIQUE: Multidetector CT imaging of the head and neck was performed using the standard protocol during bolus administration of intravenous contrast. Multiplanar CT image reconstructions and MIPs were obtained to evaluate the vascular anatomy. Carotid stenosis measurements (when applicable) are obtained utilizing NASCET criteria, using the distal internal carotid diameter as the denominator. Multiphase CT imaging of the brain was performed following IV bolus contrast injection. Subsequent parametric perfusion maps were calculated using RAPID software. CONTRAST:  100 mL Omnipaque 350 COMPARISON:  None. FINDINGS: CTA NECK FINDINGS Aortic arch: Mild calcified plaque along the arch and at the patent great vessel origins. Right carotid system: Patent. Mild plaque the carotid. There is primarily calcified plaque at the ICA origin causing nearly 70% stenosis. Left carotid system: Patent. Mild plaque along the common carotid. There is mild calcified plaque at the ICA origin without high-grade stenosis. Just beyond the origin, there is additional primarily noncalcified plaque with resulting near occlusion over a short segment with subsequent reconstitution. Vertebral arteries: Patent. Left vertebral artery is dominant. There is calcified plaque at the vertebral artery origins causing mild stenosis. Skeleton: Multilevel degenerative changes of the cervical spine. Other neck: No mass or adenopathy. Upper chest: Included upper lungs are clear. Review of the MIP images confirms the above findings CTA HEAD FINDINGS Anterior circulation: Intracranial internal carotid arteries patent with calcified plaque. There is moderate  to severe stenosis of  the proximal supraclinoid portions bilaterally. Anterior cerebral arteries are patent. There is severe stenosis of the left A1 ACA. Middle cerebral arteries are patent with left M1 MCA atherosclerotic irregularity. Posterior circulation: Intracranial vertebral arteries are patent minimal calcified plaque. Basilar artery is patent with atherosclerotic irregularity and mild to moderate stenosis. Posterior cerebral arteries are patent atherosclerotic irregularity and mild stenosis. Venous sinuses: As permitted by contrast timing, patent. Corresponding to suspected abnormality on noncontrast CT, there remains a possible mass lesion along the left precentral gyrus. Review of the MIP images confirms the above findings CT Brain Perfusion Findings: CBF (<30%) Volume: 11mL Perfusion (Tmax>6.0s) volume: 2mL Mismatch Volume: 68mL Infarction Location: None IMPRESSION: No large vessel occlusion. Plaque at the right ICA origin causes nearly 70% stenosis. There is short segment near occlusion of the proximal left ICA with reconstitution. Intracranial atherosclerosis including moderate to severe stenoses of the proximal supraclinoid ICAs and severe stenosis of the left A1 ACA. Perfusion imaging demonstrates no evidence of core infarction. There is calculated territory at risk in the left MCA territory and left MCA/PCA watershed. Persistent possible small mass lesion along the left precentral gyrus. MRI with contrast is recommended for further evaluation. These results were called by telephone at the time of interpretation on 12/31/2019 at 2:02 pm to provider [facility] , who verbally acknowledged these results. Electronically Signed   By: Macy Mis M.D.   On: 12/31/2019 14:17   DG Abd Portable 1V  Result Date: 01/01/2020 CLINICAL DATA:  Altered mental status. Clearance for MRI EXAM: PORTABLE ABDOMEN - 1 VIEW COMPARISON:  None. FINDINGS: The bowel gas pattern is unremarkable. Vascular calcifications are noted. No significant  bony findings. No metallic foreign bodies are identified. IMPRESSION: Unremarkable abdominal radiographs. Electronically Signed   By: Marijo Sanes M.D.   On: 01/01/2020 05:21   ECHOCARDIOGRAM COMPLETE  Result Date: 12/31/2019    ECHOCARDIOGRAM REPORT   Patient Name:   Marquavious Nazar. Date of Exam: 12/31/2019 Medical Rec #:  [id number]            Height:       70.0 in Accession #:    [id number]           Weight:       185.8 lb Date of Birth:  1926-07-02             BSA:          2.023 m Patient Age:    86 years             BP:           140/83 mmHg Patient Gender: M                    HR:           69 bpm. Exam Location:  Inpatient Procedure: 2D Echo Indications:    TIA  History:        Patient has no prior history of Echocardiogram examinations.                 Arrythmias:Atrial Fibrillation; Risk Factors:Diabetes,                 Hypertension and Dyslipidemia.  Sonographer:    Jannett Celestine RDCS (AE) Referring Phys: [id number] Lequita Halt  Sonographer Comments: restricted mobility. IMPRESSIONS  1. Left ventricular ejection fraction, by estimation, is 60 to 65%. The left ventricle has normal function. The left ventricle  has no regional wall motion abnormalities. There is moderate concentric left ventricular hypertrophy. Left ventricular diastolic parameters are consistent with Grade I diastolic dysfunction (impaired relaxation). Elevated left ventricular end-diastolic pressure.  2. Right ventricular systolic function is normal. The right ventricular size is normal.  3. The mitral valve is normal in structure. No evidence of mitral valve regurgitation. No evidence of mitral stenosis.  4. The aortic valve is tricuspid. Aortic valve regurgitation is not visualized. No aortic stenosis is present.  5. Aortic dilatation noted. There is moderate dilatation at the level of the sinuses of Valsalva measuring 46 mm.  6. The inferior vena cava is normal in size with greater than 50% respiratory variability, suggesting  right atrial pressure of 3 mmHg. FINDINGS  Left Ventricle: Left ventricular ejection fraction, by estimation, is 60 to 65%. The left ventricle has normal function. The left ventricle has no regional wall motion abnormalities. The left ventricular internal cavity size was normal in size. There is  moderate concentric left ventricular hypertrophy. Left ventricular diastolic parameters are consistent with Grade I diastolic dysfunction (impaired relaxation). Elevated left ventricular end-diastolic pressure. Right Ventricle: The right ventricular size is normal. No increase in right ventricular wall thickness. Right ventricular systolic function is normal. Left Atrium: Left atrial size was normal in size. Right Atrium: Right atrial size was normal in size. Pericardium: There is no evidence of pericardial effusion. Mitral Valve: The mitral valve is normal in structure. Normal mobility of the mitral valve leaflets. No evidence of mitral valve regurgitation. No evidence of mitral valve stenosis. Tricuspid Valve: The tricuspid valve is normal in structure. Tricuspid valve regurgitation is not demonstrated. No evidence of tricuspid stenosis. Aortic Valve: The aortic valve is tricuspid. Aortic valve regurgitation is not visualized. No aortic stenosis is present. Pulmonic Valve: The pulmonic valve was normal in structure. Pulmonic valve regurgitation is not visualized. No evidence of pulmonic stenosis. Aorta: Aortic dilatation noted. There is moderate dilatation at the level of the sinuses of Valsalva measuring 46 mm. Venous: The inferior vena cava is normal in size with greater than 50% respiratory variability, suggesting right atrial pressure of 3 mmHg. IAS/Shunts: No atrial level shunt detected by color flow Doppler.  LEFT VENTRICLE PLAX 2D LVIDd:         2.80 cm  Diastology LVIDs:         2.30 cm  LV e' lateral:   5.33 cm/s LV PW:         1.40 cm  LV E/e' lateral: 17.2 LV IVS:        1.40 cm  LV e' medial:    4.62 cm/s LVOT  diam:     2.40 cm  LV E/e' medial:  19.9 LV SV:         72 LV SV Index:   36 LVOT Area:     4.52 cm  LEFT ATRIUM           Index LA diam:      3.80 cm 1.88 cm/m LA Vol (A2C): 39.2 ml 19.38 ml/m  AORTIC VALVE LVOT Vmax:   70.80 cm/s LVOT Vmean:  55.500 cm/s LVOT VTI:    0.159 m  AORTA Ao Root diam: 4.10 cm MITRAL VALVE MV Area (PHT): 2.20 cm     SHUNTS MV Decel Time: 345 msec     Systemic VTI:  0.16 m MV E velocity: 91.90 cm/s   Systemic Diam: 2.40 cm MV A velocity: 115.00 cm/s MV E/A ratio:  0.80 Skeet Latch MD  Electronically signed by Skeet Latch MD Signature Date/Time: 12/31/2019/5:08:30 PM    Final    CT HEAD CODE STROKE WO CONTRAST  Addendum Date: 12/31/2019   ADDENDUM REPORT: 12/31/2019 13:50 ADDENDUM: Question of a subcentimeter peripherally hyperdense lesion along the left precentral gyrus (series 3, image 22). This can be further evaluated on upcoming CTA. Electronically Signed   By: Macy Mis M.D.   On: 12/31/2019 13:50   Result Date: 12/31/2019 CLINICAL DATA:  Code stroke. EXAM: CT HEAD WITHOUT CONTRAST TECHNIQUE: Contiguous axial images were obtained from the base of the skull through the vertex without intravenous contrast. COMPARISON:  06/25/2019 FINDINGS: Brain: There is no acute intracranial hemorrhage, mass effect, or edema. No new loss of gray-white differentiation. Prominence of the ventricles and sulci reflects stable generalized parenchymal volume loss. Patchy and confluent areas of hypoattenuation in the supratentorial white matter are nonspecific but probably reflect stable advanced chronic microvascular ischemic changes. Vascular: No hyperdense vessel. There is intracranial atherosclerotic calcification at skull base. Skull: Unremarkable. Sinuses/Orbits: Patchy mucosal thickening. Bilateral lens replacements. Other: Mastoid air cells are clear. ASPECTS ([place] Stroke Program Early CT Score) - Ganglionic level infarction (caudate, lentiform nuclei, internal capsule,  insula, M1-M3 cortex): 7 - Supraganglionic infarction (M4-M6 cortex): 3 Total score (0-10 with 10 being normal): 10 IMPRESSION: No evidence of acute infarction or intracranial hemorrhage. ASPECT score is 10. Stable chronic findings detailed above. These results were communicated to Dr. Erlinda Hong At 1:39 pmon 7/14/2021by text page via the [facility] messaging system. Electronically Signed: By: Macy Mis M.D. On: 12/31/2019 13:40    (Echo, Carotid, EGD, Colonoscopy, ERCP)    Subjective:  Patient confused restless and moves all extremities Discharge Exam: Vitals:   01/03/20 0820 01/03/20 1226  BP: (!) 158/79 (!) 138/56  Pulse: 71 75  Resp: 15 18  Temp: 97.8 F (36.6 C) 97.6 F (36.4 C)  SpO2: 99% 97%   Vitals:   01/02/20 2038 01/03/20 0447 01/03/20 0820 01/03/20 1226  BP: (!) 168/81 139/76 (!) 158/79 (!) 138/56  Pulse: 66 68 71 75  Resp: 18 18 15 18   Temp: 98.3 F (36.8 C) 97.6 F (36.4 C) 97.8 F (36.6 C) 97.6 F (36.4 C)  TempSrc: Oral Oral Oral Oral  SpO2: 100% 96% 99% 97%  Weight:      Height:        General: Pt is alert, awake, not in acute distress Cardiovascular: RRR, S1/S2 +, no rubs, no gallops Respiratory: CTA bilaterally, no wheezing, no rhonchi Abdominal: Soft, NT, ND, bowel sounds + Extremities: no edema, no cyanosis    The results of significant diagnostics from this hospitalization (including imaging, microbiology, ancillary and laboratory) are listed below for reference.     Microbiology: Recent Results (from the past 240 hour(s))  SARS Coronavirus 2 by RT PCR (hospital order, performed in [facility] hospital lab) Nasopharyngeal Nasopharyngeal Swab     Status: None   Collection Time: 12/31/19  2:29 PM   Specimen: Nasopharyngeal Swab  Result Value Ref Range Status   SARS Coronavirus 2 NEGATIVE NEGATIVE Final    Comment: (NOTE) SARS-CoV-2 target nucleic acids are NOT DETECTED.  The SARS-CoV-2 RNA is generally detectable in upper and lower respiratory  specimens during the acute phase of infection. The lowest concentration of SARS-CoV-2 viral copies this assay can detect is 250 copies / mL. A negative result does not preclude SARS-CoV-2 infection and should not be used as the sole basis for treatment or other patient management decisions.  A negative  result may occur with improper specimen collection / handling, submission of specimen other than nasopharyngeal swab, presence of viral mutation(s) within the areas targeted by this assay, and inadequate number of viral copies (<250 copies / mL). A negative result must be combined with clinical observations, patient history, and epidemiological information.  Fact Sheet for Patients:   StrictlyIdeas.no  Fact Sheet for Healthcare Providers: BankingDealers.co.za  This test is not yet approved or  cleared by the [place] FDA and has been authorized for detection and/or diagnosis of SARS-CoV-2 by FDA under an Emergency Use Authorization (EUA).  This EUA will remain in effect (meaning this test can be used) for the duration of the COVID-19 declaration under Section 564(b)(1) of the Act, 21 U.S.C. section 360bbb-3(b)(1), unless the authorization is terminated or revoked sooner.  Performed at [facility], [place] [address], [place], [place] [address]   MRSA PCR Screening     Status: None   Collection Time: 01/01/20  9:25 PM   Specimen: Nasal Mucosa; Nasopharyngeal  Result Value Ref Range Status   MRSA by PCR NEGATIVE NEGATIVE Final    Comment:        The GeneXpert MRSA Assay (FDA approved for NASAL specimens only), is one component of a comprehensive MRSA colonization surveillance program. It is not intended to diagnose MRSA infection nor to guide or monitor treatment for MRSA infections. Performed at [facility], [place] [address], [place], [place] [address]      Labs: BNP (last 3 results) No results for input(s): BNP  in the last 8760 hours. Basic Metabolic Panel: Recent Labs  Lab 12/31/19 1325 12/31/19 1331 01/01/20 0449  NA 141 142 143  K 5.0 4.9 4.3  CL 108 107 109  CO2 23  --  24  GLUCOSE 307* 297* 121*  BUN 35* 37* 28*  CREATININE 1.69* 1.80* 1.56*  CALCIUM 8.8*  --  8.7*   Liver Function Tests: Recent Labs  Lab 12/31/19 1325  AST 24  ALT 21  ALKPHOS 98  BILITOT 0.7  PROT 6.1*  ALBUMIN 3.2*   No results for input(s): LIPASE, AMYLASE in the last 168 hours. No results for input(s): AMMONIA in the last 168 hours. CBC: Recent Labs  Lab 12/31/19 1325 12/31/19 1331 01/03/20 1300  WBC 4.6  --  5.3  NEUTROABS 3.1  --   --   HGB 11.2* 11.2* 12.1*  HCT 35.4* 33.0* 38.0*  MCV 93.7  --  93.4  PLT 171  --  176   Cardiac Enzymes: No results for input(s): CKTOTAL, CKMB, CKMBINDEX, TROPONINI in the last 168 hours. BNP: Invalid input(s): POCBNP CBG: Recent Labs  Lab 01/02/20 1603 01/02/20 2113 01/03/20 0630 01/03/20 0819 01/03/20 1222  GLUCAP 137* 123* 95 99 214*   D-Dimer No results for input(s): DDIMER in the last 72 hours. Hgb A1c Recent Labs    01/01/20 0449  HGBA1C 7.8*   Lipid Profile Recent Labs    01/01/20 0449  CHOL 184  HDL 44  LDLCALC 122*  TRIG 90  CHOLHDL 4.2   Thyroid function studies No results for input(s): TSH, T4TOTAL, T3FREE, THYROIDAB in the last 72 hours.  Invalid input(s): FREET3 Anemia work up No results for input(s): VITAMINB12, FOLATE, FERRITIN, TIBC, IRON, RETICCTPCT in the last 72 hours. Urinalysis    Component Value Date/Time   COLORURINE YELLOW 01/01/2020 0114   APPEARANCEUR CLEAR 01/01/2020 0114   LABSPEC 1.028 01/01/2020 0114   PHURINE 7.0 01/01/2020 0114   GLUCOSEU  150 (A) 01/01/2020 0114   HGBUR NEGATIVE 01/01/2020 0114   BILIRUBINUR NEGATIVE 01/01/2020 0114   BILIRUBINUR Negative 04/10/2017 1620   KETONESUR NEGATIVE 01/01/2020 0114   PROTEINUR 30 (A) 01/01/2020 0114   UROBILINOGEN 0.2 04/10/2017 1620   UROBILINOGEN  1.0 12/21/2008 2206   NITRITE NEGATIVE 01/01/2020 0114   LEUKOCYTESUR NEGATIVE 01/01/2020 0114   Sepsis Labs Invalid input(s): PROCALCITONIN,  WBC,  LACTICIDVEN Microbiology Recent Results (from the past 240 hour(s))  SARS Coronavirus 2 by RT PCR (hospital order, performed in [place] hospital lab) Nasopharyngeal Nasopharyngeal Swab     Status: None   Collection Time: 12/31/19  2:29 PM   Specimen: Nasopharyngeal Swab  Result Value Ref Range Status   SARS Coronavirus 2 NEGATIVE NEGATIVE Final    Comment: (NOTE) SARS-CoV-2 target nucleic acids are NOT DETECTED.  The SARS-CoV-2 RNA is generally detectable in upper and lower respiratory specimens during the acute phase of infection. The lowest concentration of SARS-CoV-2 viral copies this assay can detect is 250 copies / mL. A negative result does not preclude SARS-CoV-2 infection and should not be used as the sole basis for treatment or other patient management decisions.  A negative result may occur with improper specimen collection / handling, submission of specimen other than nasopharyngeal swab, presence of viral mutation(s) within the areas targeted by this assay, and inadequate number of viral copies (<250 copies / mL). A negative result must be combined with clinical observations, patient history, and epidemiological information.  Fact Sheet for Patients:   StrictlyIdeas.no  Fact Sheet for Healthcare Providers: BankingDealers.co.za  This test is not yet approved or  cleared by the [place] FDA and has been authorized for detection and/or diagnosis of SARS-CoV-2 by FDA under an Emergency Use Authorization (EUA).  This EUA will remain in effect (meaning this test can be used) for the duration of the COVID-19 declaration under Section 564(b)(1) of the Act, 21 U.S.C. section 360bbb-3(b)(1), unless the authorization is terminated or revoked sooner.  Performed at [facility], [place] [address] [place], [place] [address]   MRSA PCR Screening     Status: None   Collection Time: 01/01/20  9:25 PM   Specimen: Nasal Mucosa; Nasopharyngeal  Result Value Ref Range Status   MRSA by PCR NEGATIVE NEGATIVE Final    Comment:        The GeneXpert MRSA Assay (FDA approved for NASAL specimens only), is one component of a comprehensive MRSA colonization surveillance program. It is not intended to diagnose MRSA infection nor to guide or monitor treatment for MRSA infections. Performed at [facility], [place] [address] [place], [place] [address]      Time coordinating discharge:  39 minutes  SIGNED:   Georgette Shell, MD  [facility] 01/03/2020, 1:49 PM  If 7PM-7AM, please contact night-coverage [email or web address] Password [email or web address]

## 2020-01-03 NOTE — Progress Notes (Signed)
Pt was noted to be anxious on/off through out the night. Pulling at his clothing, removing clothing and blankes from bed but easily redirected. The one thing that I did notice was his inability to tolerate thin liquids. This morning, I administered his medication crushed in applesause and he tolerated it well compared to last night when given to him with tea. It might be good for speech to recheck him for tolerance of thin liquids. I will pass this information on the the day shift nurse as well.

## 2020-01-04 ENCOUNTER — Inpatient Hospital Stay (HOSPITAL_COMMUNITY): Payer: Medicare Other

## 2020-01-04 DIAGNOSIS — R569 Unspecified convulsions: Secondary | ICD-10-CM

## 2020-01-04 DIAGNOSIS — I6522 Occlusion and stenosis of left carotid artery: Secondary | ICD-10-CM

## 2020-01-04 LAB — GLUCOSE, CAPILLARY
Glucose-Capillary: 98 mg/dL (ref 70–99)
Glucose-Capillary: 220 mg/dL — ABNORMAL HIGH (ref 70–99)
Glucose-Capillary: 254 mg/dL — ABNORMAL HIGH (ref 70–99)
Glucose-Capillary: 90 mg/dL (ref 70–99)

## 2020-01-04 NOTE — Progress Notes (Signed)
STROKE TEAM PROGRESS NOTE   INTERVAL HISTORY Pt sitting in bed, awake alert, he remains aphasic with   fluent speech but not able to name, repeat.  Follows only simple midline and one-step commands.  Dr. Zigmund Daniel are spoken to patient's family who have agreed to palliative care and comfort measures only and no further work-up. Vitals:   01/03/20 2044 01/03/20 2338 01/04/20 0343 01/04/20 0730  BP: 140/77 140/73 134/64 132/72  Pulse: 74 66 63 64  Resp: 16 18 18 18   Temp: 98.6 F (37 C) 98.6 F (37 C) 98.9 F (37.2 C) 98.2 F (36.8 C)  TempSrc: Oral Oral Oral Oral  SpO2: 98% 99% 98% 100%  Weight:      Height:       CBC:  Recent Labs  Lab 12/31/19 1325 12/31/19 1325 12/31/19 1331 01/03/20 1300  WBC 4.6  --   --  5.3  NEUTROABS 3.1  --   --   --   HGB 11.2*   < > 11.2* 12.1*  HCT 35.4*   < > 33.0* 38.0*  MCV 93.7  --   --  93.4  PLT 171  --   --  176   < > = values in this interval not displayed.   Basic Metabolic Panel:  Recent Labs  Lab 01/01/20 0449 01/03/20 1300  NA 143 140  K 4.3 4.1  CL 109 106  CO2 24 24  GLUCOSE 121* 243*  BUN 28* 29*  CREATININE 1.56* 1.67*  CALCIUM 8.7* 9.0   Lipid Panel:  Recent Labs  Lab 01/01/20 0449  CHOL 184  TRIG 90  HDL 44  CHOLHDL 4.2  VLDL 18  LDLCALC 122*   HgbA1c:  Recent Labs  Lab 01/01/20 0449  HGBA1C 7.8*   Urine Drug Screen: No results for input(s): LABOPIA, COCAINSCRNUR, LABBENZ, AMPHETMU, THCU, LABBARB in the last 168 hours.  Alcohol Level No results for input(s): ETH in the last 168 hours.  IMAGING past 24 hours No results found.   CT Head W and WO Contrast 12/1719 IMPRESSION: 11 x 12 mm enhancing mass left prefrontal cortex compatible with metastatic disease. Extensive atrophy. Extensive chronic microvascular ischemic change in the white matter Depressed fracture left orbital floor. Correlate with symptoms and injury.   EEG 01/03/2020 Description: No posterior dominant rhythm. Sleep was  characterized by vertex waves, maximal frontocentral region.  EEG showed continuous generalized 3 to 6 Hz theta-delta slowing. Hyperventilation and photic stimulation were not performed.     ABNORMALITY -Continuous slow, generalized  IMPRESSION: This study is suggestive of moderate diffuse encephalopathy, nonspecific etiology. No seizures or epileptiform discharges were seen throughout the recording.   PHYSICAL EXAM  Temp:  [97.6 F (36.4 C)-98.9 F (37.2 C)] 98.2 F (36.8 C) (07/18 0730) Pulse Rate:  [63-75] 64 (07/18 0730) Resp:  [15-18] 18 (07/18 0730) BP: (132-155)/(56-85) 132/72 (07/18 0730) SpO2:  [97 %-100 %] 100 % (07/18 0730)  General - mildly cachectic, well developed, in no acute distress.    Ophthalmologic - fundi not visualized due to noncooperation.    Cardiovascular - regular rhythm and rate  Neuro - awake, alert, eyes open, receptive aphasia with words salad, talkative but not make sense. Not cooperative on naming or repetition, hard of  Hearing. Tracking with eyes bilaterally, no gaze palsy, inconsistently blinking to visual threat bilaterally.  No significant facial droop.  Tongue protrusion not corporative.  Bilateral upper extremity at least 4/5, symmetrical.  Bilateral lower extremity at least 4/5, symmetrical.  Sensation, coordination not cooperative and gait not tested.   ASSESSMENT/PLAN [name]. is a 86 y.o. male with history of f alzheimer's dementia, hypothyroidism, remote AFib on ASA, well-controlled T2DM, and HTN presenting with aphasia and altered mental status. Plans to give tPA squelched when CT showed possible small L frontotemporal brain mass.    Altered mental status and confusion with CT scan x2 - for stroke but showing left frontal metastasis raising concern for possible seizure as the cause of his confusion.    Code Stroke CT head No acute abnormality. Possible small hyperdense L precentral gyrus mass. ASPECTS 10.     CTA  head & neck no LVO.  R ICA origin 70%. Proximal L ICA short near occlusion. Proximal supraclinoid ICA moderate to severe.  L A1 severe stenosis. Possible small mass L precentral gyrus.   CT perfusion no core. L MCA, L MCA/PCA watershed territory at risk  MRI w/w/o not cooperative  CT w/wo 7/17 - 11 x 12 mm enhancing mass left prefrontal cortex compatible with metastatic disease. Depressed fracture left orbital floor. Correlate with symptoms and injury.  2D Echo EF 60-65%. No source of embolus   EEG 01/03/20 - suggestive of moderate diffuse encephalopathy, nonspecific etiology. No seizures or epileptiform discharges were seen throughout the recording.  LDL 122  HgbA1c 7.8  VTE prophylaxis - Heparin 5000 units sq tid   aspirin 81 mg daily prior to admission, now on ASA 325mg . Continue on discharge. Pt not AC candidate now (see below)  Therapy recommendations:  HH therapies vs SNF  Disposition:  pending  (admitted from [facility])  Left ICA high grade stenosis  CTA showed left ICA short segment near occlusion  Likely the cause of stroke  However, pt not candidate for CEA or CAS now due to aphasia and dementia  Will follow up with [facility] as outpt  Possible intracranial Mass  Code Stroke CT head Possible small hyperdense L precentral gyrus mass.   MRI w/wo not cooperative  CT w/wo - 11 x 12 mm enhancing mass left prefrontal cortex compatible with metastatic disease.  Not tPA candidate  Paroxysmal Atrial Fibrillation  Home anticoagulation:  none   Not on AC due to fall risk  . On ASA at [facility] . Not an AC at this time d/t acute stroke and possible intracranial mass . Not good candidate for [facility] at this time given fall risk, dementia, acute infarct   Hypertension  Home meds:  norvasc 2.5  Stable . Long-term BP goal 130-150 given b/l ICA stenosis  Hyperlipidemia  Home meds:  lipitor 10   LDL 122, goal < 70  Resumed statin   Continue statin at  discharge  Diabetes type II Uncontrolled  Home meds:  metformin  HgbA1c 7.8, goal < 7.0  Glucose 412 on admission   CBGs  SSI  Close PCP follow up for better DM control  CKD stage III  Cre 1.69-1.80-1.56  On IVF  Continue BMP monitoring  Dysphagia . Secondary to stroke . NPO . Speech on board . On IVF   Other Stroke Risk Factors  Advanced age  Other Active Problems  Alzheimer's Dementia on aricept  Hypothyroid on synthroid  Recent UTI, on abx 3 days PTA - UA negative this admission  11 x 12 mm enhancing mass left prefrontal cortex compatible with metastatic disease.  Hospital day # 4 Agree with family's decision to not pursue aggressive work-up for his brain metastasis given his advanced age and  poor general medical condition.  Discussed with Dr. Zigmund Daniel.  Stroke team will sign off.  Kindly call for questions. Antony Contras, MD    To contact Stroke Continuity provider, please refer to [email or web address] After hours, contact General Neurology

## 2020-01-04 NOTE — TOC Progression Note (Signed)
Transition of Care (TOC) - Progression Note    Patient Details  Name: Robert Salinas. MRN: [id number] Date of Birth: 1926-12-06  Transition of Care [facility]) CM/SW [place], [place] Phone Number: 01/04/2020, 3:20 PM  Clinical Narrative:    CSW confirmed preliminary discharge summary and fl2 were received.  CSW resent signed fl2. Updated covid not needed, considering patient is vaccinated. Patient will be ready to discharge to facility Monday.  Expected Discharge Plan: Memory Care Barriers to Discharge: Continued Medical Work up  Expected Discharge Plan and Services Expected Discharge Plan: Memory Care     Post Acute Care Choice: Home Health, Nursing Home Living arrangements for the past 2 months: Assisted Living Facility                           HH Arranged: RN, PT, OT [facility] Agency: [facility] [employer] Date [place]: 01/02/20   Representative spoke with at [place]: [place] ([place]) Interventions    Readmission Risk Interventions No flowsheet data found.

## 2020-01-04 NOTE — Progress Notes (Signed)
EEG complete - results pending 

## 2020-01-04 NOTE — Progress Notes (Signed)
PROGRESS NOTE    Robert Salinas.  EYC:[id number] DOB: Jun 21, 1926 DOA: 12/31/2019 PCP: Patient, No Pcp Per    Brief Narrative: 86 year old male admitted from nursing home with change in mental status.he has history of Alzheimer's dementia hypothyroidism paroxysmal atrial fibrillation type 2 diabetes and hypertension admitted with aphasia.  Patient suddenly became nonverbal and unresponsive at the nursing home.  Admitted for stroke work-up.  Assessment & Plan:   Active Problems:   Cerebral embolism with cerebral infarction   TIA (transient ischemic attack)   #1 left MCA infarct-work-up shows CT angiogram of the head and neck with 70% right ICA origin proximal left ICA near occlusion severe supraclinoid ICA stenosis possible small  mass in the precentral gyrus CT head showed no acute stroke Echo normal ejection fraction LDL 122 Hemoglobin A1c 7.8 PT OT recommending SNF. Speech therapy has started him on a diet.  Discussed with patient's POA [name] daughter-in-law will not do any aggressive measures other than keeping him comfortable.  ct head  with likely mass left 11/12 mm.dw martha DIL wants no aggressive work up .Marland Kitchen  #2 CKD stage III creatinine 1.56 down from 1.80 on admission.  Patient became very agitated pulled out the IV out along with telemetry monitors.  Continue to encourage p.o. intake and DC IV fluids.  #3 dysphagia due to stroke consulted speech therapy started him on a diet.  #4 history of paroxysmal atrial fibrillation not on anticoagulation at the nursing home due to risk of falls. Rate is controlled.  #5 type 2 diabetes with hyperglycemia hemoglobin A1c is 7.8.  On SSI.  Was on Metformin prior to admission.  #6 hypothyroidism on Synthroid  #7 history of Alzheimer's dementia on Aricept  #8 history of essential hypertension-blood pressure 158/79. Was on Norvasc prior to admission.   Estimated body mass index is 23.45 kg/m as calculated from the following:    Height as of this encounter: 5\' 10"  (1.778 m).   Weight as of this encounter: 74.1 kg.  DVT prophylaxis: Heparin subcu twice daily Code Status: DNR Family Communication: dw daughter  Disposition Plan:  Status is: Inpatient   Dispo: The patient is from: SNF              Anticipated d/c is to: SNF              Anticipated d/c date is: 1day              Patient currently is not medically stable to d/c.  Patient admitted for acute stroke with ongoing work-up and pending physical and occupational therapy. Will dc back to [facility] today. Consultants:   Neurology  Procedures: None Antimicrobials: None Subjective: Patient restless in bed moving all extremities staff concerned about him not able to swallow liquids asking for a repeat swallow evaluation. Objective: Vitals:   01/03/20 2044 01/03/20 2338 01/04/20 0343 01/04/20 0730  BP: 140/77 140/73 134/64 132/72  Pulse: 74 66 63 64  Resp: 16 18 18 18   Temp: 98.6 F (37 C) 98.6 F (37 C) 98.9 F (37.2 C) 98.2 F (36.8 C)  TempSrc: Oral Oral Oral Oral  SpO2: 98% 99% 98% 100%  Weight:      Height:       No intake or output data in the 24 hours ending 01/04/20 0934 Filed Weights   12/31/19 1418 01/02/20 0000  Weight: 84.3 kg 74.1 kg    Examination:  General exam: Appears calm and comfortable  Respiratory system: Clear to auscultation.  Respiratory effort normal. Cardiovascular system: S1 & S2 heard, RRR. No JVD, murmurs, rubs, gallops or clicks. No pedal edema. Gastrointestinal system: Abdomen is nondistended, soft and nontender. No organomegaly or masses felt. Normal bowel sounds heard. Central nervous system: Confused does not answer any questions appropriately or follow any commands  extremities no edema Skin: No rashes, lesions or ulcers Psychiatry: Unable to assess.   Data Reviewed: I have personally reviewed following labs and imaging studies  CBC: Recent Labs  Lab 12/31/19 1325 12/31/19 1331 01/03/20 1300  WBC  4.6  --  5.3  NEUTROABS 3.1  --   --   HGB 11.2* 11.2* 12.1*  HCT 35.4* 33.0* 38.0*  MCV 93.7  --  93.4  PLT 171  --  376   Basic Metabolic Panel: Recent Labs  Lab 12/31/19 1325 12/31/19 1331 01/01/20 0449 01/03/20 1300  NA 141 142 143 140  K 5.0 4.9 4.3 4.1  CL 108 107 109 106  CO2 23  --  24 24  GLUCOSE 307* 297* 121* 243*  BUN 35* 37* 28* 29*  CREATININE 1.69* 1.80* 1.56* 1.67*  CALCIUM 8.8*  --  8.7* 9.0   GFR: Estimated Creatinine Clearance: 30.4 mL/min (A) (by C-G formula based on SCr of 1.67 mg/dL (H)). Liver Function Tests: Recent Labs  Lab 12/31/19 1325 01/03/20 1300  AST 24 21  ALT 21 16  ALKPHOS 98 116  BILITOT 0.7 0.6  PROT 6.1* 6.2*  ALBUMIN 3.2* 3.2*   No results for input(s): LIPASE, AMYLASE in the last 168 hours. No results for input(s): AMMONIA in the last 168 hours. Coagulation Profile: Recent Labs  Lab 12/31/19 1325  INR 1.0   Cardiac Enzymes: No results for input(s): CKTOTAL, CKMB, CKMBINDEX, TROPONINI in the last 168 hours. BNP (last 3 results) No results for input(s): PROBNP in the last 8760 hours. HbA1C: No results for input(s): HGBA1C in the last 72 hours. CBG: Recent Labs  Lab 01/03/20 0819 01/03/20 1222 01/03/20 1643 01/03/20 2107 01/04/20 0618  GLUCAP 99 214* 335* 96 98   Lipid Profile: No results for input(s): CHOL, HDL, LDLCALC, TRIG, CHOLHDL, LDLDIRECT in the last 72 hours. Thyroid Function Tests: No results for input(s): TSH, T4TOTAL, FREET4, T3FREE, THYROIDAB in the last 72 hours. Anemia Panel: No results for input(s): VITAMINB12, FOLATE, FERRITIN, TIBC, IRON, RETICCTPCT in the last 72 hours. Sepsis Labs: No results for input(s): PROCALCITON, LATICACIDVEN in the last 168 hours.  Recent Results (from the past 240 hour(s))  SARS Coronavirus 2 by RT PCR (hospital order, performed in [facility] hospital lab) Nasopharyngeal Nasopharyngeal Swab     Status: None   Collection Time: 12/31/19  2:29 PM   Specimen:  Nasopharyngeal Swab  Result Value Ref Range Status   SARS Coronavirus 2 NEGATIVE NEGATIVE Final    Comment: (NOTE) SARS-CoV-2 target nucleic acids are NOT DETECTED.  The SARS-CoV-2 RNA is generally detectable in upper and lower respiratory specimens during the acute phase of infection. The lowest concentration of SARS-CoV-2 viral copies this assay can detect is 250 copies / mL. A negative result does not preclude SARS-CoV-2 infection and should not be used as the sole basis for treatment or other patient management decisions.  A negative result may occur with improper specimen collection / handling, submission of specimen other than nasopharyngeal swab, presence of viral mutation(s) within the areas targeted by this assay, and inadequate number of viral copies (<250 copies / mL). A negative result must be combined with clinical observations, patient history,  and epidemiological information.  Fact Sheet for Patients:   StrictlyIdeas.no  Fact Sheet for Healthcare Providers: BankingDealers.co.za  This test is not yet approved or  cleared by the [place] FDA and has been authorized for detection and/or diagnosis of SARS-CoV-2 by FDA under an Emergency Use Authorization (EUA).  This EUA will remain in effect (meaning this test can be used) for the duration of the COVID-19 declaration under Section 564(b)(1) of the Act, 21 U.S.C. section 360bbb-3(b)(1), unless the authorization is terminated or revoked sooner.  Performed at [facility], [place] [address] [place], [place] [address]   MRSA PCR Screening     Status: None   Collection Time: 01/01/20  9:25 PM   Specimen: Nasal Mucosa; Nasopharyngeal  Result Value Ref Range Status   MRSA by PCR NEGATIVE NEGATIVE Final    Comment:        The GeneXpert MRSA Assay (FDA approved for NASAL specimens only), is one component of a comprehensive MRSA colonization surveillance program. It  is not intended to diagnose MRSA infection nor to guide or monitor treatment for MRSA infections. Performed at [facility], [place] [address] [place], [place] [address]          Radiology Studies: CT HEAD W & WO CONTRAST  Result Date: 01/03/2020 CLINICAL DATA:  Ataxia.  Stroke.  Mass lesion identified on CT. EXAM: CT HEAD WITHOUT AND WITH CONTRAST TECHNIQUE: Contiguous axial images were obtained from the base of the skull through the vertex without and with intravenous contrast CONTRAST:  45mL OMNIPAQUE IOHEXOL 300 MG/ML  SOLN COMPARISON:  CT head and CTA head 12/31/2019 FINDINGS: Brain: Advanced atrophy. Extensive chronic microvascular ischemic changes in the white matter bilaterally. No acute cortical infarct. No acute hemorrhage 11 x 12 mm enhancing mass lesion in the left prefrontal cortex. This appears intra-axial and has central necrosis. Probable metastatic deposit. No other enhancing lesions identified. Vascular: Negative for hyperdense vessel. Atherosclerotic calcification in the cavernous carotid bilaterally. Normal venous enhancement. Skull: Negative Sinuses/Orbits: Depressed fracture left orbital floor. This was not present on the CT of 06/25/2019. Correlate with symptoms of recent injury. Bilateral cataract extraction. Mild mucosal edema paranasal sinuses. Other: None IMPRESSION: 11 x 12 mm enhancing mass left prefrontal cortex compatible with metastatic disease. Extensive atrophy. Extensive chronic microvascular ischemic change in the white matter Depressed fracture left orbital floor. Correlate with symptoms and injury. Electronically Signed   By: Franchot Gallo M.D.   On: 01/03/2020 10:13        Scheduled Meds: .  stroke: mapping our early stages of recovery book   Does not apply Once  . ammonium lactate  1 application Topical QHS  . aspirin  300 mg Rectal Daily   Or  . aspirin EC  325 mg Oral Daily  . atorvastatin  10 mg Oral Daily  . donepezil  10 mg Oral QHS  .  heparin  5,000 Units Subcutaneous Q12H  . insulin aspart  0-9 Units Subcutaneous TID WC  . lactose free nutrition  237 mL Oral BID BM  . levothyroxine  88 mcg Oral Q0600   Continuous Infusions:    LOS: 4 days     Georgette Shell, MD 01/04/2020, 9:34 AM

## 2020-01-05 LAB — GLUCOSE, CAPILLARY
Glucose-Capillary: 208 mg/dL — ABNORMAL HIGH (ref 70–99)
Glucose-Capillary: 120 mg/dL — ABNORMAL HIGH (ref 70–99)
Glucose-Capillary: 300 mg/dL — ABNORMAL HIGH (ref 70–99)

## 2020-01-05 NOTE — NC FL2 (Deleted)
[place] LEVEL OF CARE SCREENING TOOL     IDENTIFICATION  Patient Name: Robert Salinas. Birthdate: 1927-03-28 Sex: male Admission Date (Current Location): 12/31/2019  [facility] and [place] Number:  Herbalist and Address:  [facility], [place] [address] [place], [place] [address]      Provider Number: [id number]  Attending Physician Name and Address:  Georgette Shell, MD  Relative Name and Phone Number:       Current Level of Care: Hospital Recommended Level of Care: Memory Care Prior Approval Number:    Date Approved/Denied:   PASRR Number:    Discharge Plan: Other (Comment) (Memory Care)    Current Diagnoses: Patient Active Problem List   Diagnosis Date Noted  . Stenosis of left carotid artery   . Cerebral embolism with cerebral infarction 12/31/2019  . TIA (transient ischemic attack) 12/31/2019  . Transient alteration of awareness   . UTI (urinary tract infection) 03/20/2017  . DNR (do not resuscitate) 12/08/2013  . Hypothyroid 10/09/2011  . CKD (chronic kidney disease), stage III 02/21/2011  . Diabetes mellitus type II, controlled ([place]) 06/09/2009  . Hyperlipidemia 06/09/2009  . ALZHEIMER'S DISEASE, EARLY 06/09/2009  . Essential hypertension 06/09/2009  . Atrial fibrillation ([place]) 06/09/2009    Orientation RESPIRATION BLADDER Height & Weight     Self  Normal Incontinent Weight: 163 lb 6.4 oz (74.1 kg) Height:  5\' 10"  (177.8 cm)  BEHAVIORAL SYMPTOMS/MOOD NEUROLOGICAL BOWEL NUTRITION STATUS      Incontinent Diet (regular)  AMBULATORY STATUS COMMUNICATION OF NEEDS Skin   Limited Assist Verbally Normal                       Personal Care Assistance Level of Assistance  Bathing, Feeding, Dressing Bathing Assistance: Limited assistance Feeding assistance: Limited assistance Dressing Assistance: Limited assistance     Functional Limitations Info  Speech     Speech Info: Impaired (dysarthria)     SPECIAL CARE FACTORS FREQUENCY  PT (By licensed PT), OT (By licensed OT)     PT Frequency: 3x/wk with HH OT Frequency: 3x/wk with HH            Contractures Contractures Info: Not present    Additional Factors Info  Code Status, Allergies, Psychotropic Code Status Info: DNR Allergies Info: NKA Psychotropic Info: Aricept 10mg  daily         Current Medications (01/05/2020):  This is the current hospital active medication list Current Facility-Administered Medications  Medication Dose Route Frequency Provider Last Rate Last Admin  .  stroke: mapping our early stages of recovery book   Does not apply Once Wynetta Fines T, MD      . acetaminophen (TYLENOL) tablet 650 mg  650 mg Oral Q4H PRN Lequita Halt, MD       Or  . acetaminophen (TYLENOL) 160 MG/5ML solution 650 mg  650 mg Per Tube Q4H PRN Wynetta Fines T, MD       Or  . acetaminophen (TYLENOL) suppository 650 mg  650 mg Rectal Q4H PRN Wynetta Fines T, MD      . ammonium lactate (LAC-HYDRIN) 12 % lotion 1 application  1 application Topical QHS Lequita Halt, MD   1 application at 09/32/35 2246  . aspirin suppository 300 mg  300 mg Rectal Daily Rosalin Hawking, MD   300 mg at 01/01/20 1052   Or  . aspirin EC tablet 325 mg  325 mg Oral  Daily Rosalin Hawking, MD   325 mg at 01/05/20 0859  . atorvastatin (LIPITOR) tablet 10 mg  10 mg Oral Daily Wynetta Fines T, MD   10 mg at 01/05/20 0859  . diclofenac Sodium (VOLTAREN) 1 % topical gel 2 g  2 g Topical TID PRN Wynetta Fines T, MD      . donepezil (ARICEPT) tablet 10 mg  10 mg Oral QHS Wynetta Fines T, MD   10 mg at 01/04/20 2244  . haloperidol (HALDOL) tablet 1 mg  1 mg Oral Q6H PRN Georgette Shell, MD       Or  . haloperidol lactate (HALDOL) injection 1 mg  1 mg Intramuscular Q6H PRN Georgette Shell, MD      . heparin injection 5,000 Units  5,000 Units Subcutaneous Q12H Lequita Halt, MD   5,000 Units at 01/05/20 0859  . insulin aspart (novoLOG) injection 0-9 Units  0-9 Units  Subcutaneous TID WC Lequita Halt, MD   5 Units at 01/04/20 1718  . ipratropium-albuterol (DUONEB) 0.5-2.5 (3) MG/3ML nebulizer solution 3 mL  3 mL Inhalation Q6H PRN Wynetta Fines T, MD      . lactose free nutrition (Boost) liquid 237 mL  237 mL Oral BID BM Wynetta Fines T, MD   237 mL at 01/05/20 0900  . levothyroxine (SYNTHROID) tablet 88 mcg  88 mcg Oral Q0600 Georgette Shell, MD   88 mcg at 01/05/20 0557  . senna-docusate (Senokot-S) tablet 1 tablet  1 tablet Oral QHS PRN Lequita Halt, MD         Discharge Medications: STOP taking these medications   aspirin 81 MG tablet   atorvastatin 10 MG tablet Commonly known as: LIPITOR   cephALEXin 500 MG capsule Commonly known as: KEFLEX   donepezil 10 MG tablet Commonly known as: ARICEPT   levofloxacin 750 MG tablet Commonly known as: Levaquin   metFORMIN 500 MG tablet Commonly known as: GLUCOPHAGE   Vitamin D3 50 MCG (2000 UT) Tabs     TAKE these medications   acetaminophen 325 MG tablet Commonly known as: TYLENOL Take 2 tablets (650 mg total) by mouth every 4 (four) hours as needed for mild pain (or temp > 37.5 C (99.5 F)).   amLODipine 2.5 MG tablet Commonly known as: NORVASC Take 1 tablet (2.5 mg total) by mouth daily. What changed: when to take this   ammonium lactate 12 % lotion Commonly known as: LAC-HYDRIN Apply 1 application topically See admin instructions. Apply to both feet at bedtime   Combivent Respimat 20-100 MCG/ACT Aers respimat Generic drug: Ipratropium-Albuterol Inhale 1 puff into the lungs in the morning and at bedtime.   glucose blood test strip Commonly known as: OneTouch Verio Use to test blood sugars daily. Dx: E11.9   lactose free nutrition Liqd Take 237 mLs by mouth 2 (two) times daily between meals.   levothyroxine 88 MCG tablet Commonly known as: SYNTHROID Take 88 mcg by mouth daily before breakfast.   montelukast 10 MG tablet Commonly known as: SINGULAIR Take 10 mg  by mouth daily.   onetouch ultrasoft lancets Use to test blood sugars daily. Dx: E11.9   ProAir HFA 108 (90 Base) MCG/ACT inhaler Generic drug: albuterol Inhale 2 puffs into the lungs 4 (four) times daily as needed for wheezing or shortness of breath.   Robafen DM Cgh/Chest Congest 10-100 MG/5ML liquid Generic drug: dextromethorphan-guaiFENesin Take 5 mLs by mouth 3 (three) times daily as needed for cough.  Voltaren 1 % Gel Generic drug: diclofenac Sodium Apply 2 g topically 3 (three) times daily as needed (for pain- RIGHT ELBOW).      Relevant Imaging Results:  Relevant Lab Results:   Additional Information SS#: [id number]  Geralynn Ochs, LCSW

## 2020-01-05 NOTE — TOC Transition Note (Signed)
Transition of Care [facility]) - CM/SW Discharge Note   Patient Details  Name: Aquilla Voiles. MRN: [id number] Date of Birth: 05-Oct-1926  [facility] of [facility]) CM/SW Contact:  Geralynn Ochs, LCSW Phone Number: 01/05/2020, 11:23 AM   Clinical Narrative:   Nurse to call report to [phone].  Palliative care referral given to [facility].    Final next level of care: Memory Care Barriers to Discharge: Barriers Resolved   Patient Goals and CMS Choice Patient states their goals for this hospitalization and ongoing recovery are:: patient unable to participate in goal setting due to disorientation CMS Medicare.gov Compare Post Acute Care list provided to:: Patient Represenative (must comment) Choice offered to / list presented to : Adult Children  Discharge Placement              Patient chooses bed at: [facility] Patient to be transferred to facility by: [facility] Name of family member notified: [name] Patient and family notified of of transfer: 01/05/20  Discharge Plan and Services     Post Acute Care Choice: Home Health, Nursing Home                    HH Arranged: RN, PT, OT [facility] Agency: [facility] Date [place]: 01/02/20   Representative spoke with at [facility]: [place] ([place]) Interventions     Readmission Risk Interventions No flowsheet data found.

## 2020-01-13 ENCOUNTER — Other Ambulatory Visit: Payer: Self-pay

## 2020-01-13 ENCOUNTER — Non-Acute Institutional Stay: Payer: Medicare Other | Admitting: Hospice

## 2020-01-13 DIAGNOSIS — Z515 Encounter for palliative care: Secondary | ICD-10-CM

## 2020-01-13 NOTE — Progress Notes (Addendum)
[facility] Consult Note Telephone: [phone]  Fax: [phone]  PATIENT NAME: Robert Salinas. DOB: 1926-11-19 MRN: [id number]  PRIMARY CARE PROVIDER:  Dr. Reymundo Salinas   REFERRING PROVIDER: Dr. Reymundo Salinas   RESPONSIBLE PARTY:   Patient's daughter in law Robert Salinas or [phone]    RECOMMENDATIONS/PLAN:   [place] Planning/Goals of Care: Visit at the request of  Dr. Reymundo Salinas   for palliative consult. Visit consisted of building trust and discussions on Palliative Medicine as specialized medical care for people living with serious illness, aimed at facilitating better quality of life through symptoms relief, assisting with advance care plan and establishing goals of care.  Patient with limited understanding and communication due to dementia.   Facility records indicate patient is a DO NOT RESUSCITATE.  DNR form uploaded to epic today.  Goals of care include to maximize quality of life and symptom management.  NP called Robert Salinas with the two tel numbers above and left Voicemails with callback number.  Robert Salinas later called back and affirmed that patient is a DO NOT RESUSCITATE, with no desires for aggressive treatments.  She would meet up with NP at the facility during next visit to further discuss and sign the MOST form. Palliative care will continue to provide support to patient, family and the medical team. Follow up: Palliative care will continue to follow patient for goals of care clarification and symptom management.  Follow-up visit in 6 weeks. Symptom management: Patient was recently hospitalized 12/31/2019/19/2021 For cerebral embolism with cerebral infarction.  Chart review of that encounter indicates that family does not want any aggressive measures; wants patient to be kept comfortable.  Ongoing memory loss/confusion related to dementia.  Fast 6D, FLACC 0.  Patient is incontinent of bowel and bladder, a high  fall risk, gets around mostly with his wheelchair.  Nursing reports he is compliant with his medications, feeds self after set up.  Education on falls precaution.  Patient in no acute distress, denies pain/discomfort.  No coughing, no respiratory distress.  Nursing with no acute concerns.  Continue ongoing nursing care. I spent 1 hour and 16 minutes providing this initial consultation; time iincludes time spent with patient/family, chart review, provider coordination,  and documentation. More than 50% of the time in this consultation was spent on coordinating communication  HISTORY OF PRESENT ILLNESS:  Robert Salinas. is a 86 y.o. year old male with multiple medical problems including recent cerebral embolism with cerebral infarction, TIA, dysphagia secondary to stroke Alzheimer's dementia, hypertension. Palliative Care was asked to help address goals of care.   CODE STATUS: DNR  PPS: 40% HOSPICE ELIGIBILITY/DIAGNOSIS: TBD  PAST MEDICAL HISTORY:  Past Medical History:  Diagnosis Date  . Alzheimer disease ([place])   . Atrial fibrillation ([place])   . BPH (benign prostatic hyperplasia)   . Chickenpox   . Chronic kidney disease   . COLONIC POLYPS, HX OF 06/09/2009  . Dementia ([place])   . Diabetes mellitus   . Hx of colonic polyps   . Hyperlipidemia   . Hypertension   . Hypothyroidism   . Hypothyroidism   . Thrombocytopenia ([place])     SOCIAL HX:  Social History   Tobacco Use  . Smoking status: Never Smoker  . Smokeless tobacco: Never Used  Substance Use Topics  . Alcohol use: No    ALLERGIES: No Known Allergies   PERTINENT MEDICATIONS:  Outpatient Encounter Medications as of 01/13/2020  Medication Sig  .  acetaminophen (TYLENOL) 325 MG tablet Take 2 tablets (650 mg total) by mouth every 4 (four) hours as needed for mild pain (or temp > 37.5 C (99.5 F)).  Marland Kitchen albuterol (PROAIR HFA) 108 (90 Base) MCG/ACT inhaler Inhale 2 puffs into the lungs 4 (four) times daily as needed for wheezing or  shortness of breath.  Marland Kitchen amLODipine (NORVASC) 2.5 MG tablet Take 1 tablet (2.5 mg total) by mouth daily. (Patient taking differently: Take 2.5 mg by mouth at bedtime. )  . ammonium lactate (LAC-HYDRIN) 12 % lotion Apply 1 application topically See admin instructions. Apply to both feet at bedtime  . dextromethorphan-guaiFENesin (ROBAFEN DM CGH/CHEST CONGEST) 10-100 MG/5ML liquid Take 5 mLs by mouth 3 (three) times daily as needed for cough.  . diclofenac Sodium (VOLTAREN) 1 % GEL Apply 2 g topically 3 (three) times daily as needed (for pain- RIGHT ELBOW).  Marland Kitchen glucose blood (ONETOUCH VERIO) test strip Use to test blood sugars daily. Dx: E11.9  . Ipratropium-Albuterol (COMBIVENT RESPIMAT) 20-100 MCG/ACT AERS respimat Inhale 1 puff into the lungs in the morning and at bedtime.  . lactose free nutrition (BOOST) LIQD Take 237 mLs by mouth 2 (two) times daily between meals.  . Lancets (ONETOUCH ULTRASOFT) lancets Use to test blood sugars daily. Dx: E11.9  . levothyroxine (SYNTHROID) 88 MCG tablet Take 88 mcg by mouth daily before breakfast.  . montelukast (SINGULAIR) 10 MG tablet Take 10 mg by mouth daily.   No facility-administered encounter medications on file as of 01/13/2020.    PHYSICAL EXAM/ROS:  General: NAD, cooperative Cardiovascular: regular rate and rhythm; denies chest pain Pulmonary: clear ant/post fields Abdomen: soft, nontender, + bowel sounds GU: no suprapubic tenderness Extremities: no edema, no joint deformities Skin: no rashes to exposed skin Neurological: Weakness but otherwise nonfocal  Teodoro Spray, NP

## 2020-02-10 ENCOUNTER — Ambulatory Visit (INDEPENDENT_AMBULATORY_CARE_PROVIDER_SITE_OTHER): Payer: Medicare Other | Admitting: Adult Health

## 2020-02-10 ENCOUNTER — Other Ambulatory Visit: Payer: Self-pay

## 2020-02-10 ENCOUNTER — Encounter: Payer: Self-pay | Admitting: Adult Health

## 2020-02-10 VITALS — BP 130/69 | HR 69 | Ht 70.0 in

## 2020-02-10 DIAGNOSIS — E782 Mixed hyperlipidemia: Secondary | ICD-10-CM | POA: Diagnosis not present

## 2020-02-10 DIAGNOSIS — G9389 Other specified disorders of brain: Secondary | ICD-10-CM | POA: Diagnosis not present

## 2020-02-10 DIAGNOSIS — Z515 Encounter for palliative care: Secondary | ICD-10-CM

## 2020-02-10 DIAGNOSIS — I10 Essential (primary) hypertension: Secondary | ICD-10-CM

## 2020-02-10 DIAGNOSIS — I480 Paroxysmal atrial fibrillation: Secondary | ICD-10-CM

## 2020-02-10 DIAGNOSIS — E119 Type 2 diabetes mellitus without complications: Secondary | ICD-10-CM

## 2020-02-10 NOTE — Progress Notes (Signed)
[facility] [address] [place] [address] [phone]       HOSPITAL FOLLOW UP NOTE  [name]. Date of Birth:  November 22, 1926 Medical Record Number:  [id number]   Reason for Referral:  hospital stroke follow up    SUBJECTIVE:   CHIEF COMPLAINT:  Chief Complaint  Patient presents with  . Follow-up    Per nurse, he has been doing well since discharge  . room 5    with nurse    HPI:   [name]. is a 86 y.o. male with history of f alzheimer's dementia, hypothyroidism, remote AFib on ASA, well-controlled T2DM, and HTN presented on 12/31/2019 with aphasia and altered mental status.  Initially planned to administer TPA but held due to concern for CT showing left frontal metastasis raising concern for possible seizure as cause of confusion.  Attempted MRI w/wo contrast but uncooperative.  CT w/wo showed 11 x 12 mm enhancing mass left prefrontal cortex compatible with metastatic disease as well as depressed fracture left orbital floor.  CTA head/neck right ICA origin 70% stenosis, proximal left ICA short near occlusion, proximal supraclinoid ICA moderate to severe stenosis and left A1 severe stenosis.  Patient not a candidate for CEA or CAS due to aphasia and dementia and recommended follow-up outpatient with [facility].  EEG suggestive of moderate diffuse encephalopathy but no definite seizures or epileptiform discharges.  Recommended aspirin 325 mg daily.  History of PAF but not AC candidate due to fall risk as well as possible intracranial mass.  History of HTN on Norvasc and long-term BP goal 130-150 given bilateral ICA stenosis.  LDL 122 recommended continuation of atorvastatin 10 mg daily.  Uncontrolled DM with A1c 7.8.  Other stroke risk factors include advanced age.  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  Given  advanced age and poor medical condition, family decided not to pursue aggressive measures and agreed to palliative care and comfort measures only.  He was discharged back to [facility]   Altered mental status and confusion with CT scan x2 - for stroke but showing left frontal metastasis raising concern for possible seizure as the cause of his confusion.    Code Stroke CT head No acute abnormality. Possible small hyperdense L precentral gyrus mass. ASPECTS 10.     CTA head & neck no LVO.  R ICA origin 70%. Proximal L ICA short near occlusion. Proximal supraclinoid ICA moderate to severe.  L A1 severe stenosis. Possible small mass L precentral gyrus.   CT perfusion no core. L MCA, L MCA/PCA watershed territory at risk  MRI w/w/o not cooperative  CT w/wo 7/17 - 11 x 12 mm enhancing mass left prefrontal cortex compatible with metastatic disease. Depressed fracture left orbital floor. Correlate with symptoms and injury.  2D Echo EF 60-65%. No source of embolus   EEG 01/03/20 - suggestive of moderate diffuse encephalopathy, nonspecific etiology.No seizures or epileptiform discharges were seen throughout the recording.  LDL 122  HgbA1c 7.8  VTE prophylaxis - Heparin 5000 units sq tid   aspirin 81 mg daily prior to admission, now on ASA 325mg . Continue on discharge. Pt not AC candidate now (see below) -not continued at discharge as family declined aggressive measures  Therapy recommendations:  HH therapies vs SNF  Disposition: [facility]  Today, 02/10/2020, Robert Salinas is being seen for hospital follow-up accompanied  by a nurse from [facility] Unfortunately, she is not too familiar with his care.  Visit limited due to cognitive impairment from Alzheimer's dementia  Nurse unable to state if residual speech difficulties or worsening cognitive impairment When patient questioned about residual speech difficulties, he declines but also declines recently having  a stroke She states he has been stable since he arrived to facility She did call nurse at facility who verified that he is on palliative care.  Not currently working with therapy  Blood pressure today 130/89 Remains on amlodipine 2.5 mg Not currently on antithrombotic or statin due to comfort measures  No concerns at this time    ROS:   Unable to obtain due to cognitive impairment  PMH:  Past Medical History:  Diagnosis Date  . Alzheimer disease ([place])   . Atrial fibrillation ([place])   . BPH (benign prostatic hyperplasia)   . Chickenpox   . Chronic kidney disease   . COLONIC POLYPS, HX OF 06/09/2009  . Dementia ([place])   . Diabetes mellitus   . Hx of colonic polyps   . Hyperlipidemia   . Hypertension   . Hypothyroidism   . Hypothyroidism   . Thrombocytopenia ([facility])     PSH:  Past Surgical History:  Procedure Laterality Date  . APPENDECTOMY    . PROSTATECTOMY     BPH cause?    Social History:  Social History   Socioeconomic History  . Marital status: Married    Spouse name: Not on file  . Number of children: Not on file  . Years of education: Not on file  . Highest education level: Not on file  Occupational History  . Not on file  Tobacco Use  . Smoking status: Never Smoker  . Smokeless tobacco: Never Used  Substance and Sexual Activity  . Alcohol use: No  . Drug use: No  . Sexual activity: Not on file  Other Topics Concern  . Not on file  Social History Narrative   Married. 4 children. 4 grandkids-one died at 51. 1 greatgrandchild.    Lives with wife.       Retired from [employer]   Social Determinants of Radio broadcast assistant Strain:   . Difficulty of Paying Living Expenses: Not on file  Food Insecurity:   . Worried About Charity fundraiser in the Last Year: Not on file  . Ran Out of Food in the Last Year: Not on file  Transportation Needs:   . Lack of Transportation (Medical): Not on file  . Lack of Transportation (Non-Medical): Not on file    Physical Activity:   . Days of Exercise per Week: Not on file  . Minutes of Exercise per Session: Not on file  Stress:   . Feeling of Stress : Not on file  Social Connections:   . Frequency of Communication with Friends and Family: Not on file  . Frequency of Social Gatherings with Friends and Family: Not on file  . Attends Religious Services: Not on file  . Active Member of Clubs or Organizations: Not on file  . Attends Archivist Meetings: Not on file  . Marital Status: Not on file  Intimate Partner Violence:   . Fear of Current or Ex-Partner: Not on file  . Emotionally Abused: Not on file  . Physically Abused: Not on file  . Sexually Abused: Not on file    Family History: No family history on file.  Medications:   Current  Outpatient Medications on File Prior to Visit  Medication Sig Dispense Refill  . acetaminophen (TYLENOL) 325 MG tablet Take 2 tablets (650 mg total) by mouth every 4 (four) hours as needed for mild pain (or temp > 37.5 C (99.5 F)).    Marland Kitchen albuterol (PROAIR HFA) 108 (90 Base) MCG/ACT inhaler Inhale 2 puffs into the lungs 4 (four) times daily as needed for wheezing or shortness of breath.    Marland Kitchen amLODipine (NORVASC) 2.5 MG tablet Take 1 tablet (2.5 mg total) by mouth daily. (Patient taking differently: Take 2.5 mg by mouth at bedtime. ) 90 tablet 3  . ammonium lactate (LAC-HYDRIN) 12 % lotion Apply 1 application topically See admin instructions. Apply to both feet at bedtime    . dextromethorphan-guaiFENesin (ROBAFEN DM CGH/CHEST CONGEST) 10-100 MG/5ML liquid Take 5 mLs by mouth 3 (three) times daily as needed for cough.    . diclofenac Sodium (VOLTAREN) 1 % GEL Apply 2 g topically 3 (three) times daily as needed (for pain- RIGHT ELBOW).    Marland Kitchen glucose blood (ONETOUCH VERIO) test strip Use to test blood sugars daily. Dx: E11.9 100 each 12  . Ipratropium-Albuterol (COMBIVENT RESPIMAT) 20-100 MCG/ACT AERS respimat Inhale 1 puff into the lungs in the morning and  at bedtime.    . lactose free nutrition (BOOST) LIQD Take 237 mLs by mouth 2 (two) times daily between meals.    . Lancets (ONETOUCH ULTRASOFT) lancets Use to test blood sugars daily. Dx: E11.9 100 each 12  . levothyroxine (SYNTHROID) 88 MCG tablet Take 88 mcg by mouth daily before breakfast.    . montelukast (SINGULAIR) 10 MG tablet Take 10 mg by mouth daily.     No current facility-administered medications on file prior to visit.    Allergies:  No Known Allergies    OBJECTIVE:  Physical Exam  Vitals:   02/10/20 1420  BP: 130/69  Pulse: 69  Height: 5\' 10"  (1.778 m)   Body mass index is 23.45 kg/m. No exam data present  General: Frail pleasant elderly Caucasian male, seated, in no evident distress Head: head normocephalic and atraumatic.   Neck: supple with no carotid or supraclavicular bruits Cardiovascular: regular rate and rhythm, no murmurs Musculoskeletal: no deformity Skin:  no rash/petichiae Vascular:  Normal pulses all extremities   Neurologic Exam Mental Status: Drowsy but will awaken easily.  Able to state name only.  Difficulty fully assessing cognition with possible underlying aphasia and severe cognitive impairment.  Able to follow simple step commands.  Mood and affect appropriate.  Cranial Nerves: Fundoscopic exam reveals sharp disc margins. Pupils equal, briskly reactive to light. Extraocular movements full without nystagmus. Visual fields blink to threat bilaterally.  HOH bilaterally. Facial sensation intact. Face, tongue, palate moves normally and symmetrically.  Motor: Normal bulk and tone. Normal strength in all tested extremity muscles. Sensory.: intact to touch , pinprick , position and vibratory sensation.  Coordination: Rapid alternating movements normal in all extremities. Finger-to-nose and heel-to-shin unable to assess due to patient not understanding directions Gait and Station: Deferred Reflexes: 1+ and symmetric. Toes downgoing.         ASSESSMENT/PLAN: Robert Salinas. is a 86 y.o. year old male presented with aphasia and altered mental status on 12/31/2019 initially undergoing stroke work-up but imaging showed evidence of 11 x 12 mm enhancing mass of left prefrontal cortex compatible with metastatic disease.  CT perfusion showed at risk left MCA and left MCA/PCA watershed territory likely secondary to high-grade left ICA stenosis.  Vascular risk factors include left ICA high-grade stenosis, PAF not on AC, HTN, HLD, DM and advanced age.  Prior to discharge, family agreed to palliative care and comfort measures only   Currently on comfort measures through palliative care therefore aspirin and statin discontinued at hospital discharge Difficulty with today's visit due to patient's cognitive impairment with Alzheimer's dementia and the nurse present only able to provide limited information Being followed by palliative care at [facility]   No further recommendations or need for routine neurological follow-up and may follow-up on an as-needed basis   I spent 40 minutes of face-to-face and non-face-to-face time with patient and nurse.  This included previsit chart review, lab review, study review, order entry, electronic health record documentation, and discussion with nurse regarding recent hospitalization with complete review, attempted assessment, review of med list provided by facility and answered all questions to satisfaction     Frann Rider, [facility]  [facility] [address] [place], [place] [address]  Phone [phone] Fax [phone] Note: This document was prepared with digital dictation and possible smart phrase technology. Any transcriptional errors that result from this process are unintentional.

## 2020-02-10 NOTE — Patient Instructions (Addendum)
Your Plan:  Continue current treatment plan  Due to being followed by palliative care, no further recommendations from our standpoint   May follow up on a as needed basis     Thank you for coming to see [place] at [facility]. I hope we have been able to provide you high quality care today.  You may receive a patient satisfaction survey over the next few weeks. We would appreciate your feedback and comments so that we may continue to improve ourselves and the health of our patients.

## 2020-02-17 NOTE — Progress Notes (Signed)
I agree with the above plan 

## 2020-03-18 ENCOUNTER — Encounter (HOSPITAL_COMMUNITY): Payer: Self-pay

## 2020-03-18 ENCOUNTER — Emergency Department (HOSPITAL_COMMUNITY): Payer: Medicare Other

## 2020-03-18 ENCOUNTER — Inpatient Hospital Stay (HOSPITAL_COMMUNITY)
Admission: EM | Admit: 2020-03-18 | Discharge: 2020-03-19 | DRG: 690 | Disposition: A | Discharge status: Skilled Nursing Facility | Payer: Medicare Other | Source: Skilled Nursing Facility | Attending: Family Medicine | Admitting: Family Medicine

## 2020-03-18 DIAGNOSIS — N1832 Chronic kidney disease, stage 3b: Secondary | ICD-10-CM | POA: Diagnosis present

## 2020-03-18 DIAGNOSIS — I129 Hypertensive chronic kidney disease with stage 1 through stage 4 chronic kidney disease, or unspecified chronic kidney disease: Secondary | ICD-10-CM | POA: Diagnosis present

## 2020-03-18 DIAGNOSIS — Z6823 Body mass index (BMI) 23.0-23.9, adult: Secondary | ICD-10-CM

## 2020-03-18 DIAGNOSIS — Z86010 Personal history of colonic polyps: Secondary | ICD-10-CM | POA: Diagnosis not present

## 2020-03-18 DIAGNOSIS — E1122 Type 2 diabetes mellitus with diabetic chronic kidney disease: Secondary | ICD-10-CM | POA: Diagnosis present

## 2020-03-18 DIAGNOSIS — I4821 Permanent atrial fibrillation: Secondary | ICD-10-CM | POA: Diagnosis present

## 2020-03-18 DIAGNOSIS — E782 Mixed hyperlipidemia: Secondary | ICD-10-CM | POA: Diagnosis not present

## 2020-03-18 DIAGNOSIS — Z515 Encounter for palliative care: Secondary | ICD-10-CM

## 2020-03-18 DIAGNOSIS — Z9049 Acquired absence of other specified parts of digestive tract: Secondary | ICD-10-CM

## 2020-03-18 DIAGNOSIS — Z79890 Hormone replacement therapy (postmenopausal): Secondary | ICD-10-CM | POA: Diagnosis not present

## 2020-03-18 DIAGNOSIS — E039 Hypothyroidism, unspecified: Secondary | ICD-10-CM | POA: Diagnosis present

## 2020-03-18 DIAGNOSIS — Z794 Long term (current) use of insulin: Secondary | ICD-10-CM

## 2020-03-18 DIAGNOSIS — F0280 Dementia in other diseases classified elsewhere without behavioral disturbance: Secondary | ICD-10-CM

## 2020-03-18 DIAGNOSIS — Z8673 Personal history of transient ischemic attack (TIA), and cerebral infarction without residual deficits: Secondary | ICD-10-CM

## 2020-03-18 DIAGNOSIS — R627 Adult failure to thrive: Secondary | ICD-10-CM | POA: Diagnosis present

## 2020-03-18 DIAGNOSIS — N400 Enlarged prostate without lower urinary tract symptoms: Secondary | ICD-10-CM | POA: Diagnosis present

## 2020-03-18 DIAGNOSIS — Z9079 Acquired absence of other genital organ(s): Secondary | ICD-10-CM | POA: Diagnosis not present

## 2020-03-18 DIAGNOSIS — E785 Hyperlipidemia, unspecified: Secondary | ICD-10-CM | POA: Diagnosis present

## 2020-03-18 DIAGNOSIS — Z7189 Other specified counseling: Secondary | ICD-10-CM

## 2020-03-18 DIAGNOSIS — I10 Essential (primary) hypertension: Secondary | ICD-10-CM | POA: Diagnosis present

## 2020-03-18 DIAGNOSIS — Z66 Do not resuscitate: Secondary | ICD-10-CM | POA: Diagnosis present

## 2020-03-18 DIAGNOSIS — E860 Dehydration: Secondary | ICD-10-CM | POA: Diagnosis present

## 2020-03-18 DIAGNOSIS — F0390 Unspecified dementia without behavioral disturbance: Secondary | ICD-10-CM | POA: Diagnosis present

## 2020-03-18 DIAGNOSIS — F05 Delirium due to known physiological condition: Secondary | ICD-10-CM | POA: Diagnosis present

## 2020-03-18 DIAGNOSIS — G309 Alzheimer's disease, unspecified: Secondary | ICD-10-CM | POA: Diagnosis present

## 2020-03-18 DIAGNOSIS — N3000 Acute cystitis without hematuria: Secondary | ICD-10-CM | POA: Diagnosis not present

## 2020-03-18 DIAGNOSIS — R4182 Altered mental status, unspecified: Secondary | ICD-10-CM | POA: Diagnosis present

## 2020-03-18 DIAGNOSIS — N183 Chronic kidney disease, stage 3 unspecified: Secondary | ICD-10-CM | POA: Diagnosis present

## 2020-03-18 DIAGNOSIS — Z79899 Other long term (current) drug therapy: Secondary | ICD-10-CM | POA: Diagnosis not present

## 2020-03-18 DIAGNOSIS — N3001 Acute cystitis with hematuria: Secondary | ICD-10-CM | POA: Diagnosis not present

## 2020-03-18 DIAGNOSIS — N390 Urinary tract infection, site not specified: Secondary | ICD-10-CM | POA: Diagnosis present

## 2020-03-18 DIAGNOSIS — Z20822 Contact with and (suspected) exposure to covid-19: Secondary | ICD-10-CM | POA: Diagnosis present

## 2020-03-18 DIAGNOSIS — G939 Disorder of brain, unspecified: Secondary | ICD-10-CM | POA: Diagnosis present

## 2020-03-18 DIAGNOSIS — R296 Repeated falls: Secondary | ICD-10-CM | POA: Diagnosis present

## 2020-03-18 LAB — CREATININE, SERUM
Creatinine, Ser: 1.69 mg/dL — ABNORMAL HIGH (ref 0.61–1.24)
GFR calc Af Amer: 40 mL/min — ABNORMAL LOW (ref >60)
GFR calc non Af Amer: 35 mL/min — ABNORMAL LOW (ref >60)

## 2020-03-18 LAB — CBC WITH DIFFERENTIAL/PLATELET
Abs Immature Granulocytes: 0.01 10*3/uL (ref 0.00–0.07)
Basophils Absolute: 0 10*3/uL (ref 0.0–0.1)
Basophils Relative: 1 %
Eosinophils Absolute: 0 10*3/uL (ref 0.0–0.5)
Eosinophils Relative: 0 %
HCT: 39.9 % (ref 39.0–52.0)
Hemoglobin: 12.7 g/dL — ABNORMAL LOW (ref 13.0–17.0)
Immature Granulocytes: 0 %
Lymphocytes Relative: 24 %
Lymphs Abs: 1.2 10*3/uL (ref 0.7–4.0)
MCH: 29.4 pg (ref 26.0–34.0)
MCHC: 31.8 g/dL (ref 30.0–36.0)
MCV: 92.4 fL (ref 80.0–100.0)
Monocytes Absolute: 0.4 10*3/uL (ref 0.1–1.0)
Monocytes Relative: 8 %
Neutro Abs: 3.3 10*3/uL (ref 1.7–7.7)
Neutrophils Relative %: 67 %
Platelets: 145 10*3/uL — ABNORMAL LOW (ref 150–400)
RBC: 4.32 MIL/uL (ref 4.22–5.81)
RDW: 14.3 % (ref 11.5–15.5)
WBC: 4.9 10*3/uL (ref 4.0–10.5)
nRBC: 0 % (ref 0.0–0.2)

## 2020-03-18 LAB — URINALYSIS, ROUTINE W REFLEX MICROSCOPIC
Bilirubin Urine: NEGATIVE
Glucose, UA: 50 mg/dL — AB
Ketones, ur: NEGATIVE mg/dL
Nitrite: NEGATIVE
Protein, ur: 100 mg/dL — AB
RBC / HPF: >50 RBC/hpf — ABNORMAL HIGH (ref 0–5)
Specific Gravity, Urine: 1.013 (ref 1.005–1.030)
WBC, UA: >50 WBC/hpf — ABNORMAL HIGH (ref 0–5)
pH: 5 (ref 5.0–8.0)

## 2020-03-18 LAB — RESPIRATORY PANEL BY RT PCR (FLU A&B, COVID)
Influenza A by PCR: NEGATIVE
Influenza B by PCR: NEGATIVE
SARS Coronavirus 2 by RT PCR: NEGATIVE

## 2020-03-18 LAB — LACTIC ACID, PLASMA
Lactic Acid, Venous: 1 mmol/L (ref 0.5–1.9)
Lactic Acid, Venous: 1.1 mmol/L (ref 0.5–1.9)

## 2020-03-18 LAB — COMPREHENSIVE METABOLIC PANEL
ALT: 18 U/L (ref 0–44)
AST: 20 U/L (ref 15–41)
Albumin: 3.6 g/dL (ref 3.5–5.0)
Alkaline Phosphatase: 109 U/L (ref 38–126)
Anion gap: 9 (ref 5–15)
BUN: 38 mg/dL — ABNORMAL HIGH (ref 8–23)
CO2: 25 mmol/L (ref 22–32)
Calcium: 9.3 mg/dL (ref 8.9–10.3)
Chloride: 105 mmol/L (ref 98–111)
Creatinine, Ser: 1.74 mg/dL — ABNORMAL HIGH (ref 0.61–1.24)
GFR calc Af Amer: 39 mL/min — ABNORMAL LOW (ref >60)
GFR calc non Af Amer: 34 mL/min — ABNORMAL LOW (ref >60)
Glucose, Bld: 152 mg/dL — ABNORMAL HIGH (ref 70–99)
Potassium: 4.8 mmol/L (ref 3.5–5.1)
Sodium: 139 mmol/L (ref 135–145)
Total Bilirubin: 1.2 mg/dL (ref 0.3–1.2)
Total Protein: 6.6 g/dL (ref 6.5–8.1)

## 2020-03-18 LAB — PHOSPHORUS: Phosphorus: 2.9 mg/dL (ref 2.5–4.6)

## 2020-03-18 LAB — CBC
HCT: 43.6 % (ref 39.0–52.0)
Hemoglobin: 13.5 g/dL (ref 13.0–17.0)
MCH: 29.5 pg (ref 26.0–34.0)
MCHC: 31 g/dL (ref 30.0–36.0)
MCV: 95.2 fL (ref 80.0–100.0)
Platelets: 150 10*3/uL (ref 150–400)
RBC: 4.58 MIL/uL (ref 4.22–5.81)
RDW: 14.2 % (ref 11.5–15.5)
WBC: 5.4 10*3/uL (ref 4.0–10.5)
nRBC: 0 % (ref 0.0–0.2)

## 2020-03-18 LAB — CBG MONITORING, ED: Glucose-Capillary: 139 mg/dL — ABNORMAL HIGH (ref 70–99)

## 2020-03-18 LAB — GLUCOSE, CAPILLARY
Glucose-Capillary: 135 mg/dL — ABNORMAL HIGH (ref 70–99)
Glucose-Capillary: 146 mg/dL — ABNORMAL HIGH (ref 70–99)

## 2020-03-18 LAB — TSH: TSH: 4.905 u[IU]/mL — ABNORMAL HIGH (ref 0.350–4.500)

## 2020-03-18 LAB — TROPONIN I (HIGH SENSITIVITY)
Troponin I (High Sensitivity): 9 ng/L (ref <18)
Troponin I (High Sensitivity): 9 ng/L (ref <18)

## 2020-03-18 LAB — MAGNESIUM: Magnesium: 2 mg/dL (ref 1.7–2.4)

## 2020-03-18 MED ORDER — INSULIN ASPART 100 UNIT/ML ~~LOC~~ SOLN: 0.0000 [IU] | Freq: Every day | SUBCUTANEOUS | Status: DC

## 2020-03-18 MED ORDER — SODIUM CHLORIDE 0.9 % IV SOLN
3.0000 g | Freq: Two times a day (BID) | INTRAVENOUS | Status: DC
  Administered 2020-03-18: 3 g via INTRAVENOUS
  Filled 2020-03-18 (×2): qty 8

## 2020-03-18 MED ORDER — SODIUM CHLORIDE 0.9 % IV SOLN
2.0000 g | INTRAVENOUS | Status: DC
  Administered 2020-03-19: 2 g via INTRAVENOUS
  Filled 2020-03-18 (×2): qty 20
  Filled 2020-03-18: qty 2

## 2020-03-18 MED ORDER — ONDANSETRON HCL 4 MG/2ML IJ SOLN: 4.0000 mg | Freq: Four times a day (QID) | INTRAMUSCULAR | Status: DC | PRN

## 2020-03-18 MED ORDER — HYDRALAZINE HCL 20 MG/ML IJ SOLN
10.0000 mg | Freq: Four times a day (QID) | INTRAMUSCULAR | Status: DC | PRN
  Administered 2020-03-18: 10 mg via INTRAVENOUS
  Filled 2020-03-18: qty 1

## 2020-03-18 MED ORDER — INSULIN ASPART 100 UNIT/ML ~~LOC~~ SOLN
0.0000 [IU] | Freq: Three times a day (TID) | SUBCUTANEOUS | Status: DC
  Administered 2020-03-19: 2 [IU] via SUBCUTANEOUS

## 2020-03-18 MED ORDER — ENOXAPARIN SODIUM 40 MG/0.4ML ~~LOC~~ SOLN
40.0000 mg | SUBCUTANEOUS | Status: DC
  Administered 2020-03-18: 40 mg via SUBCUTANEOUS
  Filled 2020-03-18: qty 0.4

## 2020-03-18 MED ORDER — ACETAMINOPHEN 325 MG PO TABS: 650.0000 mg | ORAL_TABLET | Freq: Four times a day (QID) | ORAL | Status: DC | PRN

## 2020-03-18 MED ORDER — ONDANSETRON HCL 4 MG PO TABS: 4.0000 mg | ORAL_TABLET | Freq: Four times a day (QID) | ORAL | Status: DC | PRN

## 2020-03-18 MED ORDER — ACETAMINOPHEN 650 MG RE SUPP: 650.0000 mg | Freq: Four times a day (QID) | RECTAL | Status: DC | PRN

## 2020-03-18 NOTE — ED Triage Notes (Signed)
Pt from [facility] via [facility]; staff report ams this am; LKW sometime last night; night shift didn't report any AMS yesterday; lethargic; alert to painful stimuli; confused at baseline, answers simple questions; staff reports pt not speaking today, not eating or drinking; stroke screen negative; VSS; staff reports brain tumor found during last hospital visit; hx uti, staff denies urinary symportoms  HR 60s sinus w/ fist degree block RR14 140/78

## 2020-03-18 NOTE — H&P (Signed)
History and Physical    Robert Salinas. YPP:[id number] DOB: 12-27-1926 DOA: 03/18/2020  PCP: Reymundo Poll, MD  Patient coming from: [facility]  I have personally briefly reviewed patient's old medical records in [place]  Chief Complaint: AMS  HPI: Robert Salinas. is a 86 y.o. male with medical history significant of Alzheimer's dementia, hypothyroidism, paroxysmal A. fib-on aspirin, insulin-dependent diabetes mellitus, hypertension, brain mass presents to emergency department for evaluation of altered mental status.  Patient is not a good historian.  History gathered from charts and patient's daughter-in-law.  Apparently patient had decreased appetite since 2 days and his blood sugar was higher than normal this morning.  He also seems more fatigued and more tired than normal.  No history of fever, chills, vomiting, diarrhea, cough, congestion, head trauma, seizures, loss of consciousness.  He is currently on palliative care.  Family does not want further imaging for brain mass.  They would like UTI to be treated and would consider options for hospice care once patient is back to baseline.  Of note: Patient admitted on 12/31/2019 with similar symptoms AMS secondary to UTI.  ED Course: Upon arrival to ED: Patient temperature noted to be 96.5, blood pressure elevated, CBC shows platelet of 145, CMP shows CKD-at baseline, UA positive for infection.  Troponin: Negative.  Chest x-ray, abdominal x-ray: Negative for acute findings, UC, BC, COVID-19 pending.  CT head shows interval increase in the size of hyperdense mass in the left frontal cortex now measuring up to 21 x 16 mm previously 11 x 12 mm.  MRI recommended.  Patient received Unasyn in ED for UTI.  [facility] hospitalist consulted for admission for AMS likely secondary to underlying UTI.  Review of Systems: As per HPI otherwise negative.    Past Medical History:  Diagnosis Date  . Alzheimer disease ([place])   . Atrial  fibrillation ([place])   . BPH (benign prostatic hyperplasia)   . Chickenpox   . Chronic kidney disease   . COLONIC POLYPS, HX OF 06/09/2009  . Dementia ([place])   . Diabetes mellitus   . Hx of colonic polyps   . Hyperlipidemia   . Hypertension   . Hypothyroidism   . Hypothyroidism   . Thrombocytopenia ([place])     Past Surgical History:  Procedure Laterality Date  . APPENDECTOMY    . PROSTATECTOMY     BPH cause?     reports that he has never smoked. He has never used smokeless tobacco. He reports that he does not drink alcohol and does not use drugs.  No Known Allergies  No family history on file.  Prior to Admission medications   Medication Sig Start Date End Date Taking? Authorizing Provider  acetaminophen (TYLENOL) 325 MG tablet Take 2 tablets (650 mg total) by mouth every 4 (four) hours as needed for mild pain (or temp > 37.5 C (99.5 F)). 01/03/20   Georgette Shell, MD  albuterol [facility] HFA) 108 [phone] Base) MCG/ACT inhaler Inhale 2 puffs into the lungs 4 (four) times daily as needed for wheezing or shortness of breath.    [provider]  amLODipine (NORVASC) 2.5 MG tablet Take 1 tablet (2.5 mg total) by mouth daily. Patient taking differently: Take 2.5 mg by mouth at bedtime.  03/15/17   [name], MD  ammonium lactate (LAC-HYDRIN) 12 % lotion Apply 1 application topically See admin instructions. Apply to both feet at bedtime 07/19/19   [provider]  dextromethorphan-guaiFENesin (ROBAFEN DM  CGH/CHEST CONGEST) 10-100 MG/5ML liquid Take 5 mLs by mouth 3 (three) times daily as needed for cough.    [provider]  diclofenac Sodium (VOLTAREN) 1 % GEL Apply 2 g topically 3 (three) times daily as needed (for pain- RIGHT ELBOW).    [provider]  glucose blood (ONETOUCH VERIO) test strip Use to test blood sugars daily. Dx: E11.9 10/27/15   [name], MD  Ipratropium-Albuterol (COMBIVENT RESPIMAT) 20-100 MCG/ACT AERS respimat Inhale  1 puff into the lungs in the morning and at bedtime.    [provider]  lactose free nutrition (BOOST) LIQD Take 237 mLs by mouth 2 (two) times daily between meals.    [provider]  Lancets [facility] ULTRASOFT) lancets Use to test blood sugars daily. Dx: E11.9 10/27/15   [name], MD  levothyroxine (SYNTHROID) 88 MCG tablet Take 88 mcg by mouth daily before breakfast.    [provider]  montelukast (SINGULAIR) 10 MG tablet Take 10 mg by mouth daily.    [provider]    Physical Exam: Vitals:   03/18/20 1500 03/18/20 1545 03/18/20 1602 03/18/20 1700  BP: (!) 159/92 (!) 167/84  (!) 171/80  Pulse: (!) 33   63  Resp: 14   14  Temp:   (!) 96.5 F (35.8 C)   TempSrc:   Rectal   SpO2: 96%   100%  Weight:      Height:        Constitutional: NAD, calm, comfortable, on room air, appears very dehydrated, alert however not following commands, appears confused. Eyes: PERRL, lids and conjunctivae normal ENMT: Mucous membranes are dry. Posterior pharynx clear of any exudate or lesions.Normal dentition.  Neck: normal, supple, no masses, no thyromegaly Respiratory: clear to auscultation bilaterally, no wheezing, no crackles. Normal respiratory effort. No accessory muscle use.  Cardiovascular: Regular rate and rhythm, no murmurs / rubs / gallops. No extremity edema. 2+ pedal pulses. No carotid bruits.  Abdomen: no tenderness, no masses palpated. No hepatosplenomegaly. Bowel sounds positive.  Musculoskeletal: no clubbing / cyanosis. No joint deformity upper and lower extremities.  Skin: no rashes, lesions, ulcers. No induration Neurologic: Patient alert but not following commands.  Appears confused.   Labs on Admission: I have personally reviewed following labs and imaging studies  CBC: Recent Labs  Lab 03/18/20 1421  WBC 4.9  NEUTROABS 3.3  HGB 12.7*  HCT 39.9  MCV 92.4  PLT 852*   Basic Metabolic Panel: Recent Labs  Lab 03/18/20 1421   NA 139  K 4.8  CL 105  CO2 25  GLUCOSE 152*  BUN 38*  CREATININE 1.74*  CALCIUM 9.3   GFR: Estimated Creatinine Clearance: 28.6 mL/min (A) (by C-G formula based on SCr of 1.74 mg/dL (H)). Liver Function Tests: Recent Labs  Lab 03/18/20 1421  AST 20  ALT 18  ALKPHOS 109  BILITOT 1.2  PROT 6.6  ALBUMIN 3.6   No results for input(s): LIPASE, AMYLASE in the last 168 hours. No results for input(s): AMMONIA in the last 168 hours. Coagulation Profile: No results for input(s): INR, PROTIME in the last 168 hours. Cardiac Enzymes: No results for input(s): CKTOTAL, CKMB, CKMBINDEX, TROPONINI in the last 168 hours. BNP (last 3 results) No results for input(s): PROBNP in the last 8760 hours. HbA1C: No results for input(s): HGBA1C in the last 72 hours. CBG: Recent Labs  Lab 03/18/20 1453  GLUCAP 139*   Lipid Profile: No results for input(s): CHOL, HDL, LDLCALC,  TRIG, CHOLHDL, LDLDIRECT in the last 72 hours. Thyroid Function Tests: No results for input(s): TSH, T4TOTAL, FREET4, T3FREE, THYROIDAB in the last 72 hours. Anemia Panel: No results for input(s): VITAMINB12, FOLATE, FERRITIN, TIBC, IRON, RETICCTPCT in the last 72 hours. Urine analysis:    Component Value Date/Time   COLORURINE YELLOW 03/18/2020 1530   APPEARANCEUR CLOUDY (A) 03/18/2020 1530   LABSPEC 1.013 03/18/2020 1530   PHURINE 5.0 03/18/2020 1530   GLUCOSEU 50 (A) 03/18/2020 1530   HGBUR LARGE (A) 03/18/2020 1530   BILIRUBINUR NEGATIVE 03/18/2020 1530   BILIRUBINUR Negative 04/10/2017 1620   KETONESUR NEGATIVE 03/18/2020 1530   PROTEINUR 100 (A) 03/18/2020 1530   UROBILINOGEN 0.2 04/10/2017 1620   UROBILINOGEN 1.0 12/21/2008 2206   NITRITE NEGATIVE 03/18/2020 1530   LEUKOCYTESUR LARGE (A) 03/18/2020 1530    Radiological Exams on Admission: DG Abdomen 1 View  Result Date: 03/18/2020 CLINICAL DATA:  Not eating. EXAM: ABDOMEN - 1 VIEW COMPARISON:  January 01, 2020 FINDINGS: The bowel gas pattern is normal.  A moderate amount of stool is seen throughout the large bowel with a large amount of stool noted within the distal sigmoid colon. No radio-opaque calculi or other significant radiographic abnormality are seen. IMPRESSION: Negative. Electronically Signed   By: Virgina Norfolk M.D.   On: 03/18/2020 15:08   CT Head Wo Contrast  Result Date: 03/18/2020 CLINICAL DATA:  Delirium, altered mental status. EXAM: CT HEAD WITHOUT CONTRAST TECHNIQUE: Contiguous axial images were obtained from the base of the skull through the vertex without intravenous contrast. COMPARISON:  CT head 01/03/2020 FINDINGS: Brain: Interval increase in the size of a hyperdense mass in the left frontal cortex, now measuring up to 21 by 16 mm (previously 11 x 12 mm). This mass demonstrates central nonenhancement, possibly necrosis. No substantial mass effect. No midline shift. Basal cisterns are patent. Extensive white matter hypoattenuation, compatible with chronic microvascular ischemia. Advanced diffuse cerebral volume loss with ex vacuo ventricular dilation. No evidence of superimposed acute large vascular territory infarct. No hydrocephalus. No acute hemorrhage. Vascular: Calcific atherosclerosis. Skull: Normal. Negative for fracture or focal lesion. Sinuses/Orbits: The sinuses are clear. Redemonstrated depressed left orbital floor fracture, partially imaged. Other: No mastoid effusions. IMPRESSION: 1. Interval increase in the size of a hyperdense mass in the left frontal cortex, now measuring up to 21 x 16 mm (previously 11 x 12 mm). MRI with contrast could further characterize, if clinically indicated. 2. Advanced global atrophy and extensive chronic microvascular ischemic disease. Electronically Signed   By: Margaretha Sheffield MD   On: 03/18/2020 16:59   DG Chest Portable 1 View  Result Date: 03/18/2020 CLINICAL DATA:  Altered mental status. EXAM: PORTABLE CHEST 1 VIEW COMPARISON:  December 31, 2019 FINDINGS: Mildly decreased lung volumes  are seen, likely secondary to the degree of patient inspiration. There is limited evaluation of the superior mediastinum secondary to positioning of the patient's head and neck. The heart size and mediastinal contours are within normal limits. Both lungs are clear. Multilevel degenerative changes seen throughout the thoracic spine. IMPRESSION: No acute or active cardiopulmonary disease. Electronically Signed   By: Virgina Norfolk M.D.   On: 03/18/2020 15:05    EKG: Independently reviewed.  Sinus rhythm.  Atrial premature complexes.  Short PR interval.  Assessment/Plan Principal Problem:   AMS (altered mental status) Active Problems:   Hyperlipidemia   Dementia (HCC)   Essential hypertension   CKD (chronic kidney disease), stage III   Hypothyroidism   UTI (urinary tract  infection)    AMS: Likely in the setting of UTI. -UA positive for infection.  Patient has no leukocytosis. -Was given Unasyn in ED. -Admit patient on the floor.  Start patient on Rocephin.  BC, UC, COVID-19 pending. -We will keep him n.p.o. as he confused. -Consult PT/OT/SLP -On fall/aspiration precautions -Frequent neuro checks. -Monitor vitals closely  Intracranial mass: -Increasing in size.  Reviewed CT head. -Family-refused further imaging and work-up  Hypertension: Blood pressure elevated upon arrival -Hold home BP meds-amlodipine -Hydralazine as needed for blood pressure more than 160/100  Diabetes mellitus: Last A1c 7.8% -Start patient on sliding scale insulin.  Monitor blood sugar closely.  Hypothyroidism: Check TSH -Hold levothyroxine for now  CKD stage III B: At baseline. -Continue to monitor  Alzheimer's dementia: Continue supportive care  Paroxysmal A. fib: Not on anticoagulation due to history of recurrent falls.  History of left MCA infarct: -Left ICA high-grade stenosis. -No aggressive measures-as per family  We will continue to hold patient's home medications as he is confused.   Resume home meds once patient mental status is back to baseline.  DVT prophylaxis: Lovenox/SCD Code Status: DNR Family Communication: None present at bedside.  Plan of care discussed with patient in length and he verbalized understanding and agreed with it.  I called patient's daughter-in-law Robert Salinas and discussed plan of care and she verbalized understanding.  No further imaging for brain mass.  Consult palliative care.  Disposition Plan: Likely hospice care Consults called: Palliative care  admission status: Inpatient   Mckinley Jewel MD [facility]  If 7PM-7AM, please contact night-coverage [email or web address] Password [facility]  03/18/2020, 6:25 PM

## 2020-03-18 NOTE — Consult Note (Signed)
Consultation Note Date: 03/18/2020   Patient Name: Robert Salinas.  DOB: 25-Mar-1927  MRN: [id number]  Age / Sex: 86 y.o., male  PCP: Reymundo Poll, MD Referring Physician: Mckinley Jewel, MD  Reason for Consultation: Establishing goals of care  HPI/Patient Profile: 86 y.o. male  with past medical history of Alzheimer's dementia, brain mass, paroxysmal atrial fibrillation on aspirin, insulin-dependent diabetes, hypertension, and hypothyroidism presented to [facility] emergency department on 03/18/2020 with AMS. Per daughter-in-law, patient had decreased appetite for 2 days with increased fatigue. Daughter-in-law suspects a UTI, which she states he has a history of.  Of note, patient was admitted 12/31/19 with similar symptoms of AMS secondary to UTI. ED Course: UA is consistent with infection, urine culture pending. Patient received Unasyn in the ED. CT shows interval increase in the size of hyperdense mass in the left frontal cortex now measuring up to 21 x 16 mm, previously 11 x 12 mm. MRI recommended, but family does not want further imaging.   Of note, patient is currently enrolled with [facility] outpatient palliative care.  Primary decision maker: Son [name] is HCPOA,  daughter-in-law ([name]'s wife) [name] is authorized to speak for [name] and is main point of contact [phone]  Clinical Assessment and Goals of Care: I have reviewed medical records including EPIC notes, labs and imaging. I spoke with daughter-in-law [name] to discuss diagnosis, prognosis, GOC, EOL wishes, disposition, and options.  I introduced Palliative Medicine as specialized medical care for people living with serious illness. It focuses on providing relief from the symptoms and stress of a serious illness.   We discussed a brief life review of the patient. He is originally from [place] He worked at [employer] (now [employer]) for  many years. His first wife died of breast cancer. They had 2 sons together. He re-married, and his current wife also lives at [place] but they are estranged.   As far as functional status, patient is non-ambulatory and chronically debilitated. He has resided at [place] since 2018.   We discussed her current illness and what it means in the larger context of her ongoing co-morbidities.  Natural disease trajectory of dementia and chronic illness was discussed.   The difference between aggressive medical intervention and comfort care was considered in light of the patient's goals of care. Family clearly does not want aggressive interventions.   Advanced directives, concepts specific to code status, artifical feeding and hydration, and rehospitalization were considered and discussed. Family would not want a feeding tube.   Discussed Palliative Care outpatient versus hospice. I reviewed the concept of a comfort path with [name] -  with goal of helping the patient feel as good as he can for as long as he can with focus on comfort and dignity rather than prolonging life.  Discussed hospice philosophy and information on long-term vs residential hospice services - answered all questions.   Questions and concerns were addressed.  The family was encouraged to call with questions or concerns.   SUMMARY  OF RECOMMENDATIONS   - family does not want aggressive interventions - continue current plan of care to treat UTI and dehydration - family agrees to transition to hospice care on discharge back to [facility] - I have placed TOC order for hospice referral  Code Status/Advance Care Planning:  DNR/DNI  Palliative Prophylaxis:   Aspiration, frequent pain assessment, oral care, resposition  Additional Recommendations (Limitations, Scope, Preferences):  Family would not want feeding tube/artificial feeding  Psycho-social/Spiritual:   Created space and opportunity for patient and  family to express thoughts and feelings regarding patient's current medical situation.   Emotional support provided   Prognosis:   Less than 6 months  Discharge Planning: back to SN[facility]) when medically stable with transition to hospice care      Primary Diagnoses: Present on Admission: . Hyperlipidemia . Essential hypertension . CKD (chronic kidney disease), stage III . UTI (urinary tract infection) . Hypothyroidism . Dementia ([place]) . AMS (altered mental status)   I have reviewed the medical record, interviewed the patient and family, and examined the patient. The following aspects are pertinent.  Past Medical History:  Diagnosis Date  . Alzheimer disease ([place])   . Atrial fibrillation ([place])   . BPH (benign prostatic hyperplasia)   . Chickenpox   . Chronic kidney disease   . COLONIC POLYPS, HX OF 06/09/2009  . Dementia ([place])   . Diabetes mellitus   . Hx of colonic polyps   . Hyperlipidemia   . Hypertension   . Hypothyroidism   . Hypothyroidism   . Thrombocytopenia ([place])      No family history on file. Scheduled Meds: . enoxaparin (LOVENOX) injection  40 mg Subcutaneous Q24H  . insulin aspart  0-5 Units Subcutaneous QHS  . [START ON 03/19/2020] insulin aspart  0-9 Units Subcutaneous TID WC   Continuous Infusions: . cefTRIAXone (ROCEPHIN)  IV     PRN Meds:.acetaminophen **OR** acetaminophen, hydrALAZINE, ondansetron **OR** ondansetron (ZOFRAN) IV Medications Prior to Admission:  Prior to Admission medications   Medication Sig Start Date End Date Taking? Authorizing Provider  acetaminophen (TYLENOL) 325 MG tablet Take 2 tablets (650 mg total) by mouth every 4 (four) hours as needed for mild pain (or temp > 37.5 C (99.5 F)). 01/03/20   Georgette Shell, MD  albuterol [facility] HFA) 108 [phone] Base) MCG/ACT inhaler Inhale 2 puffs into the lungs 4 (four) times daily as needed for wheezing or shortness of breath.    [provider]  amLODipine  (NORVASC) 2.5 MG tablet Take 1 tablet (2.5 mg total) by mouth daily. Patient taking differently: Take 2.5 mg by mouth at bedtime.  03/15/17   [name], MD  ammonium lactate (LAC-HYDRIN) 12 % lotion Apply 1 application topically See admin instructions. Apply to both feet at bedtime 07/19/19   [provider]  dextromethorphan-guaiFENesin (ROBAFEN DM [facility]/CHEST CONGEST) 10-100 MG/5ML liquid Take 5 mLs by mouth 3 (three) times daily as needed for cough.    [provider]  diclofenac Sodium (VOLTAREN) 1 % GEL Apply 2 g topically 3 (three) times daily as needed (for pain- RIGHT ELBOW).    [provider]  glucose blood (ONETOUCH VERIO) test strip Use to test blood sugars daily. Dx: E11.9 10/27/15   [name], MD  Ipratropium-Albuterol (COMBIVENT RESPIMAT) 20-100 MCG/ACT AERS respimat Inhale 1 puff into the lungs in the morning and at bedtime.    [provider]  lactose free nutrition (BOOST) LIQD Take 237 mLs by mouth  2 (two) times daily between meals.    [provider]  Lancets [facility] ULTRASOFT) lancets Use to test blood sugars daily. Dx: E11.9 10/27/15   [name], MD  levothyroxine (SYNTHROID) 88 MCG tablet Take 88 mcg by mouth daily before breakfast.    [provider]  montelukast (SINGULAIR) 10 MG tablet Take 10 mg by mouth daily.    [provider]   No Known Allergies Review of Systems  Unable to perform ROS: Dementia    Physical Exam Vitals reviewed.  Constitutional:      General: He is not in acute distress. HENT:     Head: Normocephalic and atraumatic.  Cardiovascular:     Rate and Rhythm: Normal rate.  Pulmonary:     Effort: Pulmonary effort is normal.  Neurological:     Mental Status: He is alert. He is confused.     Vital Signs: BP (!) 156/81   Pulse 61   Temp (!) 96.5 F (35.8 C) (Rectal)   Resp 12   Ht 5\' 10"  (1.778 m)   Wt 74.1 kg   SpO2 100%   BMI 23.44 kg/m  Pain Scale:  PAINAD    SpO2: SpO2: 100 % O2 Device:SpO2: 100 % O2 Flow Rate: .   Palliative Assessment/Data: PPS 20%     Time In: 19:00 Time Out: 19:50 Time Total: 50 minutes Greater than 50%  of this time was spent counseling and coordinating care related to the above assessment and plan.  Signed by: Lavena Bullion, NP   Please contact Palliative Medicine Team phone at [phone] for questions and concerns.  For individual provider: See Shea Evans

## 2020-03-18 NOTE — ED Provider Notes (Signed)
[facility] EMERGENCY DEPARTMENT Provider Note   CSN: [id number] Arrival date & time: 03/18/20  1352     History Chief Complaint  Patient presents with  . Altered Mental Status    Robert Salinas. is a 86 y.o. male.  HPI    86 year old male with a history of Alzheimer's, atrial fibrillation, BPH, CKD, dementia, diabetes, hyperlipidemia, hypertension, hypothyroidism, thrombocytopenia, who presents the emergency department today for evaluation of altered mental status.  There is a level 5 caveat as patient is altered and cannot provide an accurate history.  2:24 PM Discussed case with Gwen from [employer]. She states that the patient has not been eating for the last 2 days and today his blood sugar was higher than normal. He also seems more fatigued and more tired than normal.  Denies fevers, vomiting, diarrhea, cough. States that he normally rolls himself around in his wheelchair and is able to feed himself.  He is currently on palliative care and family is considering hospice care once he is back from the hospital.   Past Medical History:  Diagnosis Date  . Alzheimer disease ([place])   . Atrial fibrillation ([place])   . BPH (benign prostatic hyperplasia)   . Chickenpox   . Chronic kidney disease   . COLONIC POLYPS, HX OF 06/09/2009  . Dementia ([place])   . Diabetes mellitus   . Hx of colonic polyps   . Hyperlipidemia   . Hypertension   . Hypothyroidism   . Hypothyroidism   . Thrombocytopenia [facility])     Patient Active Problem List   Diagnosis Date Noted  . Stenosis of left carotid artery   . Cerebral embolism with cerebral infarction 12/31/2019  . TIA (transient ischemic attack) 12/31/2019  . AMS (altered mental status)   . UTI (urinary tract infection) 03/20/2017  . DNR (do not resuscitate) 12/08/2013  . Hypothyroidism 10/09/2011  . CKD (chronic kidney disease), stage III 02/21/2011  . Diabetes mellitus type II, controlled ([place]) 06/09/2009  .  Hyperlipidemia 06/09/2009  . Dementia ([place]) 06/09/2009  . Essential hypertension 06/09/2009  . Atrial fibrillation ([place]) 06/09/2009    Past Surgical History:  Procedure Laterality Date  . APPENDECTOMY    . PROSTATECTOMY     BPH cause?       No family history on file.  Social History   Tobacco Use  . Smoking status: Never Smoker  . Smokeless tobacco: Never Used  Substance Use Topics  . Alcohol use: No  . Drug use: No    Home Medications Prior to Admission medications   Medication Sig Start Date End Date Taking? Authorizing Provider  acetaminophen (TYLENOL) 325 MG tablet Take 2 tablets (650 mg total) by mouth every 4 (four) hours as needed for mild pain (or temp > 37.5 C (99.5 F)). 01/03/20   Georgette Shell, MD  albuterol [facility] HFA) [phone] Base) MCG/ACT inhaler Inhale 2 puffs into the lungs 4 (four) times daily as needed for wheezing or shortness of breath.    [provider]  amLODipine (NORVASC) 2.5 MG tablet Take 1 tablet (2.5 mg total) by mouth daily. Patient taking differently: Take 2.5 mg by mouth at bedtime.  03/15/17   [name], MD  ammonium lactate (LAC-HYDRIN) 12 % lotion Apply 1 application topically See admin instructions. Apply to both feet at bedtime 07/19/19   [provider]  dextromethorphan-guaiFENesin (ROBAFEN DM CGH/CHEST CONGEST) 10-100 MG/5ML liquid Take 5 mLs by mouth 3 (three) times daily as  needed for cough.    [provider]  diclofenac Sodium (VOLTAREN) 1 % GEL Apply 2 g topically 3 (three) times daily as needed (for pain- RIGHT ELBOW).    [provider]  glucose blood (ONETOUCH VERIO) test strip Use to test blood sugars daily. Dx: E11.9 10/27/15   [name], MD  Ipratropium-Albuterol (COMBIVENT RESPIMAT) 20-100 MCG/ACT AERS respimat Inhale 1 puff into the lungs in the morning and at bedtime.    [provider]  lactose free nutrition (BOOST) LIQD Take 237 mLs by mouth 2 (two) times  daily between meals.    [provider]  Lancets [facility] ULTRASOFT) lancets Use to test blood sugars daily. Dx: E11.9 10/27/15   [name], MD  levothyroxine (SYNTHROID) 88 MCG tablet Take 88 mcg by mouth daily before breakfast.    [provider]  montelukast (SINGULAIR) 10 MG tablet Take 10 mg by mouth daily.    [provider]    Allergies    Patient has no known allergies.  Review of Systems   Review of Systems  Unable to perform ROS: Mental status change    Physical Exam Updated Vital Signs BP (!) 171/80   Pulse 63   Temp (!) 96.5 F (35.8 C) (Rectal)   Resp 14   Ht 5\' 10"  (1.778 m)   Wt 74.1 kg   SpO2 100%   BMI 23.44 kg/m   Physical Exam Vitals and nursing note reviewed.  Constitutional:      Appearance: He is well-developed.  HENT:     Head: Normocephalic and atraumatic.  Eyes:     Conjunctiva/sclera: Conjunctivae normal.  Cardiovascular:     Rate and Rhythm: Normal rate and regular rhythm.  Pulmonary:     Effort: Pulmonary effort is normal. No respiratory distress.     Breath sounds: Normal breath sounds.  Abdominal:     General: Bowel sounds are normal.     Palpations: Abdomen is soft.     Tenderness: There is no abdominal tenderness. There is no guarding or rebound.  Musculoskeletal:     Cervical back: Neck supple.  Skin:    General: Skin is warm and dry.  Neurological:     Mental Status: He is alert.     Comments: Clear speech, no facial droop, moving all extremities, no asymmetric strength noted to bue.      ED Results / Procedures / Treatments   Labs (all labs ordered are listed, but only abnormal results are displayed) Labs Reviewed  CBC WITH DIFFERENTIAL/PLATELET - Abnormal; Notable for the following components:      Result Value   Hemoglobin 12.7 (*)    Platelets 145 (*)    All other components within normal limits  COMPREHENSIVE METABOLIC PANEL - Abnormal; Notable for the following components:    Glucose, Bld 152 (*)    BUN 38 (*)    Creatinine, Ser 1.74 (*)    GFR calc non Af Amer 34 (*)    GFR calc Af Amer 39 (*)    All other components within normal limits  URINALYSIS, ROUTINE W REFLEX MICROSCOPIC - Abnormal; Notable for the following components:   APPearance CLOUDY (*)    Glucose, UA 50 (*)    Hgb urine dipstick LARGE (*)    Protein, ur 100 (*)    Leukocytes,Ua LARGE (*)    RBC / HPF >50 (*)    WBC, UA >50 (*)    Bacteria, UA FEW (*)  All other components within normal limits  CBG MONITORING, ED - Abnormal; Notable for the following components:   Glucose-Capillary 139 (*)    All other components within normal limits  URINE CULTURE  CULTURE, BLOOD (ROUTINE X 2)  CULTURE, BLOOD (ROUTINE X 2)  RESPIRATORY PANEL BY RT PCR (FLU A&B, COVID)  LACTIC ACID, PLASMA  LACTIC ACID, PLASMA  TROPONIN I (HIGH SENSITIVITY)  TROPONIN I (HIGH SENSITIVITY)    EKG None  Radiology DG Abdomen 1 View  Result Date: 03/18/2020 CLINICAL DATA:  Not eating. EXAM: ABDOMEN - 1 VIEW COMPARISON:  January 01, 2020 FINDINGS: The bowel gas pattern is normal. A moderate amount of stool is seen throughout the large bowel with a large amount of stool noted within the distal sigmoid colon. No radio-opaque calculi or other significant radiographic abnormality are seen. IMPRESSION: Negative. Electronically Signed   By: Virgina Norfolk M.D.   On: 03/18/2020 15:08   CT Head Wo Contrast  Result Date: 03/18/2020 CLINICAL DATA:  Delirium, altered mental status. EXAM: CT HEAD WITHOUT CONTRAST TECHNIQUE: Contiguous axial images were obtained from the base of the skull through the vertex without intravenous contrast. COMPARISON:  CT head 01/03/2020 FINDINGS: Brain: Interval increase in the size of a hyperdense mass in the left frontal cortex, now measuring up to 21 by 16 mm (previously 11 x 12 mm). This mass demonstrates central nonenhancement, possibly necrosis. No substantial mass effect. No midline shift. Basal  cisterns are patent. Extensive white matter hypoattenuation, compatible with chronic microvascular ischemia. Advanced diffuse cerebral volume loss with ex vacuo ventricular dilation. No evidence of superimposed acute large vascular territory infarct. No hydrocephalus. No acute hemorrhage. Vascular: Calcific atherosclerosis. Skull: Normal. Negative for fracture or focal lesion. Sinuses/Orbits: The sinuses are clear. Redemonstrated depressed left orbital floor fracture, partially imaged. Other: No mastoid effusions. IMPRESSION: 1. Interval increase in the size of a hyperdense mass in the left frontal cortex, now measuring up to 21 x 16 mm (previously 11 x 12 mm). MRI with contrast could further characterize, if clinically indicated. 2. Advanced global atrophy and extensive chronic microvascular ischemic disease. Electronically Signed   By: Margaretha Sheffield MD   On: 03/18/2020 16:59   DG Chest Portable 1 View  Result Date: 03/18/2020 CLINICAL DATA:  Altered mental status. EXAM: PORTABLE CHEST 1 VIEW COMPARISON:  December 31, 2019 FINDINGS: Mildly decreased lung volumes are seen, likely secondary to the degree of patient inspiration. There is limited evaluation of the superior mediastinum secondary to positioning of the patient's head and neck. The heart size and mediastinal contours are within normal limits. Both lungs are clear. Multilevel degenerative changes seen throughout the thoracic spine. IMPRESSION: No acute or active cardiopulmonary disease. Electronically Signed   By: Virgina Norfolk M.D.   On: 03/18/2020 15:05    Procedures Procedures (including critical care time)  Medications Ordered in ED Medications  Ampicillin-Sulbactam (UNASYN) 3 g in sodium chloride 0.9 % 100 mL IVPB (3 g Intravenous New Bag/Given 03/18/20 1753)    ED Course  I have reviewed the triage vital signs and the nursing notes.  Pertinent labs & imaging results that were available during my care of the patient were reviewed  by me and considered in my medical decision making (see chart for details).    MDM Rules/Calculators/A&P                          86 y/o M presenting for eval of AMS.  Reviewed/interpreted labs CBC w/o leukocytosis, mild anemia, thrombocytopenia CMP with elevated BUN/Cr consistent with prior  Trop negative UA with hematuria, large leuks, greater than 50 RBCs, greater than 50 WBCs, few bacteria, WBC clumps present, urine culture added.  - discussed with pharmacy about abx, will give unasyn per pharmacy recommendations  CXR - No acute or active cardiopulmonary disease. abd xray -  Negative. CT head - 1. Interval increase in the size of a hyperdense mass in the left frontal cortex, now measuring up to 21 x 16 mm (previously 11 x 12 mm). MRI with contrast could further characterize, if clinically indicated. 2. Advanced global atrophy and extensive chronic microvascular ischemic disease.  5:05 PM  Updated patients family on care plan and findings of workup.  The patient's daughter-in-law confirms that patient is currently on palliative care and in the process of possibly transitioning to hospice.  They would still like the patient's urinary tract infection to be treated but they do not want a pursue further work-up or imaging of his brain tumor as they would likely not intervene on it given his age and comorbidities.  Will plan for admission for further tx of uti with AMS. Requires iv abx as he is not taking po at home. No obvious evidence of sepsis on admission.   6:00 PM CONSULT With Dr. Deretha Emory who accepts patient for admission fur further tx.   Final Clinical Impression(s) / ED Diagnoses Final diagnoses:  Acute cystitis with hematuria    Rx / DC Orders ED Discharge Orders    None       Bishop Dublin 03/18/20 Tommi Emery, MD 03/21/20 [phone]

## 2020-03-19 LAB — CBC
HCT: 41.3 % (ref 39.0–52.0)
Hemoglobin: 13.2 g/dL (ref 13.0–17.0)
MCH: 29.6 pg (ref 26.0–34.0)
MCHC: 32 g/dL (ref 30.0–36.0)
MCV: 92.6 fL (ref 80.0–100.0)
Platelets: 157 10*3/uL (ref 150–400)
RBC: 4.46 MIL/uL (ref 4.22–5.81)
RDW: 14.1 % (ref 11.5–15.5)
WBC: 5.2 10*3/uL (ref 4.0–10.5)
nRBC: 0 % (ref 0.0–0.2)

## 2020-03-19 LAB — COMPREHENSIVE METABOLIC PANEL
ALT: 17 U/L (ref 0–44)
AST: 18 U/L (ref 15–41)
Albumin: 3.5 g/dL (ref 3.5–5.0)
Alkaline Phosphatase: 113 U/L (ref 38–126)
Anion gap: 12 (ref 5–15)
BUN: 35 mg/dL — ABNORMAL HIGH (ref 8–23)
CO2: 25 mmol/L (ref 22–32)
Calcium: 9.6 mg/dL (ref 8.9–10.3)
Chloride: 106 mmol/L (ref 98–111)
Creatinine, Ser: 1.58 mg/dL — ABNORMAL HIGH (ref 0.61–1.24)
GFR calc Af Amer: 44 mL/min — ABNORMAL LOW (ref >60)
GFR calc non Af Amer: 38 mL/min — ABNORMAL LOW (ref >60)
Glucose, Bld: 159 mg/dL — ABNORMAL HIGH (ref 70–99)
Potassium: 4.2 mmol/L (ref 3.5–5.1)
Sodium: 143 mmol/L (ref 135–145)
Total Bilirubin: 1.5 mg/dL — ABNORMAL HIGH (ref 0.3–1.2)
Total Protein: 6.4 g/dL — ABNORMAL LOW (ref 6.5–8.1)

## 2020-03-19 LAB — GLUCOSE, CAPILLARY
Glucose-Capillary: 284 mg/dL — ABNORMAL HIGH (ref 70–99)
Glucose-Capillary: 109 mg/dL — ABNORMAL HIGH (ref 70–99)
Glucose-Capillary: 178 mg/dL — ABNORMAL HIGH (ref 70–99)
Glucose-Capillary: 119 mg/dL — ABNORMAL HIGH (ref 70–99)
Glucose-Capillary: 109 mg/dL — ABNORMAL HIGH (ref 70–99)

## 2020-03-19 LAB — MRSA PCR SCREENING: MRSA by PCR: POSITIVE — AB

## 2020-03-19 MED ORDER — AMLODIPINE BESYLATE 2.5 MG PO TABS
2.5000 mg | ORAL_TABLET | Freq: Every day | ORAL | Status: DC
  Administered 2020-03-19: 2.5 mg via ORAL
  Filled 2020-03-19: qty 1

## 2020-03-19 MED ORDER — CHLORHEXIDINE GLUCONATE CLOTH 2 % EX PADS: 6.0000 | MEDICATED_PAD | Freq: Every day | CUTANEOUS | Status: DC

## 2020-03-19 MED ORDER — SODIUM CHLORIDE 0.9 % IV SOLN
INTRAVENOUS | Status: DC | PRN
  Administered 2020-03-19: 500 mL via INTRAVENOUS

## 2020-03-19 MED ORDER — MUPIROCIN 2 % EX OINT
1.0000 "application " | TOPICAL_OINTMENT | Freq: Two times a day (BID) | CUTANEOUS | Status: DC
  Administered 2020-03-19: 1 via NASAL
  Filled 2020-03-19: qty 22

## 2020-03-19 MED ORDER — CEPHALEXIN 500 MG PO CAPS: 500.0000 mg | ORAL_CAPSULE | Freq: Three times a day (TID) | ORAL | 0 refills | Status: AC

## 2020-03-19 MED ORDER — LEVOTHYROXINE SODIUM 88 MCG PO TABS
88.0000 ug | ORAL_TABLET | Freq: Every day | ORAL | Status: DC
  Administered 2020-03-19: 88 ug via ORAL
  Filled 2020-03-19: qty 1

## 2020-03-19 NOTE — TOC Transition Note (Signed)
Transition of Care [facility]) - CM/SW Discharge Note   Patient Details  Name: Robert Salinas. MRN: [id number] Date of Birth: 09/04/1926  [facility]) CM/SW Contact:  Geralynn Ochs, LCSW Phone Number: 03/19/2020, 12:39 PM   Clinical Narrative:   CSW confirmed that patient can return to [facility] today, and confirmed plan to return with daughter in law, Robert Salinas. CSW sent all discharge information, awaiting [facility] to review before calling for transport. CSW sent update to [facility] that family would prefer patient be setup with hospice care upon return to [facility]. [facility] aware of hospice setup, as well.  Nurse to call report to [phone].    Final next level of care: Memory Care Barriers to Discharge: Barriers Resolved   Patient Goals and CMS Choice Patient states their goals for this hospitalization and ongoing recovery are:: patient unable to participate in goal setting due to disorientation CMS Medicare.gov Compare Post Acute Care list provided to:: Patient Represenative (must comment) Choice offered to / list presented to : Adult Children  Discharge Placement              Patient chooses bed at: [facility] Patient to be transferred to facility by: [place] Name of family member notified: Robert Salinas Patient and family notified of of transfer: 03/19/20  Discharge Plan and Services                                     Social Determinants of Health (SDOH) Interventions     Readmission Risk Interventions No flowsheet data found.

## 2020-03-19 NOTE — Progress Notes (Signed)
Patient discharged via ptar, all personal belongings sent with patient   IV removed

## 2020-03-19 NOTE — NC FL2 (Signed)
[place] LEVEL OF CARE SCREENING TOOL     IDENTIFICATION  Patient Name: Robert Salinas. Birthdate: [birth year] Sex: male Admission Date (Current Location): 03/18/2020  [facility] and [place] Number:  Herbalist and Address:  [facility], [place] [address], [place], [place] [address]      Provider Number: [id number]  Attending Physician Name and Address:  Edwin Dada, *  Relative Name and Phone Number:       Current Level of Care: Hospital Recommended Level of Care: Memory Care Prior Approval Number:    Date Approved/Denied:   PASRR Number:    Discharge Plan: Other (Comment) (Memory Care)    Current Diagnoses: Patient Active Problem List   Diagnosis Date Noted   Stenosis of left carotid artery    Cerebral embolism with cerebral infarction 12/31/2019   TIA (transient ischemic attack) 12/31/2019   AMS (altered mental status)    UTI (urinary tract infection) 03/20/2017   DNR (do not resuscitate) 12/08/2013   Hypothyroidism 10/09/2011   CKD (chronic kidney disease), stage III ([place]) 02/21/2011   Diabetes mellitus type II, controlled ([place]) 06/09/2009   Hyperlipidemia 06/09/2009   Dementia ([place]) 06/09/2009   Essential hypertension 06/09/2009   Atrial fibrillation ([place]) 06/09/2009    Orientation RESPIRATION BLADDER Height & Weight     Self  Normal Incontinent Weight: 161 lb 13.1 oz (73.4 kg) Height:  5\' 10"  (177.8 cm)  BEHAVIORAL SYMPTOMS/MOOD NEUROLOGICAL BOWEL NUTRITION STATUS      Incontinent Diet  AMBULATORY STATUS COMMUNICATION OF NEEDS Skin   Extensive Assist Verbally Normal                       Personal Care Assistance Level of Assistance  Bathing, Feeding, Dressing Bathing Assistance: Maximum assistance Feeding assistance: Limited assistance Dressing Assistance: Maximum assistance     Functional Limitations Info  Speech     Speech Info: Impaired (dysarthria)    SPECIAL  CARE FACTORS FREQUENCY                       Contractures Contractures Info: Not present    Additional Factors Info  Code Status, Allergies Code Status Info: DNR Allergies Info: NKA           Current Medications (03/19/2020):  This is the current hospital active medication list Current Facility-Administered Medications  Medication Dose Route Frequency Provider Last Rate Last Admin   0.9 %  sodium chloride infusion   Intravenous PRN Pahwani, Rinka R, MD 10 mL/hr at 03/19/20 0430 500 mL at 03/19/20 0430   acetaminophen (TYLENOL) tablet 650 mg  650 mg Oral Q6H PRN Pahwani, Rinka R, MD       Or   acetaminophen (TYLENOL) suppository 650 mg  650 mg Rectal Q6H PRN Pahwani, Rinka R, MD       amLODipine (NORVASC) tablet 2.5 mg  2.5 mg Oral Daily Danford, Suann Larry, MD   2.5 mg at 03/19/20 1054   cefTRIAXone (ROCEPHIN) 2 g in sodium chloride 0.9 % 100 mL IVPB  2 g Intravenous Q24H Pahwani, Rinka R, MD 200 mL/hr at 03/19/20 0431 2 g at 03/19/20 0431   Chlorhexidine Gluconate Cloth 2 % PADS 6 each  6 each Topical Q0600 Pahwani, Rinka R, MD       enoxaparin (LOVENOX) injection 40 mg  40 mg Subcutaneous Q24H Pahwani, Rinka R, MD   40 mg at 03/18/20 2249  hydrALAZINE (APRESOLINE) injection 10 mg  10 mg Intravenous Q6H PRN Pahwani, Rinka R, MD   10 mg at 03/18/20 2248   insulin aspart (novoLOG) injection 0-5 Units  0-5 Units Subcutaneous QHS Pahwani, Rinka R, MD       insulin aspart (novoLOG) injection 0-9 Units  0-9 Units Subcutaneous TID WC Pahwani, Rinka R, MD   2 Units at 03/19/20 1143   [START ON 03/20/2020] levothyroxine (SYNTHROID) tablet 88 mcg  88 mcg Oral QAC breakfast Edwin Dada, MD   88 mcg at 03/19/20 1054   mupirocin ointment (BACTROBAN) 2 % 1 application  1 application Nasal BID Pahwani, Rinka R, MD   1 application at 30/09/23 1040   ondansetron (ZOFRAN) tablet 4 mg  4 mg Oral Q6H PRN Pahwani, Rinka R, MD       Or   ondansetron (ZOFRAN) injection 4  mg  4 mg Intravenous Q6H PRN Pahwani, Rinka R, MD         Discharge Medications: TAKE these medications   acetaminophen 325 MG tablet Commonly known as: TYLENOL Take 2 tablets (650 mg total) by mouth every 4 (four) hours as needed for mild pain (or temp > 37.5 C (99.5 F)).   amLODipine 2.5 MG tablet Commonly known as: NORVASC Take 1 tablet (2.5 mg total) by mouth daily.   ammonium lactate 12 % lotion Commonly known as: LAC-HYDRIN Apply 1 application topically See admin instructions. Apply to both feet at bedtime   cephALEXin 500 MG capsule Commonly known as: KEFLEX Take 1 capsule (500 mg total) by mouth 3 (three) times daily for 4 days.   Combivent Respimat 20-100 MCG/ACT Aers respimat Generic drug: Ipratropium-Albuterol Inhale 1 puff into the lungs in the morning and at bedtime.   glucose blood test strip Commonly known as: OneTouch Verio Use to test blood sugars daily. Dx: E11.9   lactose free nutrition Liqd Take 237 mLs by mouth 2 (two) times daily between meals.   levothyroxine 88 MCG tablet Commonly known as: SYNTHROID Take 88 mcg by mouth daily before breakfast.   montelukast 10 MG tablet Commonly known as: SINGULAIR Take 10 mg by mouth daily.   onetouch ultrasoft lancets Use to test blood sugars daily. Dx: E11.9   ProAir HFA 108 (90 Base) MCG/ACT inhaler Generic drug: albuterol Inhale 2 puffs into the lungs 4 (four) times daily as needed for wheezing or shortness of breath.   Robafen DM Cgh/Chest Congest 10-100 MG/5ML liquid Generic drug: dextromethorphan-guaiFENesin Take 5 mLs by mouth 3 (three) times daily as needed for cough.   Voltaren 1 % Gel Generic drug: diclofenac Sodium Apply 2 g topically 3 (three) times daily as needed (for pain- RIGHT ELBOW).     Relevant Imaging Results:  Relevant Lab Results:   Additional Information SS#: [id number]; Hospice to be set up at facility  [facility], LCSW

## 2020-03-19 NOTE — Progress Notes (Signed)
Manufacturing engineer [facility])  Received request from [facility] for hospice services at home after discharge.  Chart and pt information under review by [facility] physician.  Hospice eligibility pending at this time.  Hospital liaison spoke with [name], daughter-in-law, to initiate education related to hospice philosophy and services and to answer any questions at this time.  [name] verbalized understanding of information given.  Per discussion the plan is to discharge back to [facility] today by [facility].    [place] send signed and completed DNR home with pt/family.  Please provide prescriptions at discharge as needed to ensure ongoing symptom management until pt can be admitted onto hospice.    DME needs discussed.   Family denies any DME needs at this time.  Address has been verified and is correct in the chart.  [facility] information and contact numbers given to [name], daughter-in-law.  Above information shared with Kristopher Oppenheim Manager. Please call with any questions or concerns.  Thank you for the opportunity to participate in this pt's care.  Domenic Moras, BSN, RN [employer] [phone] (24h on call)

## 2020-03-19 NOTE — Evaluation (Signed)
Physical Therapy Evaluation Patient Details Name: Robert Salinas. MRN: [id number] DOB: 1926-09-25 Today's Date: 03/19/2020   History of Present Illness  Patient is a 86 y/o male who presents with AMS. Found to have UTI. PMH includes Alzheimer's dementia, HTN, A-fib, CKD, DM2 and brain mass.  Clinical Impression  Patient presents with cognitive deficits, impaired balance, generalized weakness, decreased activity tolerance and impaired mobility s/p above. Pt is from [facility] and not able to provide any information about PLOF/history. Per chart review, pt is w/c bound and non ambulatory at baseline. Today, pt requires Max A to partially stand with flexed hips/knees and Max A for bed mobility. Pt focused on getting wrapped up and warm in blankets throughout session. Able to redirect for short periods but also very HOH. Per notes, plan is for pt to return to [facility] on hospice care. Does not require skilled therapy services. Discharge from therapy.    Follow Up Recommendations Other (comment) (Return to [facility])    Equipment Recommendations  None recommended by PT    Recommendations for Other Services       Precautions / Restrictions Precautions Precautions: Fall Restrictions Weight Bearing Restrictions: No      Mobility  Bed Mobility Overal bed mobility: Needs Assistance Bed Mobility: Supine to Sit;Sit to Supine     Supine to sit: Max assist;HOB elevated Sit to supine: Min assist   General bed mobility comments: Assist to bring LEs to EOB and to elevate trunk as pt trying to cover self up in blankets. Able to bring LEs into bed and assist to lower trunk to return to supine.  Transfers Overall transfer level: Needs assistance Equipment used: None Transfers: Sit to/from Stand Sit to Stand: Max assist         General transfer comment: Assist to power to standing; flexed hip/knees bilaterally, limited standing.  Ambulation/Gait              General Gait Details: Unable  Stairs            Wheelchair Mobility    Modified Rankin (Stroke Patients Only)       Balance Overall balance assessment: Needs assistance Sitting-balance support: Feet unsupported;No upper extremity supported Sitting balance-Leahy Scale: Poor Sitting balance - Comments: Min A for sitting EOB; trying to climb back into bed throughout session.   Standing balance support: During functional activity Standing balance-Leahy Scale: Zero Standing balance comment: Not able to stand fully upright despite max A, flexed posture overall.                             Pertinent Vitals/Pain Pain Assessment: Faces Faces Pain Scale: No hurt    Home Living Family/patient expects to be discharged to:: Other (Comment) ([facility])                 Additional Comments: appears [facility] is [facility] and could return    Prior Function Level of Independence: Needs assistance   Gait / Transfers Assistance Needed: Per chart, pt is non ambulatory and uses w/c for mobility.     Comments: Pt not the best historian due to [facility] so not able to get more details regarding mobility.     Hand Dominance   Dominant Hand: Right    Extremity/Trunk Assessment   Upper Extremity Assessment Upper Extremity Assessment: Defer to OT evaluation    Lower Extremity Assessment Lower Extremity Assessment: Generalized  weakness;RLE deficits/detail;LLE deficits/detail;Difficult to assess due to impaired cognition RLE Deficits / Details: limited knee extension AROM LLE Deficits / Details: limited knee extension AROM    Cervical / Trunk Assessment Cervical / Trunk Assessment: Kyphotic  Communication   Communication: HOH  Cognition Arousal/Alertness: Awake/alert Behavior During Therapy: WFL for tasks assessed/performed Overall Cognitive Status: No family/caregiver present to determine baseline cognitive functioning                                  General Comments: HOH; Requires repetition to follow commands inconsistently. Oriented to self only. LIkely baseline cognition but difficult to assess. Hx of Alzhemier's dementia.      General Comments      Exercises     Assessment/Plan    PT Assessment All further PT needs can be met in the next venue of care  PT Problem List Decreased strength;Decreased mobility;Decreased safety awareness;Decreased cognition;Decreased activity tolerance;Decreased balance       PT Treatment Interventions Therapeutic activities;DME instruction;Patient/family education;Wheelchair mobility training;Balance training;Functional mobility training;Therapeutic exercise    PT Goals (Current goals can be found in the Care Plan section)  Acute Rehab PT Goals Patient Stated Goal: get covered in blankets PT Goal Formulation: Patient unable to participate in goal setting    Frequency Min 2X/week   Barriers to discharge        Co-evaluation PT/OT/SLP Co-Evaluation/Treatment: Yes Reason for Co-Treatment: Necessary to address cognition/behavior during functional activity;To address functional/ADL transfers;For patient/therapist safety PT goals addressed during session: Mobility/safety with mobility;Balance         AM-PAC PT "6 Clicks" Mobility  Outcome Measure Help needed turning from your back to your side while in a flat bed without using bedrails?: A Lot Help needed moving from lying on your back to sitting on the side of a flat bed without using bedrails?: Total Help needed moving to and from a bed to a chair (including a wheelchair)?: Total Help needed standing up from a chair using your arms (e.g., wheelchair or bedside chair)?: Total Help needed to walk in hospital room?: Total Help needed climbing 3-5 steps with a railing? : Total 6 Click Score: 7    End of Session   Activity Tolerance: Other (comment) (limited due to cognition) Patient left: in bed;with call  bell/phone within reach;with bed alarm set Nurse Communication: Mobility status PT Visit Diagnosis: Muscle weakness (generalized) (M62.81);Difficulty in walking, not elsewhere classified (R26.2);Unsteadiness on feet (R26.81)    Time: 9201-0071 PT Time Calculation (min) (ACUTE ONLY): 14 min   Charges:   PT Evaluation $PT Eval Moderate Complexity: 1 Mod          Marisa Severin, PT, DPT Acute Rehabilitation Services Pager [phone] Office [place] 03/19/2020, 12:34 PM

## 2020-03-19 NOTE — Progress Notes (Signed)
Report called to Med Tech Fanta at the facility and informed that he transportation is set for 1600.  No signs of distress.  IV access removed.

## 2020-03-19 NOTE — Discharge Summary (Deleted)
Physician Discharge Summary  Robert Salinas. KGY:[id number] DOB: Jul 27, 1926 DOA: 03/18/2020  PCP: Reymundo Poll, MD  Admit date: 03/18/2020 Discharge date: 03/19/2020  Admitted From: ALF  Disposition:  ALF   Recommendations for Outpatient Follow-up:  1. Please follow with Hospice      Home Health: N/A  Equipment/Devices: N/A  Discharge Condition: Declining  CODE STATUS: DNR Diet recommendation: Liberal  Brief/Interim Summary: Robert Salinas is a 86 y.o. M with hx Alzheimer's, hypothyroidism, Afib on aspirin alone, DM, HTN and brain mass who presented for confusion.  In the ER, UA suggested new pyuria and glucoses elevated.  CT head showed increased size of brain mass.  Started on antibiotics for UTI.         PRINCIPAL HOSPITAL DIAGNOSIS: Confusion    Discharge Diagnoses:   Confusion, likely delirium  Multifactorial from dehydration, UTI,and brain mass in the setting of failure to thrive.  UTI Started on ceftriaxone in hospital.  Please complete 5 days with cephalexin.  Brain mass This was discussed with family. They have deferred work up.  At this point, the patient appears to be declining.  He likely has less than 6 months life expectancy.  I recommend Hospice enrollment and pivot to comfort cares, minimizing uncomfortable procedures, natural progression of illness.     Hypothyroidism Alzheimer's dementia Atrial fibrillation, permanent Hypertension Diabetes        Discharge Instructions   Allergies as of 03/19/2020   No Known Allergies     Medication List    TAKE these medications   acetaminophen 325 MG tablet Commonly known as: TYLENOL Take 2 tablets (650 mg total) by mouth every 4 (four) hours as needed for mild pain (or temp > 37.5 C (99.5 F)).   amLODipine 2.5 MG tablet Commonly known as: NORVASC Take 1 tablet (2.5 mg total) by mouth daily.   ammonium lactate 12 % lotion Commonly known as: LAC-HYDRIN Apply 1 application topically  See admin instructions. Apply to both feet at bedtime   cephALEXin 500 MG capsule Commonly known as: KEFLEX Take 1 capsule (500 mg total) by mouth 3 (three) times daily for 4 days.   Combivent Respimat 20-100 MCG/ACT Aers respimat Generic drug: Ipratropium-Albuterol Inhale 1 puff into the lungs in the morning and at bedtime.   glucose blood test strip Commonly known as: OneTouch Verio Use to test blood sugars daily. Dx: E11.9   lactose free nutrition Liqd Take 237 mLs by mouth 2 (two) times daily between meals.   levothyroxine 88 MCG tablet Commonly known as: SYNTHROID Take 88 mcg by mouth daily before breakfast.   montelukast 10 MG tablet Commonly known as: SINGULAIR Take 10 mg by mouth daily.   onetouch ultrasoft lancets Use to test blood sugars daily. Dx: E11.9   ProAir HFA 108 (90 Base) MCG/ACT inhaler Generic drug: albuterol Inhale 2 puffs into the lungs 4 (four) times daily as needed for wheezing or shortness of breath.   Robafen DM Cgh/Chest Congest 10-100 MG/5ML liquid Generic drug: dextromethorphan-guaiFENesin Take 5 mLs by mouth 3 (three) times daily as needed for cough.   Voltaren 1 % Gel Generic drug: diclofenac Sodium Apply 2 g topically 3 (three) times daily as needed (for pain- RIGHT ELBOW).       No Known Allergies     Procedures/Studies: DG Abdomen 1 View  Result Date: 03/18/2020 CLINICAL DATA:  Not eating. EXAM: ABDOMEN - 1 VIEW COMPARISON:  January 01, 2020 FINDINGS: The bowel gas pattern is normal. A moderate amount of  stool is seen throughout the large bowel with a large amount of stool noted within the distal sigmoid colon. No radio-opaque calculi or other significant radiographic abnormality are seen. IMPRESSION: Negative. Electronically Signed   By: Virgina Norfolk M.D.   On: 03/18/2020 15:08   CT Head Wo Contrast  Result Date: 03/18/2020 CLINICAL DATA:  Delirium, altered mental status. EXAM: CT HEAD WITHOUT CONTRAST TECHNIQUE: Contiguous  axial images were obtained from the base of the skull through the vertex without intravenous contrast. COMPARISON:  CT head 01/03/2020 FINDINGS: Brain: Interval increase in the size of a hyperdense mass in the left frontal cortex, now measuring up to 21 by 16 mm (previously 11 x 12 mm). This mass demonstrates central nonenhancement, possibly necrosis. No substantial mass effect. No midline shift. Basal cisterns are patent. Extensive white matter hypoattenuation, compatible with chronic microvascular ischemia. Advanced diffuse cerebral volume loss with ex vacuo ventricular dilation. No evidence of superimposed acute large vascular territory infarct. No hydrocephalus. No acute hemorrhage. Vascular: Calcific atherosclerosis. Skull: Normal. Negative for fracture or focal lesion. Sinuses/Orbits: The sinuses are clear. Redemonstrated depressed left orbital floor fracture, partially imaged. Other: No mastoid effusions. IMPRESSION: 1. Interval increase in the size of a hyperdense mass in the left frontal cortex, now measuring up to 21 x 16 mm (previously 11 x 12 mm). MRI with contrast could further characterize, if clinically indicated. 2. Advanced global atrophy and extensive chronic microvascular ischemic disease. Electronically Signed   By: Margaretha Sheffield MD   On: 03/18/2020 16:59   DG Chest Portable 1 View  Result Date: 03/18/2020 CLINICAL DATA:  Altered mental status. EXAM: PORTABLE CHEST 1 VIEW COMPARISON:  December 31, 2019 FINDINGS: Mildly decreased lung volumes are seen, likely secondary to the degree of patient inspiration. There is limited evaluation of the superior mediastinum secondary to positioning of the patient's head and neck. The heart size and mediastinal contours are within normal limits. Both lungs are clear. Multilevel degenerative changes seen throughout the thoracic spine. IMPRESSION: No acute or active cardiopulmonary disease. Electronically Signed   By: Virgina Norfolk M.D.   On: 03/18/2020  15:05       Subjective: No fever overnight, no further confusion.  Patient evaluated by PT today, appears at physical and mental baseline per recent admission.  No vomiting, pain complaints.    Discharge Exam: Vitals:   03/19/20 0812 03/19/20 1128  BP: (!) 150/70 (!) 95/54  Pulse:  78  Resp:  18  Temp:  (!) 97.5 F (36.4 C)  SpO2:  95%   Vitals:   03/19/20 0433 03/19/20 0752 03/19/20 0812 03/19/20 1128  BP:  (!) 176/86 (!) 150/70 (!) 95/54  Pulse:  73  78  Resp:  16  18  Temp:  (!) 97.5 F (36.4 C)  (!) 97.5 F (36.4 C)  TempSrc:  Axillary  Oral  SpO2:  100%  95%  Weight: 73.4 kg     Height:        General: Pt is  awake, interactive not in acute distress Cardiovascular: RRR, nl S1-S2, no murmurs appreciated.   No LE edema.   Respiratory: Normal respiratory rate and rhythm.  CTAB without rales or wheezes. Abdominal: Abdomen soft and non-tender.  No distension or HSM.   Neuro/Psych: Strength symmetric in upper and lower extremities but severe generalized weakness.  Judgment and insight appear severely impaired.   The results of significant diagnostics from this hospitalization (including imaging, microbiology, ancillary and laboratory) are listed below for reference.  Microbiology: Recent Results (from the past 240 hour(s))  Urine culture     Status: Abnormal (Preliminary result)   Collection Time: 03/18/20  2:22 PM   Specimen: Urine, Random  Result Value Ref Range Status   Specimen Description URINE, RANDOM  Final   Special Requests NONE  Final   Culture (A)  Final    >=100,000 COLONIES/mL GRAM POSITIVE COCCI SUSCEPTIBILITIES TO FOLLOW Performed at [facility], [address] [place], [place] [address]    Report Status PENDING  Incomplete  Blood culture (routine x 2)     Status: None (Preliminary result)   Collection Time: 03/18/20  4:44 PM   Specimen: BLOOD  Result Value Ref Range Status   Specimen Description BLOOD SITE NOT SPECIFIED  Final    Special Requests   Final    BOTTLES DRAWN AEROBIC AND ANAEROBIC Blood Culture adequate volume   Culture   Final    NO GROWTH < 12 HOURS Performed at [facility], [place] [address] [place], [place] [address]    Report Status PENDING  Incomplete  Blood culture (routine x 2)     Status: None (Preliminary result)   Collection Time: 03/18/20  5:02 PM   Specimen: BLOOD  Result Value Ref Range Status   Specimen Description BLOOD SITE NOT SPECIFIED  Final   Special Requests   Final    BOTTLES DRAWN AEROBIC AND ANAEROBIC Blood Culture adequate volume   Culture   Final    NO GROWTH < 24 HOURS Performed at [facility], [place] [address], [place], [place] [address]    Report Status PENDING  Incomplete  Respiratory Panel by RT PCR (Flu A&B, Covid) - Nasopharyngeal Swab     Status: None   Collection Time: 03/18/20  5:11 PM   Specimen: Nasopharyngeal Swab  Result Value Ref Range Status   SARS Coronavirus 2 by RT PCR NEGATIVE NEGATIVE Final    Comment: (NOTE) SARS-CoV-2 target nucleic acids are NOT DETECTED.  The SARS-CoV-2 RNA is generally detectable in upper respiratoy specimens during the acute phase of infection. The lowest concentration of SARS-CoV-2 viral copies this assay can detect is 131 copies/mL. A negative result does not preclude SARS-Cov-2 infection and should not be used as the sole basis for treatment or other patient management decisions. A negative result may occur with  improper specimen collection/handling, submission of specimen other than nasopharyngeal swab, presence of viral mutation(s) within the areas targeted by this assay, and inadequate number of viral copies (<131 copies/mL). A negative result must be combined with clinical observations, patient history, and epidemiological information. The expected result is Negative.  Fact Sheet for Patients:  PinkCheek.be  Fact Sheet for Healthcare Providers:   GravelBags.it  This test is no t yet approved or cleared by the [place] FDA and  has been authorized for detection and/or diagnosis of SARS-CoV-2 by FDA under an Emergency Use Authorization (EUA). This EUA will remain  in effect (meaning this test can be used) for the duration of the COVID-19 declaration under Section 564(b)(1) of the Act, 21 U.S.C. section 360bbb-3(b)(1), unless the authorization is terminated or revoked sooner.     Influenza A by PCR NEGATIVE NEGATIVE Final   Influenza B by PCR NEGATIVE NEGATIVE Final    Comment: (NOTE) The Xpert Xpress SARS-CoV-2/FLU/RSV assay is intended as an aid in  the diagnosis of influenza from Nasopharyngeal swab specimens and  should not be used as a sole basis for treatment. Nasal  washings and  aspirates are unacceptable for Xpert Xpress SARS-CoV-2/FLU/RSV  testing.  Fact Sheet for Patients: PinkCheek.be  Fact Sheet for Healthcare Providers: GravelBags.it  This test is not yet approved or cleared by the [place] FDA and  has been authorized for detection and/or diagnosis of SARS-CoV-2 by  FDA under an Emergency Use Authorization (EUA). This EUA will remain  in effect (meaning this test can be used) for the duration of the  Covid-19 declaration under Section 564(b)(1) of the Act, 21  U.S.C. section 360bbb-3(b)(1), unless the authorization is  terminated or revoked. Performed at [facility], [place] [address] [place], [place] [address]   MRSA PCR Screening     Status: Abnormal   Collection Time: 03/19/20  2:50 AM   Specimen: Nasal Mucosa; Nasopharyngeal  Result Value Ref Range Status   MRSA by PCR POSITIVE (A) NEGATIVE Final    Comment:        The GeneXpert MRSA Assay (FDA approved for NASAL specimens only), is one component of a comprehensive MRSA colonization surveillance program. It is not intended to diagnose  MRSA infection nor to guide or monitor treatment for MRSA infections. RESULT CALLED TO, READ BACK BY AND VERIFIED WITH: CHANEY,A RN 03/19/2020 AT 9528 SKEEN,P Performed at [facility], [place] [address], [place], [place] [address]      Labs: BNP (last 3 results) No results for input(s): BNP in the last 8760 hours. Basic Metabolic Panel: Recent Labs  Lab 03/18/20 1421 03/18/20 1900 03/19/20 0218  NA 139  --  143  K 4.8  --  4.2  CL 105  --  106  CO2 25  --  25  GLUCOSE 152*  --  159*  BUN 38*  --  35*  CREATININE 1.74* 1.69* 1.58*  CALCIUM 9.3  --  9.6  MG  --  2.0  --   PHOS  --  2.9  --    Liver Function Tests: Recent Labs  Lab 03/18/20 1421 03/19/20 0218  AST 20 18  ALT 18 17  ALKPHOS 109 113  BILITOT 1.2 1.5*  PROT 6.6 6.4*  ALBUMIN 3.6 3.5   No results for input(s): LIPASE, AMYLASE in the last 168 hours. No results for input(s): AMMONIA in the last 168 hours. CBC: Recent Labs  Lab 03/18/20 1421 03/18/20 1900 03/19/20 0218  WBC 4.9 5.4 5.2  NEUTROABS 3.3  --   --   HGB 12.7* 13.5 13.2  HCT 39.9 43.6 41.3  MCV 92.4 95.2 92.6  PLT 145* 150 157   Cardiac Enzymes: No results for input(s): CKTOTAL, CKMB, CKMBINDEX, TROPONINI in the last 168 hours. BNP: Invalid input(s): POCBNP CBG: Recent Labs  Lab 03/18/20 2245 03/18/20 2330 03/19/20 0312 03/19/20 0755 03/19/20 1128  GLUCAP 146* 135* 284* 109* 178*   D-Dimer No results for input(s): DDIMER in the last 72 hours. Hgb A1c No results for input(s): HGBA1C in the last 72 hours. Lipid Profile No results for input(s): CHOL, HDL, LDLCALC, TRIG, CHOLHDL, LDLDIRECT in the last 72 hours. Thyroid function studies Recent Labs    03/18/20 1855  TSH 4.905*   Anemia work up No results for input(s): VITAMINB12, FOLATE, FERRITIN, TIBC, IRON, RETICCTPCT in the last 72 hours. Urinalysis    Component Value Date/Time   COLORURINE YELLOW 03/18/2020 1530   APPEARANCEUR CLOUDY (A) 03/18/2020 1530    LABSPEC 1.013 03/18/2020 1530   PHURINE 5.0 03/18/2020 1530   GLUCOSEU 50 (A) 03/18/2020 1530   HGBUR  LARGE (A) 03/18/2020 1530   BILIRUBINUR NEGATIVE 03/18/2020 1530   BILIRUBINUR Negative 04/10/2017 [place] 03/18/2020 1530   PROTEINUR 100 (A) 03/18/2020 1530   UROBILINOGEN 0.2 04/10/2017 1620   UROBILINOGEN 1.0 12/21/2008 2206   NITRITE NEGATIVE 03/18/2020 1530   LEUKOCYTESUR LARGE (A) 03/18/2020 1530   Sepsis Labs Invalid input(s): PROCALCITONIN,  WBC,  LACTICIDVEN Microbiology Recent Results (from the past 240 hour(s))  Urine culture     Status: Abnormal (Preliminary result)   Collection Time: 03/18/20  2:22 PM   Specimen: Urine, Random  Result Value Ref Range Status   Specimen Description URINE, RANDOM  Final   Special Requests NONE  Final   Culture (A)  Final    >=100,000 COLONIES/mL GRAM POSITIVE COCCI SUSCEPTIBILITIES TO FOLLOW Performed at [facility], [place] [address], [place], [place] [address]    Report Status PENDING  Incomplete  Blood culture (routine x 2)     Status: None (Preliminary result)   Collection Time: 03/18/20  4:44 PM   Specimen: BLOOD  Result Value Ref Range Status   Specimen Description BLOOD SITE NOT SPECIFIED  Final   Special Requests   Final    BOTTLES DRAWN AEROBIC AND ANAEROBIC Blood Culture adequate volume   Culture   Final    NO GROWTH < 12 HOURS Performed at [facility], [place] [address] [place], [place] [address]    Report Status PENDING  Incomplete  Blood culture (routine x 2)     Status: None (Preliminary result)   Collection Time: 03/18/20  5:02 PM   Specimen: BLOOD  Result Value Ref Range Status   Specimen Description BLOOD SITE NOT SPECIFIED  Final   Special Requests   Final    BOTTLES DRAWN AEROBIC AND ANAEROBIC Blood Culture adequate volume   Culture   Final    NO GROWTH < 24 HOURS Performed at [facility], [place] [address], [place], [place] [address]    Report Status PENDING  Incomplete   Respiratory Panel by RT PCR (Flu A&B, Covid) - Nasopharyngeal Swab     Status: None   Collection Time: 03/18/20  5:11 PM   Specimen: Nasopharyngeal Swab  Result Value Ref Range Status   SARS Coronavirus 2 by RT PCR NEGATIVE NEGATIVE Final    Comment: (NOTE) SARS-CoV-2 target nucleic acids are NOT DETECTED.  The SARS-CoV-2 RNA is generally detectable in upper respiratoy specimens during the acute phase of infection. The lowest concentration of SARS-CoV-2 viral copies this assay can detect is 131 copies/mL. A negative result does not preclude SARS-Cov-2 infection and should not be used as the sole basis for treatment or other patient management decisions. A negative result may occur with  improper specimen collection/handling, submission of specimen other than nasopharyngeal swab, presence of viral mutation(s) within the areas targeted by this assay, and inadequate number of viral copies (<131 copies/mL). A negative result must be combined with clinical observations, patient history, and epidemiological information. The expected result is Negative.  Fact Sheet for Patients:  PinkCheek.be  Fact Sheet for Healthcare Providers:  GravelBags.it  This test is no t yet approved or cleared by the [place] FDA and  has been authorized for detection and/or diagnosis of SARS-CoV-2 by FDA under an Emergency Use Authorization (EUA). This EUA will remain  in effect (meaning this test can be used) for the duration of the COVID-19 declaration under Section 564(b)(1) of the Act, 21 U.S.C. section 360bbb-3(b)(1), unless the  authorization is terminated or revoked sooner.     Influenza A by PCR NEGATIVE NEGATIVE Final   Influenza B by PCR NEGATIVE NEGATIVE Final    Comment: (NOTE) The Xpert Xpress SARS-CoV-2/FLU/RSV assay is intended as an aid in  the diagnosis of influenza from Nasopharyngeal swab specimens and  should not be used as  a sole basis for treatment. Nasal washings and  aspirates are unacceptable for Xpert Xpress SARS-CoV-2/FLU/RSV  testing.  Fact Sheet for Patients: PinkCheek.be  Fact Sheet for Healthcare Providers: GravelBags.it  This test is not yet approved or cleared by the [place] FDA and  has been authorized for detection and/or diagnosis of SARS-CoV-2 by  FDA under an Emergency Use Authorization (EUA). This EUA will remain  in effect (meaning this test can be used) for the duration of the  Covid-19 declaration under Section 564(b)(1) of the Act, 21  U.S.C. section 360bbb-3(b)(1), unless the authorization is  terminated or revoked. Performed at [facility], [place] [address] [place], [place] [address]   MRSA PCR Screening     Status: Abnormal   Collection Time: 03/19/20  2:50 AM   Specimen: Nasal Mucosa; Nasopharyngeal  Result Value Ref Range Status   MRSA by PCR POSITIVE (A) NEGATIVE Final    Comment:        The GeneXpert MRSA Assay (FDA approved for NASAL specimens only), is one component of a comprehensive MRSA colonization surveillance program. It is not intended to diagnose MRSA infection nor to guide or monitor treatment for MRSA infections. RESULT CALLED TO, READ BACK BY AND VERIFIED WITH: CHANEY,A RN 03/19/2020 AT 0258 SKEEN,P Performed at [facility], [place] [address] [place], [place] [address]      Time coordinating discharge: 25 minutes The [place] controlled substances registry was reviewed for this patient       SIGNED:   Edwin Dada, MD  [facility] 03/19/2020, 11:51 AM

## 2020-03-19 NOTE — Discharge Summary (Signed)
Physician Discharge Summary  Robert Salinas. UVO:[id number] DOB: 1927/05/26 DOA: 03/18/2020  PCP: Reymundo Poll, MD  Admit date: 03/18/2020 Discharge date: 03/19/2020  Admitted From: ALF  Disposition:  ALF   Recommendations for Outpatient Follow-up:  1. Please follow with Hospice      Home Health: N/A  Equipment/Devices: N/A  Discharge Condition: Declining  CODE STATUS: DNR Diet recommendation: Liberal  Brief/Interim Summary: Robert Salinas is a 86 y.o. M with hx Alzheimer's, hypothyroidism, Afib on aspirin alone, DM, HTN and brain mass who presented for confusion.  In the ER, UA suggested new pyuria and glucoses elevated.  CT head showed increased size of brain mass.  Started on antibiotics for UTI.         PRINCIPAL HOSPITAL DIAGNOSIS: Confusion    Discharge Diagnoses:   Confusion, likely delirium  Multifactorial from dehydration, UTI,and brain mass in the setting of failure to thrive.  UTI Started on ceftriaxone in hospital.  Please complete 5 days with cephalexin.  Brain mass This was discussed with family. They have deferred work up.  At this point, the patient appears to be declining.  He likely has less than 6 months life expectancy.  I recommend Hospice enrollment and pivot to comfort cares, minimizing uncomfortable procedures, natural progression of illness.     Hypothyroidism Alzheimer's dementia Atrial fibrillation, permanent Hypertension Diabetes        Discharge Instructions   Allergies as of 03/19/2020   No Known Allergies     Medication List    TAKE these medications   acetaminophen 325 MG tablet Commonly known as: TYLENOL Take 2 tablets (650 mg total) by mouth every 4 (four) hours as needed for mild pain (or temp > 37.5 C (99.5 F)).   amLODipine 2.5 MG tablet Commonly known as: NORVASC Take 1 tablet (2.5 mg total) by mouth daily.   ammonium lactate 12 % lotion Commonly known as: LAC-HYDRIN Apply 1 application topically  See admin instructions. Apply to both feet at bedtime   cephALEXin 500 MG capsule Commonly known as: KEFLEX Take 1 capsule (500 mg total) by mouth 3 (three) times daily for 4 days.   Combivent Respimat 20-100 MCG/ACT Aers respimat Generic drug: Ipratropium-Albuterol Inhale 1 puff into the lungs in the morning and at bedtime.   glucose blood test strip Commonly known as: OneTouch Verio Use to test blood sugars daily. Dx: E11.9   lactose free nutrition Liqd Take 237 mLs by mouth 2 (two) times daily between meals.   levothyroxine 88 MCG tablet Commonly known as: SYNTHROID Take 88 mcg by mouth daily before breakfast.   montelukast 10 MG tablet Commonly known as: SINGULAIR Take 10 mg by mouth daily.   onetouch ultrasoft lancets Use to test blood sugars daily. Dx: E11.9   ProAir HFA 108 (90 Base) MCG/ACT inhaler Generic drug: albuterol Inhale 2 puffs into the lungs 4 (four) times daily as needed for wheezing or shortness of breath.   Robafen DM Cgh/Chest Congest 10-100 MG/5ML liquid Generic drug: dextromethorphan-guaiFENesin Take 5 mLs by mouth 3 (three) times daily as needed for cough.   Voltaren 1 % Gel Generic drug: diclofenac Sodium Apply 2 g topically 3 (three) times daily as needed (for pain- RIGHT ELBOW).       No Known Allergies     Procedures/Studies: DG Abdomen 1 View  Result Date: 03/18/2020 CLINICAL DATA:  Not eating. EXAM: ABDOMEN - 1 VIEW COMPARISON:  January 01, 2020 FINDINGS: The bowel gas pattern is normal. A moderate amount of  stool is seen throughout the large bowel with a large amount of stool noted within the distal sigmoid colon. No radio-opaque calculi or other significant radiographic abnormality are seen. IMPRESSION: Negative. Electronically Signed   By: Virgina Norfolk M.D.   On: 03/18/2020 15:08   CT Head Wo Contrast  Result Date: 03/18/2020 CLINICAL DATA:  Delirium, altered mental status. EXAM: CT HEAD WITHOUT CONTRAST TECHNIQUE: Contiguous  axial images were obtained from the base of the skull through the vertex without intravenous contrast. COMPARISON:  CT head 01/03/2020 FINDINGS: Brain: Interval increase in the size of a hyperdense mass in the left frontal cortex, now measuring up to 21 by 16 mm (previously 11 x 12 mm). This mass demonstrates central nonenhancement, possibly necrosis. No substantial mass effect. No midline shift. Basal cisterns are patent. Extensive white matter hypoattenuation, compatible with chronic microvascular ischemia. Advanced diffuse cerebral volume loss with ex vacuo ventricular dilation. No evidence of superimposed acute large vascular territory infarct. No hydrocephalus. No acute hemorrhage. Vascular: Calcific atherosclerosis. Skull: Normal. Negative for fracture or focal lesion. Sinuses/Orbits: The sinuses are clear. Redemonstrated depressed left orbital floor fracture, partially imaged. Other: No mastoid effusions. IMPRESSION: 1. Interval increase in the size of a hyperdense mass in the left frontal cortex, now measuring up to 21 x 16 mm (previously 11 x 12 mm). MRI with contrast could further characterize, if clinically indicated. 2. Advanced global atrophy and extensive chronic microvascular ischemic disease. Electronically Signed   By: Margaretha Sheffield MD   On: 03/18/2020 16:59   DG Chest Portable 1 View  Result Date: 03/18/2020 CLINICAL DATA:  Altered mental status. EXAM: PORTABLE CHEST 1 VIEW COMPARISON:  December 31, 2019 FINDINGS: Mildly decreased lung volumes are seen, likely secondary to the degree of patient inspiration. There is limited evaluation of the superior mediastinum secondary to positioning of the patient's head and neck. The heart size and mediastinal contours are within normal limits. Both lungs are clear. Multilevel degenerative changes seen throughout the thoracic spine. IMPRESSION: No acute or active cardiopulmonary disease. Electronically Signed   By: Virgina Norfolk M.D.   On: 03/18/2020  15:05       Subjective: No fever overnight, no further confusion.  Patient evaluated by PT today, appears at physical and mental baseline per recent admission.  No vomiting, pain complaints.    Discharge Exam: Vitals:   03/19/20 0812 03/19/20 1128  BP: (!) 150/70 (!) 95/54  Pulse:  78  Resp:  18  Temp:  (!) 97.5 F (36.4 C)  SpO2:  95%   Vitals:   03/19/20 0433 03/19/20 0752 03/19/20 0812 03/19/20 1128  BP:  (!) 176/86 (!) 150/70 (!) 95/54  Pulse:  73  78  Resp:  16  18  Temp:  (!) 97.5 F (36.4 C)  (!) 97.5 F (36.4 C)  TempSrc:  Axillary  Oral  SpO2:  100%  95%  Weight: 73.4 kg     Height:        General: Pt is  awake, interactive not in acute distress Cardiovascular: RRR, nl S1-S2, no murmurs appreciated.   No LE edema.   Respiratory: Normal respiratory rate and rhythm.  CTAB without rales or wheezes. Abdominal: Abdomen soft and non-tender.  No distension or HSM.   Neuro/Psych: Strength symmetric in upper and lower extremities but severe generalized weakness.  Judgment and insight appear severely impaired.   The results of significant diagnostics from this hospitalization (including imaging, microbiology, ancillary and laboratory) are listed below for reference.  Microbiology: Recent Results (from the past 240 hour(s))  Urine culture     Status: Abnormal (Preliminary result)   Collection Time: 03/18/20  2:22 PM   Specimen: Urine, Random  Result Value Ref Range Status   Specimen Description URINE, RANDOM  Final   Special Requests NONE  Final   Culture (A)  Final    >=100,000 COLONIES/mL GRAM POSITIVE COCCI SUSCEPTIBILITIES TO FOLLOW Performed at [facility], [address] [place], [place] [address]    Report Status PENDING  Incomplete  Blood culture (routine x 2)     Status: None (Preliminary result)   Collection Time: 03/18/20  4:44 PM   Specimen: BLOOD  Result Value Ref Range Status   Specimen Description BLOOD SITE NOT SPECIFIED  Final    Special Requests   Final    BOTTLES DRAWN AEROBIC AND ANAEROBIC Blood Culture adequate volume   Culture   Final    NO GROWTH < 12 HOURS Performed at [facility], [place] [address], [place], [place] [address]    Report Status PENDING  Incomplete  Blood culture (routine x 2)     Status: None (Preliminary result)   Collection Time: 03/18/20  5:02 PM   Specimen: BLOOD  Result Value Ref Range Status   Specimen Description BLOOD SITE NOT SPECIFIED  Final   Special Requests   Final    BOTTLES DRAWN AEROBIC AND ANAEROBIC Blood Culture adequate volume   Culture   Final    NO GROWTH < 24 HOURS Performed at [facility], [place] [address] [place], [place] [address]    Report Status PENDING  Incomplete  Respiratory Panel by RT PCR (Flu A&B, Covid) - Nasopharyngeal Swab     Status: None   Collection Time: 03/18/20  5:11 PM   Specimen: Nasopharyngeal Swab  Result Value Ref Range Status   SARS Coronavirus 2 by RT PCR NEGATIVE NEGATIVE Final    Comment: (NOTE) SARS-CoV-2 target nucleic acids are NOT DETECTED.  The SARS-CoV-2 RNA is generally detectable in upper respiratoy specimens during the acute phase of infection. The lowest concentration of SARS-CoV-2 viral copies this assay can detect is 131 copies/mL. A negative result does not preclude SARS-Cov-2 infection and should not be used as the sole basis for treatment or other patient management decisions. A negative result may occur with  improper specimen collection/handling, submission of specimen other than nasopharyngeal swab, presence of viral mutation(s) within the areas targeted by this assay, and inadequate number of viral copies (<131 copies/mL). A negative result must be combined with clinical observations, patient history, and epidemiological information. The expected result is Negative.  Fact Sheet for Patients:  PinkCheek.be  Fact Sheet for Healthcare Providers:   GravelBags.it  This test is no t yet approved or cleared by the [place] FDA and  has been authorized for detection and/or diagnosis of SARS-CoV-2 by FDA under an Emergency Use Authorization (EUA). This EUA will remain  in effect (meaning this test can be used) for the duration of the COVID-19 declaration under Section 564(b)(1) of the Act, 21 U.S.C. section 360bbb-3(b)(1), unless the authorization is terminated or revoked sooner.     Influenza A by PCR NEGATIVE NEGATIVE Final   Influenza B by PCR NEGATIVE NEGATIVE Final    Comment: (NOTE) The Xpert Xpress SARS-CoV-2/FLU/RSV assay is intended as an aid in  the diagnosis of influenza from Nasopharyngeal swab specimens and  should not be used as a sole basis for treatment. Nasal  washings and  aspirates are unacceptable for Xpert Xpress SARS-CoV-2/FLU/RSV  testing.  Fact Sheet for Patients: PinkCheek.be  Fact Sheet for Healthcare Providers: GravelBags.it  This test is not yet approved or cleared by the [place] FDA and  has been authorized for detection and/or diagnosis of SARS-CoV-2 by  FDA under an Emergency Use Authorization (EUA). This EUA will remain  in effect (meaning this test can be used) for the duration of the  Covid-19 declaration under Section 564(b)(1) of the Act, 21  U.S.C. section 360bbb-3(b)(1), unless the authorization is  terminated or revoked. Performed at [facility], [place] [address], [place], [place] [address]   MRSA PCR Screening     Status: Abnormal   Collection Time: 03/19/20  2:50 AM   Specimen: Nasal Mucosa; Nasopharyngeal  Result Value Ref Range Status   MRSA by PCR POSITIVE (A) NEGATIVE Final    Comment:        The GeneXpert MRSA Assay (FDA approved for NASAL specimens only), is one component of a comprehensive MRSA colonization surveillance program. It is not intended to diagnose  MRSA infection nor to guide or monitor treatment for MRSA infections. RESULT CALLED TO, READ BACK BY AND VERIFIED WITH: CHANEY,A RN 03/19/2020 AT 2683 SKEEN,P Performed at [facility], [place] [address] [place], [place] [address]      Labs: BNP (last 3 results) No results for input(s): BNP in the last 8760 hours. Basic Metabolic Panel: Recent Labs  Lab 03/18/20 1421 03/18/20 1900 03/19/20 0218  NA 139  --  143  K 4.8  --  4.2  CL 105  --  106  CO2 25  --  25  GLUCOSE 152*  --  159*  BUN 38*  --  35*  CREATININE 1.74* 1.69* 1.58*  CALCIUM 9.3  --  9.6  MG  --  2.0  --   PHOS  --  2.9  --    Liver Function Tests: Recent Labs  Lab 03/18/20 1421 03/19/20 0218  AST 20 18  ALT 18 17  ALKPHOS 109 113  BILITOT 1.2 1.5*  PROT 6.6 6.4*  ALBUMIN 3.6 3.5   No results for input(s): LIPASE, AMYLASE in the last 168 hours. No results for input(s): AMMONIA in the last 168 hours. CBC: Recent Labs  Lab 03/18/20 1421 03/18/20 1900 03/19/20 0218  WBC 4.9 5.4 5.2  NEUTROABS 3.3  --   --   HGB 12.7* 13.5 13.2  HCT 39.9 43.6 41.3  MCV 92.4 95.2 92.6  PLT 145* 150 157   Cardiac Enzymes: No results for input(s): CKTOTAL, CKMB, CKMBINDEX, TROPONINI in the last 168 hours. BNP: Invalid input(s): POCBNP CBG: Recent Labs  Lab 03/18/20 2245 03/18/20 2330 03/19/20 0312 03/19/20 0755 03/19/20 1128  GLUCAP 146* 135* 284* 109* 178*   D-Dimer No results for input(s): DDIMER in the last 72 hours. Hgb A1c No results for input(s): HGBA1C in the last 72 hours. Lipid Profile No results for input(s): CHOL, HDL, LDLCALC, TRIG, CHOLHDL, LDLDIRECT in the last 72 hours. Thyroid function studies Recent Labs    03/18/20 1855  TSH 4.905*   Anemia work up No results for input(s): VITAMINB12, FOLATE, FERRITIN, TIBC, IRON, RETICCTPCT in the last 72 hours. Urinalysis    Component Value Date/Time   COLORURINE YELLOW 03/18/2020 1530   APPEARANCEUR CLOUDY (A) 03/18/2020 1530    LABSPEC 1.013 03/18/2020 1530   PHURINE 5.0 03/18/2020 1530   GLUCOSEU 50 (A) 03/18/2020 1530   HGBUR  LARGE (A) 03/18/2020 1530   BILIRUBINUR NEGATIVE 03/18/2020 1530   BILIRUBINUR Negative 04/10/2017 Sheffield 03/18/2020 1530   PROTEINUR 100 (A) 03/18/2020 1530   UROBILINOGEN 0.2 04/10/2017 1620   UROBILINOGEN 1.0 12/21/2008 2206   NITRITE NEGATIVE 03/18/2020 1530   LEUKOCYTESUR LARGE (A) 03/18/2020 1530   Sepsis Labs Invalid input(s): PROCALCITONIN,  WBC,  LACTICIDVEN Microbiology Recent Results (from the past 240 hour(s))  Urine culture     Status: Abnormal (Preliminary result)   Collection Time: 03/18/20  2:22 PM   Specimen: Urine, Random  Result Value Ref Range Status   Specimen Description URINE, RANDOM  Final   Special Requests NONE  Final   Culture (A)  Final    >=100,000 COLONIES/mL GRAM POSITIVE COCCI SUSCEPTIBILITIES TO FOLLOW Performed at [facility], [place] [address], [place], [place] [address]    Report Status PENDING  Incomplete  Blood culture (routine x 2)     Status: None (Preliminary result)   Collection Time: 03/18/20  4:44 PM   Specimen: BLOOD  Result Value Ref Range Status   Specimen Description BLOOD SITE NOT SPECIFIED  Final   Special Requests   Final    BOTTLES DRAWN AEROBIC AND ANAEROBIC Blood Culture adequate volume   Culture   Final    NO GROWTH < 12 HOURS Performed at [facility], [place] [address], [place], [place] [address]    Report Status PENDING  Incomplete  Blood culture (routine x 2)     Status: None (Preliminary result)   Collection Time: 03/18/20  5:02 PM   Specimen: BLOOD  Result Value Ref Range Status   Specimen Description BLOOD SITE NOT SPECIFIED  Final   Special Requests   Final    BOTTLES DRAWN AEROBIC AND ANAEROBIC Blood Culture adequate volume   Culture   Final    NO GROWTH < 24 HOURS Performed at [facility], [place] [address] [place],  [address]    Report Status PENDING  Incomplete   Respiratory Panel by RT PCR (Flu A&B, Covid) - Nasopharyngeal Swab     Status: None   Collection Time: 03/18/20  5:11 PM   Specimen: Nasopharyngeal Swab  Result Value Ref Range Status   SARS Coronavirus 2 by RT PCR NEGATIVE NEGATIVE Final    Comment: (NOTE) SARS-CoV-2 target nucleic acids are NOT DETECTED.  The SARS-CoV-2 RNA is generally detectable in upper respiratoy specimens during the acute phase of infection. The lowest concentration of SARS-CoV-2 viral copies this assay can detect is 131 copies/mL. A negative result does not preclude SARS-Cov-2 infection and should not be used as the sole basis for treatment or other patient management decisions. A negative result may occur with  improper specimen collection/handling, submission of specimen other than nasopharyngeal swab, presence of viral mutation(s) within the areas targeted by this assay, and inadequate number of viral copies (<131 copies/mL). A negative result must be combined with clinical observations, patient history, and epidemiological information. The expected result is Negative.  Fact Sheet for Patients:  PinkCheek.be  Fact Sheet for Healthcare Providers:  GravelBags.it  This test is no t yet approved or cleared by the [place] FDA and  has been authorized for detection and/or diagnosis of SARS-CoV-2 by FDA under an Emergency Use Authorization (EUA). This EUA will remain  in effect (meaning this test can be used) for the duration of the COVID-19 declaration under Section 564(b)(1) of the Act, 21 U.S.C. section 360bbb-3(b)(1), unless the  authorization is terminated or revoked sooner.     Influenza A by PCR NEGATIVE NEGATIVE Final   Influenza B by PCR NEGATIVE NEGATIVE Final    Comment: (NOTE) The Xpert Xpress SARS-CoV-2/FLU/RSV assay is intended as an aid in  the diagnosis of influenza from Nasopharyngeal swab specimens and  should not be used as  a sole basis for treatment. Nasal washings and  aspirates are unacceptable for Xpert Xpress SARS-CoV-2/FLU/RSV  testing.  Fact Sheet for Patients: PinkCheek.be  Fact Sheet for Healthcare Providers: GravelBags.it  This test is not yet approved or cleared by the [place] FDA and  has been authorized for detection and/or diagnosis of SARS-CoV-2 by  FDA under an Emergency Use Authorization (EUA). This EUA will remain  in effect (meaning this test can be used) for the duration of the  Covid-19 declaration under Section 564(b)(1) of the Act, 21  U.S.C. section 360bbb-3(b)(1), unless the authorization is  terminated or revoked. Performed at [facility], [place] [address], [place], [place] [address]   MRSA PCR Screening     Status: Abnormal   Collection Time: 03/19/20  2:50 AM   Specimen: Nasal Mucosa; Nasopharyngeal  Result Value Ref Range Status   MRSA by PCR POSITIVE (A) NEGATIVE Final    Comment:        The GeneXpert MRSA Assay (FDA approved for NASAL specimens only), is one component of a comprehensive MRSA colonization surveillance program. It is not intended to diagnose MRSA infection nor to guide or monitor treatment for MRSA infections. RESULT CALLED TO, READ BACK BY AND VERIFIED WITH: CHANEY,A RN 03/19/2020 AT 9323 SKEEN,P Performed at [facility], [place] [address], [place], [place] [address]      Time coordinating discharge: 25 minutes The [place] controlled substances registry was reviewed for this patient       SIGNED:   Edwin Dada, MD  [facility] 03/19/2020, 11:51 AM

## 2020-03-19 NOTE — Evaluation (Signed)
Occupational Therapy Evaluation Patient Details Name: Robert Salinas. MRN: [id number] DOB: 11-02-1926 Today's Date: 03/19/2020    History of Present Illness Patient is a 86 y/o male who presents with AMS. Found to have UTI. PMH includes Alzheimer's dementia, HTN, A-fib, CKD, DM2 and brain mass.   Clinical Impression   Pt admitted with the above diagnoses and presents with below problem list. Pt will benefit from continued acute OT to address the below listed deficits and maximize independence with basic ADLs prior to d/c back to SNF. Per chart review, at baseline pt resides in memory care unit, is w/c bound and does not ambulate; assist with all ADLs. Pt able to partial stand with max A +1. Suspect he is close to baseline with assist level for ADLs. Per notes, plan is for pt to return to [facility] on hospice care. Does not require skilled therapy services. Will sign off/discharge from acute OT services.     Follow Up Recommendations  Other (comment);Supervision/Assistance - 24 hour (back to [facility] )    Equipment Recommendations  None recommended by OT    Recommendations for Other Services       Precautions / Restrictions Precautions Precautions: Fall Restrictions Weight Bearing Restrictions: No      Mobility Bed Mobility Overal bed mobility: Needs Assistance Bed Mobility: Supine to Sit;Sit to Supine     Supine to sit: Max assist;HOB elevated Sit to supine: Min assist   General bed mobility comments: Assist to bring LEs to EOB and to elevate trunk as pt trying to cover self up in blankets. Able to bring LEs into bed and assist to lower trunk to return to supine.  Transfers Overall transfer level: Needs assistance Equipment used: None Transfers: Sit to/from Stand Sit to Stand: Max assist         General transfer comment: Assist to power to standing; flexed hip/knees bilaterally, limited standing.    Balance Overall balance assessment: Needs  assistance Sitting-balance support: Feet unsupported;No upper extremity supported Sitting balance-Leahy Scale: Poor Sitting balance - Comments: Min A for sitting EOB; trying to climb back into bed throughout session.   Standing balance support: During functional activity Standing balance-Leahy Scale: Zero Standing balance comment: Not able to stand fully upright despite max A, flexed posture overall.                           ADL either performed or assessed with clinical judgement   ADL Overall ADL's : Needs assistance/impaired Eating/Feeding: Minimal assistance Eating/Feeding Details (indicate cue type and reason): able to bring cup to mouth to drink with min guard assist. suspect some baseline assist. Grooming: Maximal assistance   Upper Body Bathing: Total assistance   Lower Body Bathing: Total assistance;Maximal assistance   Upper Body Dressing : Maximal assistance;Total assistance   Lower Body Dressing: Maximal assistance;Total assistance                 General ADL Comments: suspect he is close to baseline with assist level for ADLs.     Vision         Perception     Praxis      Pertinent Vitals/Pain Pain Assessment: Faces Faces Pain Scale: No hurt     Hand Dominance Right   Extremity/Trunk Assessment Upper Extremity Assessment Upper Extremity Assessment: Generalized weakness;Difficult to assess due to impaired cognition   Lower Extremity Assessment Lower Extremity Assessment: Defer to PT evaluation RLE Deficits /  Details: limited knee extension AROM LLE Deficits / Details: limited knee extension AROM   Cervical / Trunk Assessment Cervical / Trunk Assessment: Kyphotic   Communication Communication Communication: HOH   Cognition Arousal/Alertness: Awake/alert Behavior During Therapy: WFL for tasks assessed/performed;Restless Overall Cognitive Status: No family/caregiver present to determine baseline cognitive functioning                                  General Comments: HOH; Requires repetition to follow commands inconsistently. Oriented to self only. LIkely baseline cognition but difficult to assess. Hx of Alzhemier's dementia.   General Comments       Exercises     Shoulder Instructions      Home Living Family/patient expects to be discharged to:: Other (Comment)                                 Additional Comments: appears patient is from [facility] and could return      Prior Functioning/Environment Level of Independence: Needs assistance  Gait / Transfers Assistance Needed: Per chart, pt is non ambulatory and uses w/c for mobility. ADL's / Homemaking Assistance Needed: from memory care unit; assist for all/most ADLs at baseline   Comments: Pt not the best historian due to [facility] so not able to get more details regarding mobility.        OT Problem List:        OT Treatment/Interventions:      OT Goals(Current goals can be found in the care plan section) Acute Rehab OT Goals Patient Stated Goal: get covered in blankets  OT Frequency:     Barriers to D/C:            Co-evaluation PT/OT/SLP Co-Evaluation/Treatment: Yes Reason for Co-Treatment: Necessary to address cognition/behavior during functional activity;For patient/therapist safety;To address functional/ADL transfers PT goals addressed during session: Mobility/safety with mobility;Balance OT goals addressed during session: ADL's and self-care      AM-PAC OT "6 Clicks" Daily Activity     Outcome Measure Help from another person eating meals?: A Lot Help from another person taking care of personal grooming?: A Lot Help from another person toileting, which includes using toliet, bedpan, or urinal?: Total Help from another person bathing (including washing, rinsing, drying)?: Total Help from another person to put on and taking off regular upper body clothing?: A Lot Help from another person  to put on and taking off regular lower body clothing?: Total 6 Click Score: 9   End of Session    Activity Tolerance: Patient tolerated treatment well Patient left: in bed;with call bell/phone within reach;with bed alarm set  OT Visit Diagnosis: Muscle weakness (generalized) (M62.81);Unsteadiness on feet (R26.81);Other symptoms and signs involving cognitive function                Time: 4037-0964 OT Time Calculation (min): 10 min Charges:  OT General Charges $OT Visit: 1 Visit OT Evaluation $OT Eval Low Complexity: Percival, OT Acute Rehabilitation Services Pager: [phone] Office: [phone]   Hortencia Pilar 03/19/2020, 12:58 PM

## 2020-03-19 NOTE — Evaluation (Signed)
Clinical/Bedside Swallow Evaluation Patient Details  Name: Robert Salinas. MRN: [id number] Date of Birth: 05-Nov-1926  Today's Date: 03/19/2020 Time: SLP Start Time (ACUTE ONLY): 0915 SLP Stop Time (ACUTE ONLY): 8676 SLP Time Calculation (min) (ACUTE ONLY): 23 min  Past Medical History:  Past Medical History:  Diagnosis Date  . Alzheimer disease ([place])   . Atrial fibrillation ([place])   . BPH (benign prostatic hyperplasia)   . Chickenpox   . Chronic kidney disease   . COLONIC POLYPS, HX OF 06/09/2009  . Dementia ([place])   . Diabetes mellitus   . Hx of colonic polyps   . Hyperlipidemia   . Hypertension   . Hypothyroidism   . Hypothyroidism   . Thrombocytopenia ([place])    Past Surgical History:  Past Surgical History:  Procedure Laterality Date  . APPENDECTOMY    . PROSTATECTOMY     BPH cause?   HPI:  Robert Salinas. is a 86 y.o. male with medical history significant of Alzheimer's dementia, brain mass presents to emergency department for evaluation of altered mental status.  Treated for UTI in ER.    Assessment / Plan / Recommendation Clinical Impression   Pt presents with sporadic immediate coughing only with dry solid textures.  No overt s/s of aspiration were evident with purees or thin liquids via straw.  Oral phase was automatic and effective for containing and clearing boluses from the oral cavity.  Recommend a regular diet and thin liquids with close intermittent supervision due to cognitive deficits.  Meds may be administered as tolerated (whole vs crushed, with liquids vs purees).   SLP Visit Diagnosis: Dysphagia, unspecified (R13.10)    Aspiration Risk  Moderate aspiration risk    Diet Recommendation Regular;Thin liquid   Liquid Administration via: Cup;Straw Medication Administration: Other (Comment) (as tolerated) Supervision: Intermittent supervision to cue for compensatory strategies (close intermittent) Compensations: Minimize environmental  distractions;Slow rate;Small sips/bites Postural Changes: Seated upright at 90 degrees;Remain upright for at least 30 minutes after po intake    Other  Recommendations Oral Care Recommendations: Oral care BID   Follow up Recommendations None      Frequency and Duration min 1 x/week          Prognosis Prognosis for Safe Diet Advancement: Good Barriers to Reach Goals: Cognitive deficits      Swallow Study   General Date of Onset: 03/18/20 HPI: Robert Salinas. is a 86 y.o. male with medical history significant of Alzheimer's dementia, brain mass presents to emergency department for evaluation of altered mental status.  Treated for UTI in ER.  Type of Study: Bedside Swallow Evaluation Previous Swallow Assessment: 12/2019 Diet Prior to this Study: NPO Temperature Spikes Noted: No Respiratory Status: Room air History of Recent Intubation: No Behavior/Cognition: Alert;Confused;Cooperative;Requires cueing Oral Cavity Assessment: Within Functional Limits Oral Cavity - Dentition: Other (Comment) (difficult to assess as pt does not follow commands) Vision: Functional for self-feeding Self-Feeding Abilities: Able to feed self;Needs set up Patient Positioning: Upright in bed Baseline Vocal Quality: Normal Volitional Cough: Cognitively unable to elicit Volitional Swallow: Unable to elicit    Oral/Motor/Sensory Function Overall Oral Motor/Sensory Function: Within functional limits   Ice Chips     Thin Liquid Thin Liquid: Within functional limits    Nectar Thick     Honey Thick     Puree Puree: Within functional limits   Solid     Solid: Impaired Pharyngeal Phase Impairments: Cough - Immediate      Nickolas Chalfin, Elmyra Ricks  L 03/19/2020,9:51 AM

## 2020-03-20 LAB — URINE CULTURE: Culture: >=100000 — AB

## 2020-03-23 LAB — CULTURE, BLOOD (ROUTINE X 2)
Culture: NO GROWTH
Culture: NO GROWTH
Special Requests: ADEQUATE
Special Requests: ADEQUATE

## 2020-04-12 DIAGNOSIS — Z7189 Other specified counseling: Secondary | ICD-10-CM

## 2020-04-12 DIAGNOSIS — Z515 Encounter for palliative care: Secondary | ICD-10-CM

## 2020-07-20 DEATH — deceased
